# Patient Record
Sex: Female | Born: 1970 | Race: Black or African American | Hispanic: No | Marital: Married | Smoking: Never smoker
Health system: Southern US, Community
[De-identification: ages and names within clinical notes are randomized; demographics above are authoritative.]

## PROBLEM LIST (undated history)

## (undated) DIAGNOSIS — E739 Lactose intolerance, unspecified: Secondary | ICD-10-CM

## (undated) DIAGNOSIS — S82899A Other fracture of unspecified lower leg, initial encounter for closed fracture: Secondary | ICD-10-CM

## (undated) DIAGNOSIS — M7989 Other specified soft tissue disorders: Secondary | ICD-10-CM

## (undated) DIAGNOSIS — Z780 Asymptomatic menopausal state: Secondary | ICD-10-CM

## (undated) DIAGNOSIS — K219 Gastro-esophageal reflux disease without esophagitis: Secondary | ICD-10-CM

## (undated) DIAGNOSIS — R002 Palpitations: Secondary | ICD-10-CM

## (undated) DIAGNOSIS — F329 Major depressive disorder, single episode, unspecified: Secondary | ICD-10-CM

## (undated) DIAGNOSIS — R7989 Other specified abnormal findings of blood chemistry: Secondary | ICD-10-CM

## (undated) DIAGNOSIS — F419 Anxiety disorder, unspecified: Secondary | ICD-10-CM

## (undated) DIAGNOSIS — N898 Other specified noninflammatory disorders of vagina: Secondary | ICD-10-CM

## (undated) DIAGNOSIS — T380X5A Adverse effect of glucocorticoids and synthetic analogues, initial encounter: Secondary | ICD-10-CM

## (undated) DIAGNOSIS — J329 Chronic sinusitis, unspecified: Secondary | ICD-10-CM

## (undated) DIAGNOSIS — J189 Pneumonia, unspecified organism: Secondary | ICD-10-CM

## (undated) DIAGNOSIS — E669 Obesity, unspecified: Secondary | ICD-10-CM

## (undated) DIAGNOSIS — R1310 Dysphagia, unspecified: Secondary | ICD-10-CM

## (undated) DIAGNOSIS — Z91018 Allergy to other foods: Secondary | ICD-10-CM

## (undated) DIAGNOSIS — G43909 Migraine, unspecified, not intractable, without status migrainosus: Secondary | ICD-10-CM

## (undated) DIAGNOSIS — F32A Depression, unspecified: Secondary | ICD-10-CM

## (undated) DIAGNOSIS — R0602 Shortness of breath: Secondary | ICD-10-CM

## (undated) DIAGNOSIS — M1990 Unspecified osteoarthritis, unspecified site: Secondary | ICD-10-CM

## (undated) DIAGNOSIS — R7303 Prediabetes: Secondary | ICD-10-CM

## (undated) DIAGNOSIS — R6882 Decreased libido: Secondary | ICD-10-CM

## (undated) DIAGNOSIS — K5900 Constipation, unspecified: Secondary | ICD-10-CM

## (undated) DIAGNOSIS — I10 Essential (primary) hypertension: Secondary | ICD-10-CM

## (undated) DIAGNOSIS — R739 Hyperglycemia, unspecified: Secondary | ICD-10-CM

## (undated) HISTORY — DX: Dysphagia, unspecified: R13.10

## (undated) HISTORY — DX: Lactose intolerance, unspecified: E73.9

## (undated) HISTORY — DX: Other specified soft tissue disorders: M79.89

## (undated) HISTORY — DX: Essential (primary) hypertension: I10

## (undated) HISTORY — DX: Obesity, unspecified: E66.9

## (undated) HISTORY — DX: Palpitations: R00.2

## (undated) HISTORY — DX: Constipation, unspecified: K59.00

## (undated) HISTORY — DX: Decreased libido: R68.82

## (undated) HISTORY — DX: Shortness of breath: R06.02

## (undated) HISTORY — DX: Prediabetes: R73.03

## (undated) HISTORY — DX: Chronic sinusitis, unspecified: J32.9

## (undated) HISTORY — DX: Allergy to other foods: Z91.018

## (undated) HISTORY — DX: Other specified noninflammatory disorders of vagina: N89.8

## (undated) HISTORY — DX: Asymptomatic menopausal state: Z78.0

## (undated) HISTORY — PX: WISDOM TOOTH EXTRACTION: SHX21

---

## 1999-09-11 ENCOUNTER — Other Ambulatory Visit: Admission: RE | Admit: 1999-09-11 | Discharge: 1999-09-11 | Payer: Self-pay | Admitting: Family Medicine

## 1999-09-30 ENCOUNTER — Encounter: Admission: RE | Admit: 1999-09-30 | Discharge: 1999-12-29 | Payer: Self-pay | Admitting: Family Medicine

## 2000-04-27 ENCOUNTER — Encounter: Payer: Self-pay | Admitting: Family Medicine

## 2000-04-27 ENCOUNTER — Ambulatory Visit (HOSPITAL_COMMUNITY): Admission: RE | Admit: 2000-04-27 | Discharge: 2000-04-27 | Payer: Self-pay | Admitting: Family Medicine

## 2001-01-05 ENCOUNTER — Other Ambulatory Visit: Admission: RE | Admit: 2001-01-05 | Discharge: 2001-01-05 | Payer: Self-pay | Admitting: Obstetrics and Gynecology

## 2003-07-31 ENCOUNTER — Emergency Department (HOSPITAL_COMMUNITY): Admission: EM | Admit: 2003-07-31 | Discharge: 2003-07-31 | Payer: Self-pay | Admitting: *Deleted

## 2004-02-18 ENCOUNTER — Inpatient Hospital Stay (HOSPITAL_COMMUNITY): Admission: AD | Admit: 2004-02-18 | Discharge: 2004-02-18 | Payer: Self-pay | Admitting: Gynecology

## 2004-10-02 ENCOUNTER — Inpatient Hospital Stay (HOSPITAL_COMMUNITY): Admission: AD | Admit: 2004-10-02 | Discharge: 2004-10-07 | Payer: Self-pay | Admitting: Obstetrics and Gynecology

## 2004-10-04 ENCOUNTER — Encounter (INDEPENDENT_AMBULATORY_CARE_PROVIDER_SITE_OTHER): Payer: Self-pay | Admitting: *Deleted

## 2004-10-08 ENCOUNTER — Encounter: Admission: RE | Admit: 2004-10-08 | Discharge: 2004-11-07 | Payer: Self-pay | Admitting: Obstetrics and Gynecology

## 2005-10-25 ENCOUNTER — Emergency Department (HOSPITAL_COMMUNITY): Admission: EM | Admit: 2005-10-25 | Discharge: 2005-10-25 | Payer: Self-pay | Admitting: Emergency Medicine

## 2006-10-06 DIAGNOSIS — S82899A Other fracture of unspecified lower leg, initial encounter for closed fracture: Secondary | ICD-10-CM

## 2006-10-06 HISTORY — DX: Other fracture of unspecified lower leg, initial encounter for closed fracture: S82.899A

## 2006-10-29 ENCOUNTER — Emergency Department (HOSPITAL_COMMUNITY): Admission: EM | Admit: 2006-10-29 | Discharge: 2006-10-29 | Payer: Self-pay | Admitting: Emergency Medicine

## 2007-08-19 ENCOUNTER — Encounter: Admission: RE | Admit: 2007-08-19 | Discharge: 2007-08-19 | Payer: Self-pay | Admitting: Family Medicine

## 2008-07-31 ENCOUNTER — Inpatient Hospital Stay (HOSPITAL_COMMUNITY): Admission: AD | Admit: 2008-07-31 | Discharge: 2008-08-03 | Payer: Self-pay | Admitting: Family Medicine

## 2008-10-16 DIAGNOSIS — J309 Allergic rhinitis, unspecified: Secondary | ICD-10-CM | POA: Insufficient documentation

## 2008-10-16 DIAGNOSIS — J45909 Unspecified asthma, uncomplicated: Secondary | ICD-10-CM | POA: Insufficient documentation

## 2008-10-17 ENCOUNTER — Ambulatory Visit: Payer: Self-pay | Admitting: Critical Care Medicine

## 2008-10-17 DIAGNOSIS — I10 Essential (primary) hypertension: Secondary | ICD-10-CM | POA: Insufficient documentation

## 2008-10-19 ENCOUNTER — Ambulatory Visit: Payer: Self-pay | Admitting: Internal Medicine

## 2008-10-20 LAB — CONVERTED CEMR LAB: IgE (Immunoglobulin E), Serum: 765.7 intl units/mL — ABNORMAL HIGH (ref 0.0–180.0)

## 2008-10-24 ENCOUNTER — Encounter: Payer: Self-pay | Admitting: Critical Care Medicine

## 2008-10-30 ENCOUNTER — Ambulatory Visit: Payer: Self-pay | Admitting: Critical Care Medicine

## 2008-11-02 ENCOUNTER — Encounter: Payer: Self-pay | Admitting: Critical Care Medicine

## 2008-12-15 ENCOUNTER — Ambulatory Visit: Payer: Self-pay | Admitting: Critical Care Medicine

## 2008-12-26 ENCOUNTER — Encounter: Payer: Self-pay | Admitting: Critical Care Medicine

## 2008-12-27 ENCOUNTER — Encounter: Payer: Self-pay | Admitting: Critical Care Medicine

## 2009-01-22 ENCOUNTER — Encounter: Payer: Self-pay | Admitting: Critical Care Medicine

## 2009-01-24 ENCOUNTER — Encounter: Payer: Self-pay | Admitting: Critical Care Medicine

## 2009-02-02 ENCOUNTER — Telehealth: Payer: Self-pay | Admitting: Critical Care Medicine

## 2009-02-22 ENCOUNTER — Ambulatory Visit: Payer: Self-pay | Admitting: Critical Care Medicine

## 2009-02-22 DIAGNOSIS — J45909 Unspecified asthma, uncomplicated: Secondary | ICD-10-CM | POA: Insufficient documentation

## 2009-03-09 ENCOUNTER — Ambulatory Visit: Payer: Self-pay | Admitting: Critical Care Medicine

## 2009-03-23 ENCOUNTER — Ambulatory Visit: Payer: Self-pay | Admitting: Critical Care Medicine

## 2009-04-06 ENCOUNTER — Ambulatory Visit: Payer: Self-pay | Admitting: Critical Care Medicine

## 2009-04-17 ENCOUNTER — Emergency Department (HOSPITAL_COMMUNITY): Admission: EM | Admit: 2009-04-17 | Discharge: 2009-04-17 | Payer: Self-pay | Admitting: Emergency Medicine

## 2009-04-17 ENCOUNTER — Telehealth: Payer: Self-pay | Admitting: Internal Medicine

## 2009-04-18 ENCOUNTER — Ambulatory Visit: Payer: Self-pay | Admitting: Critical Care Medicine

## 2009-04-24 ENCOUNTER — Ambulatory Visit: Payer: Self-pay | Admitting: Critical Care Medicine

## 2009-04-24 ENCOUNTER — Encounter: Payer: Self-pay | Admitting: Adult Health

## 2009-05-16 ENCOUNTER — Ambulatory Visit: Payer: Self-pay | Admitting: Critical Care Medicine

## 2009-06-01 ENCOUNTER — Ambulatory Visit: Payer: Self-pay | Admitting: Critical Care Medicine

## 2009-09-26 ENCOUNTER — Telehealth (INDEPENDENT_AMBULATORY_CARE_PROVIDER_SITE_OTHER): Payer: Self-pay | Admitting: *Deleted

## 2009-10-11 ENCOUNTER — Encounter (INDEPENDENT_AMBULATORY_CARE_PROVIDER_SITE_OTHER): Payer: Self-pay | Admitting: *Deleted

## 2009-11-23 ENCOUNTER — Ambulatory Visit: Payer: Self-pay | Admitting: Critical Care Medicine

## 2009-12-25 ENCOUNTER — Telehealth (INDEPENDENT_AMBULATORY_CARE_PROVIDER_SITE_OTHER): Payer: Self-pay | Admitting: *Deleted

## 2010-03-04 ENCOUNTER — Ambulatory Visit: Payer: Self-pay | Admitting: Critical Care Medicine

## 2010-03-04 DIAGNOSIS — J018 Other acute sinusitis: Secondary | ICD-10-CM | POA: Insufficient documentation

## 2010-03-06 ENCOUNTER — Telehealth (INDEPENDENT_AMBULATORY_CARE_PROVIDER_SITE_OTHER): Payer: Self-pay | Admitting: *Deleted

## 2010-03-19 ENCOUNTER — Ambulatory Visit: Payer: Self-pay | Admitting: Critical Care Medicine

## 2010-05-10 ENCOUNTER — Ambulatory Visit: Payer: Self-pay | Admitting: Internal Medicine

## 2010-05-20 IMAGING — CT CT PARANASAL SINUSES LIMITED
1 of 3 series · 13 of 30 positions shown, 17 images · non-contrast
Comparison: None

CLINICAL DATA: Recurrent sinus infection

CT LIMITED SINUSES WITHOUT CONTRAST
TECHNIQUE: Multidetector CT images of the paranasal sinuses were
obtained in a single plane without contrast.

[Series 7: ltd sinus 3.0 h30s · axial · 0.29mm/px · z∈[-100,-5]mm · 13 of 24 slices shown, 17 images]
[im 2/24  brain]
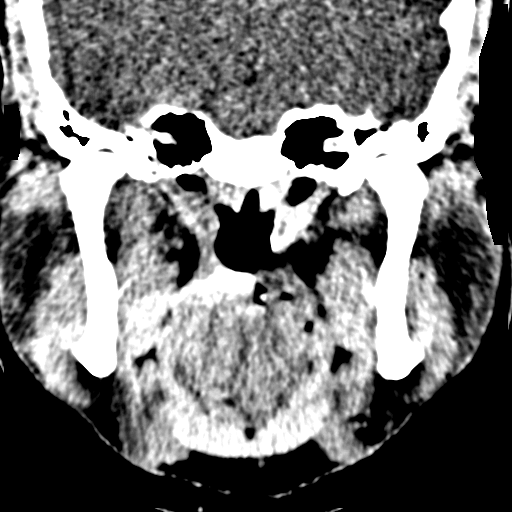
[im 2/24  bone]
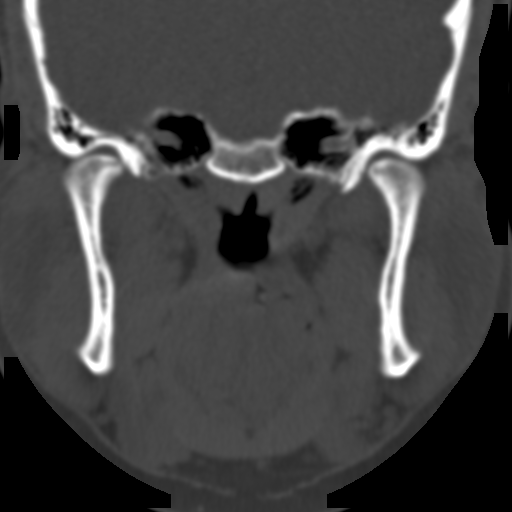
[im 4/24  bone]
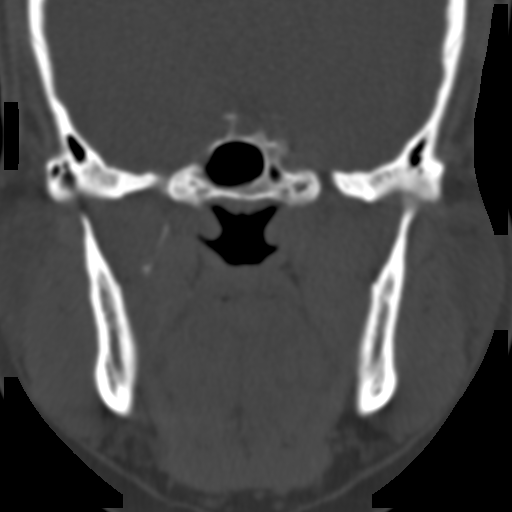
[im 5/24  bone]
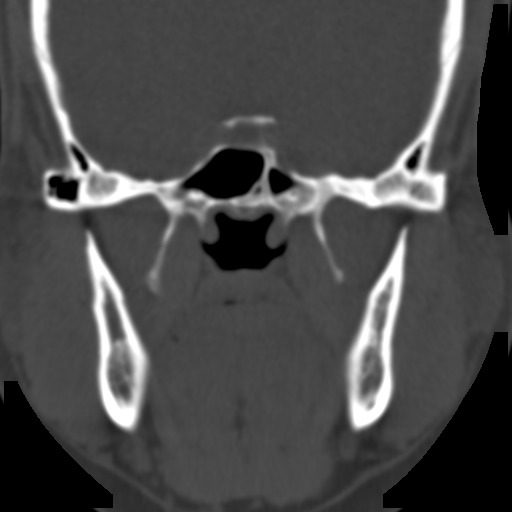
[im 7/24  bone]
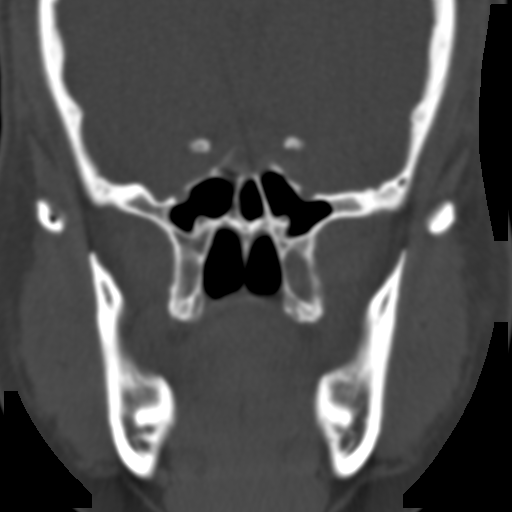
[im 9/24  brain]
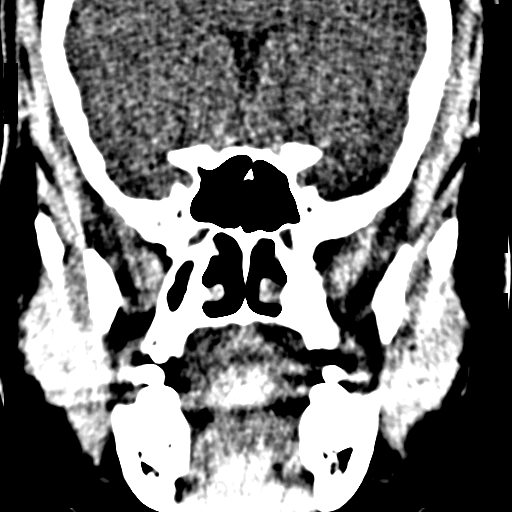
[im 9/24  bone]
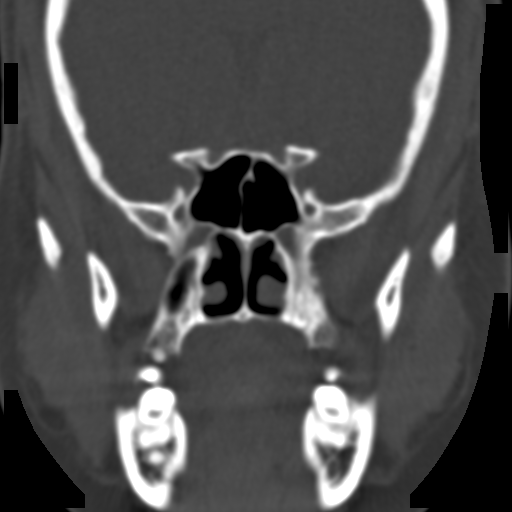
[im 10/24  bone]
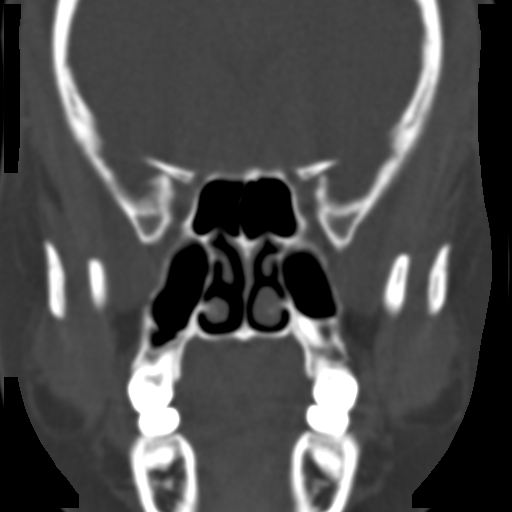
[im 12/24  bone]
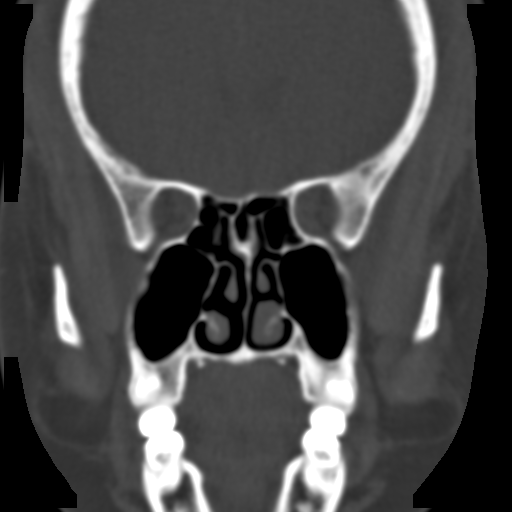
[im 14/24  bone]
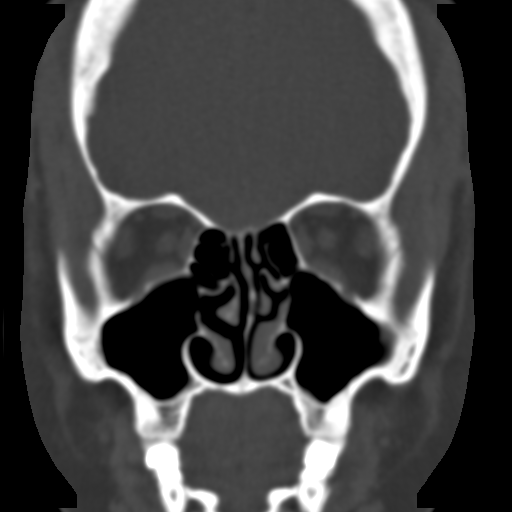
[im 15/24  brain]
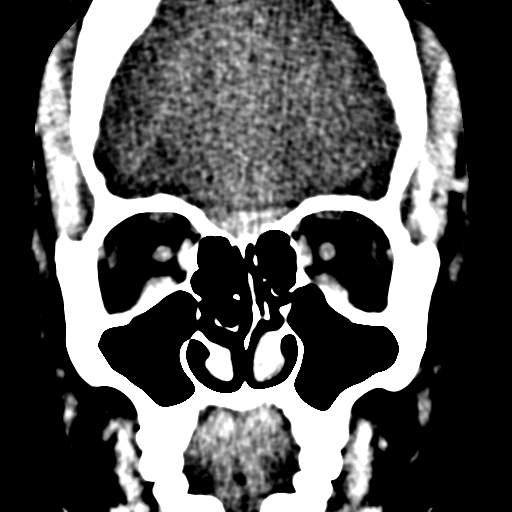
[im 15/24  bone]
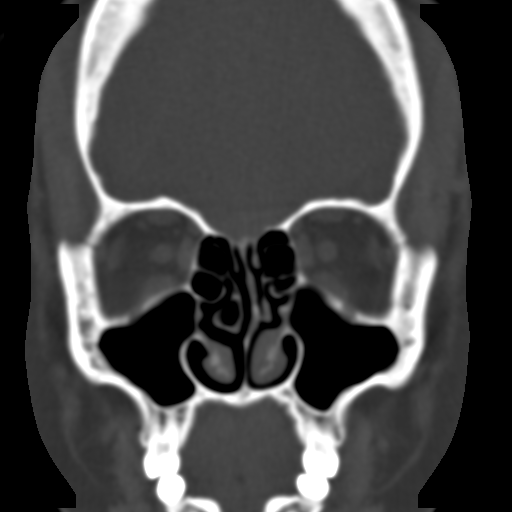
[im 17/24  bone]
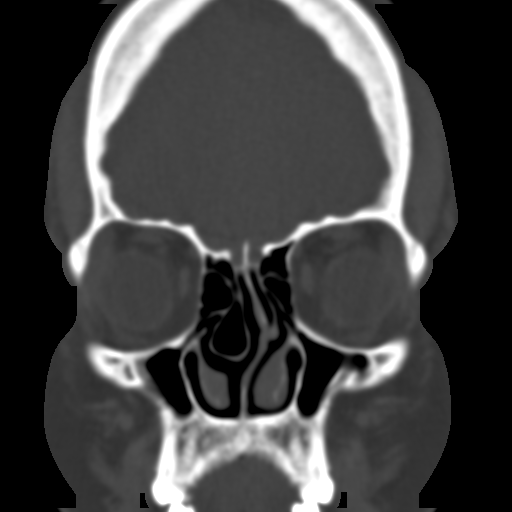
[im 19/24  bone]
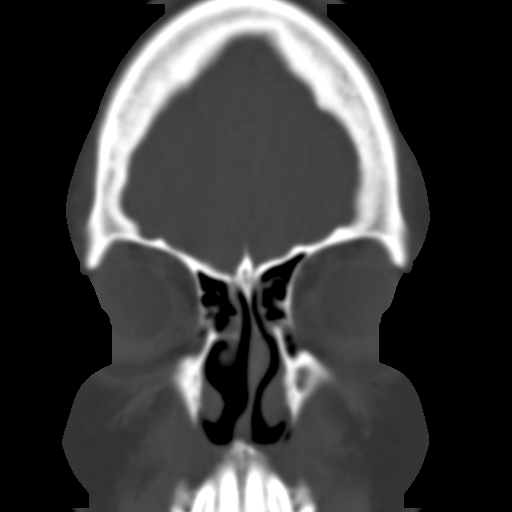
[im 20/24  bone]
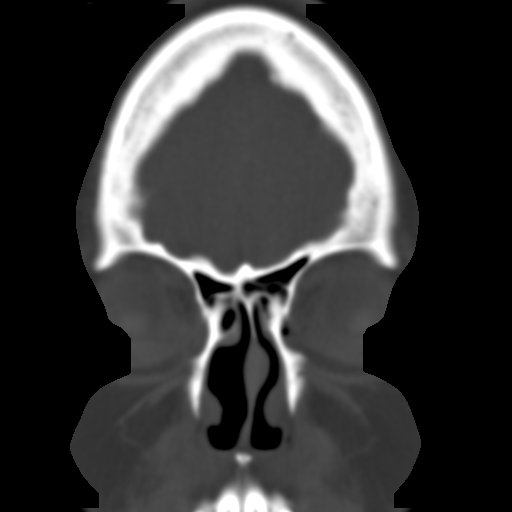
[im 22/24  brain]
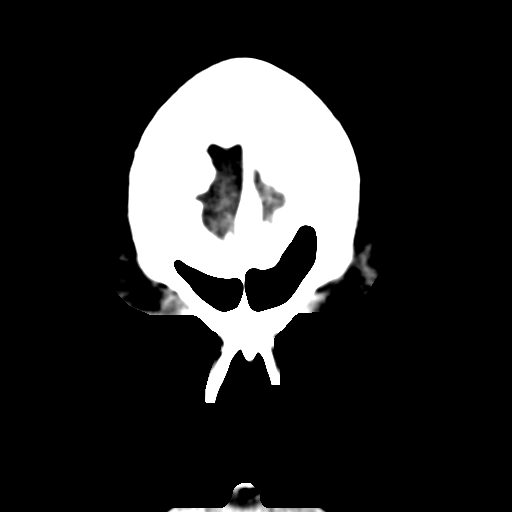
[im 22/24  bone]
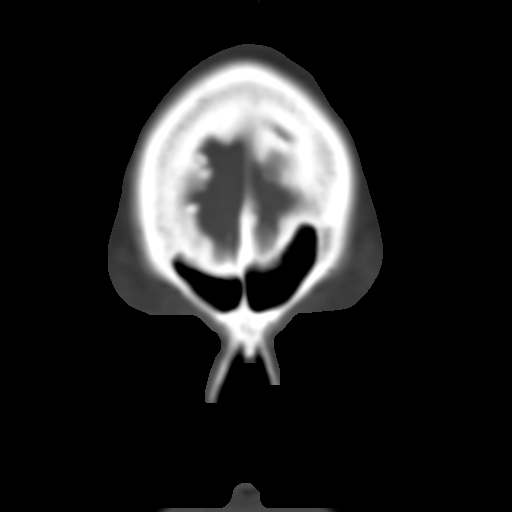

[13 of 30 positions shown; findings below may reference images not displayed]

FINDINGS: Limited evaluation of the sinuses were performed.  The
paranasal sinuses are well aerated and developed.  There is no
mucosal thickening.  Negative for air fluid level or retention
cyst.  There is no fracture or bony mass lesion.
IMPRESSION: Normal limited CT of the paranasal sinuses.

## 2010-08-06 NOTE — Miscellaneous (Signed)
Summary: Orders Update  Clinical Lists Changes  Orders: Added new Referral order of Misc. Referral (Misc. Ref) - Signed 

## 2010-08-06 NOTE — Progress Notes (Signed)
Summary: Sylvia [name]  Phone Note Outgoing Call   Call placed by: T.Scott Call placed to: Patient Details for Reason: No response! Summary of Call: Dr.Wright, Thought you should know [name] has not been complaint with her xolair. She's had six shots(first shot 02-22-09,last shot 06-01-09) We have left messages and sent her a letter with no response. Please advise. Initial call taken by: Dimas Millin,  September 26, 2009 9:54 AM  Follow-up for Phone Call        Pls d/c all further xolair orders  pls schedule an OV soon Follow-up by: Storm Frisk MD,  September 26, 2009 10:53 AM  Additional Follow-up for Phone Call Additional follow up Details #1::        lmom for pt to call to schedule appt.  Eugene Gavia  September 27, 2009 4:53 PM  lmom at work number for pt to call and schedule appt with [facility].   Additional Follow-up by: Eugene Gavia,  October 02, 2009 8:28 AM    Additional Follow-up for Phone Call Additional follow up Details #2::    lmom for pt to call back & schedule appt.  Eugene Gavia  October 05, 2009 8:27 AM  Letterprinted and sent to ask pt to call us for an appointment.    Follow-up by: Eugene Gavia,  October 11, 2009 1:18 PM

## 2010-08-06 NOTE — Assessment & Plan Note (Signed)
Summary: Pulmonary OV   Copy to:  Dr. [place] Callas Primary Provider/Referring Provider:  Dr. Shaune Pollack  CC:  Acute Visit.  c/o increased cough, wheezing, SOB at rest, sinus pressure, nausea, HA, and PND x 1 wk.  Cough is now prod with light yellow mucus.  using rescue HFA more frequently over the weekend.  temp 101 last Wednesday.Sylvia [name]  History of Present Illness: 40 year old female with severe persistent asthma with allergic, obesity, GERD, sinus dz factors Lifelong asthma.  PPT factors: dusts, fumes, cig smoke, allergens, acid reflux, anxiety, sinus dz.  ImmunoRx per [place] Callas for 3years without help and stopped in2008 Never had IgE checked.  Lifelong never smoker.  Best ever PFR 450  is bad when it gets to 300-350.   Nov 23, 2009 9:07 AM asthma f/u : The pt is doing fairly well,  allergy season is an issue,  overall ok.  No real dyspnea.  No cough. No chest pain.   Notes mild pn drip.  Only using SABA pre exercise. Pt denies any significant sore throat, nasal congestion or excess secretions, fever, chills, sweats, unintended weight loss, pleurtic or exertional chest pain, orthopnea PND, or leg swelling Pt denies any increase in rescue therapy over baseline, denies waking up needing it or having any early am or nocturnal exacerbations of coughing/wheezing/or dyspnea.   March 04, 2010 10:01 AM ? sinus issues.  noting illness x 1week. ? allergies at first.  symptoms now: pressure in sinus and headaches, dizzy, nausea.  At night , more coughing and more use of rescue inhaler use twice at night. Notes more dyspnea esp at night.  Feels draggy and fatigued. Wants to lay down. Had low grade fever last week.  Cough is productive clear and occ pieces of dark yellow.  Clear out of nose.  No heartburn or indigestion.     Asthma History    Asthma Control Assessment:    Age range: 12+ years    Symptoms: throughout the day    Nighttime Awakenings: 4 or more/week    Interferes w/ normal activity: some  limitations    SABA use (not for EIB): several times per day    ATAQ questionnaire: 3-4    FEV1: 2.48 liters (today)    FEV1 Pred: 2.84 liters (today)    Exacerbations requiring oral systemic steroids: 0-1/year    Asthma Control Assessment: Very Poorly Controlled   Preventive Screening-Counseling & Management  Alcohol-Tobacco     Alcohol drinks/day: 0     Smoking Status: never  Current Medications (verified): 1)  Singulair 10 Mg Tabs (Montelukast Sodium) .Sylvia [name].. 1 By Mouth At Bedtime 2)  Zyrtec Allergy 10 Mg Tbdp (Cetirizine Hcl) .... Take 1 Tablet By Mouth Once A Day 3)  Symbicort 160-4.5 Mcg/act Aero (Budesonide-Formoterol Fumarate) .... 2 Puffs Two Times A Day 4)  Flonase 50 Mcg/act Susp (Fluticasone Propionate) .... 2 Sprays in Each Nostril Daily 5)  Xopenex Hfa 45 Mcg/act Aero (Levalbuterol Tartrate) .... As Needed 6)  Xopenex 1.25 Mg/41ml Nebu (Levalbuterol Hcl) .Sylvia [name].. 1 Vial Up To Three Times A Day in Nebulizer As Needed 7)  Hydrochlorothiazide 25 Mg Tabs (Hydrochlorothiazide) .Sylvia [name].. 1 By Mouth Daily 8)  Kaon-Cl-10 10 Meq Cr-Tabs (Potassium Chloride) .Sylvia [name].. 1 By Mouth Daily 9)  Qvar 80 Mcg/act Aers (Beclomethasone Dipropionate) .... Inhale 2 Puffs Two Times A Day  Allergies (verified): 1)  ! * Latex  Past History:  Past medical, surgical, family and social histories (including risk factors) reviewed, and no changes noted (except  as noted below).  Past Medical History: Reviewed history from 10/17/2008 and no changes required. Current Problems:  ASTHMA (ICD-493.90) ALLERGIC RHINITIS (ICD-477.9) Hypertension  Past Surgical History: Reviewed history from 10/17/2008 and no changes required. Csection oral surgery  Family History: Reviewed history from 10/17/2008 and no changes required. Family History Asthma mother Family History Coronary Heart Disease  MGM  Clotting disorder MGM  Social History: Reviewed history from 10/17/2008 and no changes required. 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  The patient denies coughing up blood, chest pain, irregular heartbeats, acid heartburn, indigestion, loss of appetite, weight change, abdominal pain, difficulty swallowing, sore throat, tooth/dental problems, sneezing, itching, ear ache, anxiety, depression, hand/feet swelling, joint stiffness or pain, rash, and fever.    Vital Signs:  Patient profile:   40 year old female Height:      63.5 inches Weight:      266.13 pounds BMI:     46.57 O2 Sat:      99 % on Room air Temp:     98.3 degrees F oral Pulse rate:   97 / minute BP sitting:   140 / 80  (left arm) Cuff size:   large  Vitals Entered By: Gweneth Dimitri RN (March 04, 2010 9:51 AM)  O2 Flow:  Room air CC: Acute Visit.  c/o increased cough, wheezing, SOB at rest, sinus pressure, nausea, HA, PND x 1 wk.  Cough is now prod with light yellow mucus.  using rescue HFA more frequently over the weekend.  temp 101 last Wednesday. Comments Medications reviewed with patient Daytime contact number verified with patient. Gweneth Dimitri RN  March 04, 2010 9:51 AM    Physical Exam  Additional Exam:  Gen: Pleasant obese , in no distress,  normal affect ENT:  mouth clear,  oropharynx clear, bilateral nares with purulence and erythema Neck: No JVD, no TMG, no carotid bruits Lungs: Coarse BS w/ no wheezing, resolved upper airway pseudowheezing.  Cardiovascular: RRR, heart sounds normal, no murmur or gallops, no peripheral edema Abdomen: soft and NT, no HSM,  BS normal, obese  Musculoskeletal: No deformities, no cyanosis or clubbing Neuro: alert, non focal Skin: Warm, no lesions or rashes    Pre-Spirometry FEV1    Value: 2.48 L     Pred:  2.84 L     Impression & Recommendations:  Problem # 1:  OTHER ACUTE SINUSITIS (ICD-461.8) Assessment Deteriorated acute sinusitis with asthma flare plan augmentin 875 two times a day x 7days , pulse prednisone, flonase two times a day for 10days neil med sinus rinse hold zyrtec for 10days then resume Her updated medication list for this problem includes:    Flonase 50 Mcg/act Susp (Fluticasone propionate) .Sylvia [name]... 2 sprays in each nostril  twice daily for 10days then reduce to once daily    Amoxicillin-pot Clavulanate 875-125 Mg Tabs (Amoxicillin-pot clavulanate) ..... One by mouth two times a day  Orders: Est. Patient Level V (04540)  Medications Added to Medication List This Visit: 1)  Zyrtec Allergy 10 Mg Tbdp (Cetirizine hcl) .... Take 1 tablet by mouth once a day 2)  Zyrtec Allergy 10 Mg Tbdp (Cetirizine hcl) .... Hold for 10days then resume 3)  Flonase 50 Mcg/act Susp (  Fluticasone propionate) .... 2 sprays in each nostril  twice daily for 10days then reduce to once daily 4)  Prednisone 10 Mg Tabs (Prednisone) .... Take as directed 4 each am x 4 days, 3 x 4 days, 2 x 4 days, 1 x 4 days then stop 5)  Amoxicillin-pot Clavulanate 875-125 Mg Tabs (Amoxicillin-pot clavulanate) .... One by mouth two times a day  Complete Medication List: 1)  Singulair 10 Mg Tabs (Montelukast sodium) .Sylvia [name].. 1 by mouth at bedtime 2)  Zyrtec Allergy 10 Mg Tbdp (Cetirizine hcl) .... Hold for 10days then resume 3)  Symbicort 160-4.5 Mcg/act Aero (Budesonide-formoterol fumarate) .... 2 puffs two times a day 4)  Flonase 50 Mcg/act Susp (Fluticasone propionate) .... 2 sprays in each nostril  twice daily for 10days then reduce to once daily 5)  Xopenex Hfa 45 Mcg/act Aero (Levalbuterol tartrate) .... As needed 6)  Xopenex 1.25 Mg/28ml Nebu (Levalbuterol hcl) .Sylvia [name].. 1 vial up to three times a day in nebulizer as needed 7)  Hydrochlorothiazide 25 Mg Tabs (Hydrochlorothiazide) .Sylvia [name].. 1 by mouth daily 8)  Kaon-cl-10 10 Meq  Cr-tabs (Potassium chloride) .Sylvia [name].. 1 by mouth daily 9)  Qvar 80 Mcg/act Aers (Beclomethasone dipropionate) .... Inhale 2 puffs two times a day 10)  Prednisone 10 Mg Tabs (Prednisone) .... Take as directed 4 each am x 4 days, 3 x 4 days, 2 x 4 days, 1 x 4 days then stop 11)  Amoxicillin-pot Clavulanate 875-125 Mg Tabs (Amoxicillin-pot clavulanate) .... One by mouth two times a day  Patient Instructions: 1)  Prednisone 10mg  4 each am x 4 days, 3 x 4 days, 2 x 4 days, 1 x 4 days then stop 2)  Flonase two sprays each nostril twice daily for 10days then reduce to once daily 3)  Hold zyrtec for 10days then resume 4)  Augmentin one twice daily for 7days 5)  Stay on symbicort and qvar  6)  Use the NeilMed nasal rinse at least daily washing out both nares thoroughly.  Place one packet of Sinus Wash ingredients into the nasal wash bottle then fill to the dotted line with lukewarm tap water.  Lean over the sink and rinse each nostril out thoroughly and avoid letting the rinse go into the throat.   7)  Return in 2 weeks for recheck Prescriptions: SYMBICORT 160-4.5 MCG/ACT AERO (BUDESONIDE-FORMOTEROL FUMARATE) 2 puffs two times a day  #1 x 6   Entered by:   Gweneth Dimitri RN   Authorized by:   Storm Frisk MD   Signed by:   Gweneth Dimitri RN on 03/04/2010   Method used:   Electronically to        [facility] [phone]* (retail)       [address]       [place], [place]  [address]       Ph: [phone] or [phone]       Fax: [phone]   RxID:   [id number] AMOXICILLIN-POT CLAVULANATE 875-125 MG TABS (AMOXICILLIN-POT CLAVULANATE) One by mouth two times a day  #14 x 0   Entered and Authorized by:   Storm Frisk MD   Signed by:   Storm Frisk MD on 03/04/2010   Method used:   Electronically to        [facility] [phone]* (retail)       [address]       [place], [place]  [address]       Ph: [phone] or [phone]  Fax: [phone]   RxID:   [id number] PREDNISONE 10 MG   TABS (PREDNISONE) Take as directed 4 each am x 4 days, 3 x 4 days, 2 x 4 days, 1 x 4 days then stop  #40 x 0   Entered and Authorized by:   Storm Frisk MD   Signed by:   Storm Frisk MD on 03/04/2010   Method used:   Electronically to        [facility] [phone]* (retail)       [address]       [place], [place]  [address]       Ph: [phone] or [phone]       Fax: [phone]   RxID:   [id number]   Appended Document: Pulmonary OV fax Shaune Pollack

## 2010-08-06 NOTE — Assessment & Plan Note (Signed)
Summary: Pulmonary OV   Copy to:  Dr. [place] Callas Primary Provider/Referring Provider:  Dr. Shaune Pollack  CC:  6 month follow up.  Pt states she has been using xopenex HFA more frequently over the past 4 wks due to SOB and chest tightness.  Pt also c/o itchy eyes and sneezing over the past couple months-relates this to allergies.  .  History of Present Illness: 40 year old female with severe persistent asthma with allergic, obesity, GERD, sinus dz factors Lifelong asthma.  PPT factors: dusts, fumes, cig smoke, allergens, acid reflux, anxiety, sinus dz.  ImmunoRx per [place] Callas for 3years without help and stopped in2008 Never had IgE checked.  Lifelong never smoker.  Best ever PFR 450  is bad when it gets to 300-350.   Nov 23, 2009 9:07 AM asthma f/u : The pt is doing fairly well,  allergy season is an issue,  overall ok.  No real dyspnea.  No cough. No chest pain.   Notes mild pn drip.  Only using SABA pre exercise. Pt denies any significant sore throat, nasal congestion or excess secretions, fever, chills, sweats, unintended weight loss, pleurtic or exertional chest pain, orthopnea PND, or leg swelling Pt denies any increase in rescue therapy over baseline, denies waking up needing it or having any early am or nocturnal exacerbations of coughing/wheezing/or dyspnea.      Asthma History    Asthma Control Assessment:    Age range: 12+ years    Symptoms: 0-2 days/week    Nighttime Awakenings: 0-2/month    Interferes w/ normal activity: some limitations    SABA use (not for EIB): 0-2 days/week    ATAQ questionnaire: 0    FEV1: 2.48 liters (today)    FEV1 Pred: 2.84 liters (today)    Exacerbations requiring oral systemic steroids: 0-1/year    Asthma Control Assessment: Not Well Controlled   Preventive Screening-Counseling & Management  Alcohol-Tobacco     Smoking Status: never  Current Medications (verified): 1)  Singulair 10 Mg Tabs (Montelukast Sodium) .Marland Kitchen.. 1 By Mouth At Bedtime 2)   Clarinex 5 Mg Tabs (Desloratadine) .Marland Kitchen.. 1 By Mouth Daily 3)  Symbicort 160-4.5 Mcg/act Aero (Budesonide-Formoterol Fumarate) .... 2 Puffs Two Times A Day 4)  Flonase 50 Mcg/act Susp (Fluticasone Propionate) .... 2 Sprays in Each Nostril Daily 5)  Zoloft 20 Mg/ml Conc (Sertraline Hcl) .Marland Kitchen.. 1 By Mouth Daily 6)  Xopenex Hfa 45 Mcg/act Aero (Levalbuterol Tartrate) .... As Needed 7)  Xopenex 1.25 Mg/75ml Nebu (Levalbuterol Hcl) .Marland Kitchen.. 1 Vial Up To Three Times A Day in Nebulizer As Needed 8)  Hydrochlorothiazide 25 Mg Tabs (Hydrochlorothiazide) .Marland Kitchen.. 1 By Mouth Daily 9)  Kaon-Cl-10 10 Meq Cr-Tabs (Potassium Chloride) .Marland Kitchen.. 1 By Mouth Daily  Allergies (verified): 1)  ! * Latex  Past History:  Past medical, surgical, family and social histories (including risk factors) reviewed, and no changes noted (except as noted below).  Past Medical History: Reviewed history from 10/17/2008 and no changes required. Current Problems:  ASTHMA (ICD-493.90) ALLERGIC RHINITIS (ICD-477.9) Hypertension  Past Surgical History: Reviewed history from 10/17/2008 and no changes required. Csection oral surgery  Family History: Reviewed history from 10/17/2008 and no changes required. Family History Asthma mother Family History Coronary Heart Disease  MGM  Clotting disorder MGM  Social History: Reviewed history from 10/17/2008 and no changes required. 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    Vital Signs:  Patient profile:   40 year old female Height:      63 inches Weight:      261 pounds BMI:     46.40 O2 Sat:      98 % on Room air Temp:     98.5 degrees F oral Pulse rate:   95 / minute BP sitting:   142 / 80  (right arm) Cuff size:   large  Vitals Entered By: Gweneth Dimitri RN (Nov 23, 2009 8:52 AM)  Nutrition Counseling: Patient's BMI is greater than 25 and therefore counseled on weight management options.  O2 Flow:  Room air CC: 6 month follow up.  Pt states she has been using xopenex HFA more frequently over the past 4 wks due to SOB and chest tightness.  Pt also c/o itchy eyes and sneezing over the past couple months-relates this to allergies.   Comments Medications reviewed with patient Daytime contact number verified with patient. Gweneth Dimitri RN  Nov 23, 2009 8:52 AM    Physical Exam  Additional Exam:  Gen: Pleasant obese , in no distress,  normal affect ENT:  mouth clear,  oropharynx clear, no postnasal drip,  Neck: No JVD, no TMG, no carotid bruits Lungs: Coarse BS w/ no wheezing, resolved upper airway pseudowheezing.  Cardiovascular: RRR, heart sounds normal, no murmur or gallops, no peripheral edema Abdomen: soft and NT, no HSM,  BS normal, obese  Musculoskeletal: No deformities, no cyanosis or clubbing Neuro: alert, non focal Skin: Warm, no lesions or rashes    Pre-Spirometry FEV1    Value: 2.48 L     Pred: 2.84 L     Impression & Recommendations:  Problem # 1:  ASTHMA (ICD-493.90) Assessment Improved  Moderate persistent asthma now improved plan No change in medications cont xolair No change in medications Return in  6 mo  Complete Medication List: 1)  Singulair 10 Mg Tabs (Montelukast sodium) .Marland Kitchen.. 1 by mouth at bedtime 2)  Clarinex 5 Mg Tabs (Desloratadine) .Marland Kitchen.. 1 by mouth daily 3)  Symbicort 160-4.5 Mcg/act Aero (Budesonide-formoterol fumarate) .... 2 puffs two times a day 4)  Flonase 50  Mcg/act Susp (Fluticasone propionate) .... 2 sprays in each nostril daily 5)  Zoloft 20 Mg/ml Conc (Sertraline hcl) .Marland Kitchen.. 1 by mouth daily 6)  Xopenex Hfa 45 Mcg/act Aero (Levalbuterol tartrate) .... As needed 7)  Xopenex 1.25 Mg/79ml Nebu (Levalbuterol hcl) .Marland Kitchen.. 1 vial up to three times a day in nebulizer as needed 8)  Hydrochlorothiazide 25 Mg Tabs (Hydrochlorothiazide) .Marland Kitchen.. 1 by mouth daily 9)  Kaon-cl-10 10 Meq Cr-tabs (Potassium chloride) .Marland Kitchen.. 1 by mouth daily  Other Orders: Est. Patient Level III ([address]) Prescription Created Electronically [phone])  Patient Instructions: 1)  No change in medications 2)  Return in     6     months Prescriptions: CLARINEX 5 MG TABS (DESLORATADINE) 1 by mouth daily  #30 x 6   Entered and Authorized by:   Storm Frisk MD   Signed by:   Storm Frisk MD on 11/23/2009   Method used:   [facility] to  [id number] ([facility]ail)       [address]       [place], [place]  [address]       Ph: [phone] or [phone]       Fax: [phone]   RxID:   [id number] SINGULAIR 10 MG TABS (MONTELUKAST SODIUM) 1 by mouth at bedtime  #30 x 6   Entered and Authorized by:   Storm Frisk MD   Signed by:   Storm Frisk MD on 11/23/2009   Method used:   Electronically to        [facility] [phone]* (retail)       [address]       [place], [place]  [address]       Ph: [phone] or [phone]       Fax: [phone]   RxID:   [id number]     Appended Document: Pulmonary OV fax Zitlaly gates

## 2010-08-06 NOTE — Letter (Signed)
SummaryScience writer Pulmonary Care Appointment Letter  [facility]  [address]   [place], [place] [address]   Phone: [phone]  Fax: [phone]    10/11/2009 MRN: [id number]  Sylvia [name] [address] [place], [place]  [address]  Dear [name],   Our office is attempting to contact you about an appointment.  Please call our office at [phone] to schedule this appointment with Dr.Patrick Delford Field.  It's time for a follow up with Dr. Delford Field and to discuss your xolair injections.  Our registration staff is prepared to assist you with any questions you may have.    Thank you,   Nature conservation officer Pulmonary Division

## 2010-08-06 NOTE — Progress Notes (Signed)
Summary: wheezing/ nose bleeds  Phone Note Call from Patient   Caller: Patient Call For: WRIGHT Summary of Call: pt saw dr Delford Field 8/29. she has been using nettie pot as instructed. now having nose bleeds/ headaches/ nausea. also wheezing "a good deal" at night. no fever.  pls advise. cell [id number] Initial call taken by: Tivis Ringer, CNA,  March 06, 2010 11:26 AM  Follow-up for Phone Call        called spoke with patient who states since beginning the nettie pot she has begun having nose bleeds and her nasuea and HA have worsened.  pt also states that the wheezing has worsened at night with no change in her cough.  pt began the pred taper and abx as directed at OV.  please advise, thanks!  ALLERGIES: latex Follow-up by: Boone Master CNA/MA,  March 06, 2010 11:39 AM  Additional Follow-up for Phone Call Additional follow up Details #1::        stay on neti pot  stop flonase  Additional Follow-up by: Storm Frisk MD,  March 06, 2010 12:39 PM    Additional Follow-up for Phone Call Additional follow up Details #2::    Spoke with pt and advised that she should stay on netti pot but d/c the flonase. Pt verbalized understanding. Follow-up by: Vernie Murders,  March 06, 2010 12:44 PM

## 2010-08-06 NOTE — Miscellaneous (Signed)
Summary: Epi pen  Clinical Lists Changes  Medications: Added new medication of EPIPEN 0.3 MG/0.3ML (1:1000) DEVI (EPINEPHRINE HCL (ANAPHYLAXIS)) use as directed - Signed Rx of EPIPEN 0.3 MG/0.3ML (1:1000) DEVI (EPINEPHRINE HCL (ANAPHYLAXIS)) use as directed;  #1 x 6;  Signed;  Entered by: Storm Frisk MD;  Authorized by: Storm Frisk MD;  Method used: Print then Give to Patient    Prescriptions: EPIPEN 0.3 MG/0.3ML (1:1000) DEVI (EPINEPHRINE HCL (ANAPHYLAXIS)) use as directed  #1 x 6   Entered and Authorized by:   Storm Frisk MD   Signed by:   Storm Frisk MD on 10/24/2008   Method used:   Print then Give to Patient   RxID:   [id number]

## 2010-08-06 NOTE — Progress Notes (Signed)
Summary: xolair-req to speak to rhonda  Phone Note Call from Patient Call back at [phone]   Caller: Patient Call For: wright Summary of Call: need to know if xolair have arrived Initial call taken by: Rickard Patience,  February 02, 2009 9:41 AM  Follow-up for Phone Call        Called pt. 02-05-2009 a.m.Marland Kitchen Left a message:Yes, your Geoffry Paradise has come in. I asked her to call [phone] to make an appt. or ask for [facility].  pt requests to speak to rhonda [phone]. Tivis Ringer  February 06, 2009 2:36 PM Follow-up by: Tammy Scott  Additional Follow-up for Phone Call Additional follow up Details #1::        Pt needed an appt to get 1st xolair injection as well as a follow up appt 2 month recheck for her asthma. Appt scheduled for 02/22/09 at 2:50. Pt aware of appt and that she will need to bring her epi-pen with her to this appt and it will also be a 2 hour wait. Pt aware. Rhonda Cobb  February 06, 2009 3:43 PM

## 2010-08-06 NOTE — Medication Information (Signed)
Summary: P Auth [facility]ME[facility]OLE til 01/22/10/[facility]  P Auth [facility]   Imported By: Lester Luquillo 02/05/2009 08:46:11  _____________________________________________________________________  External Attachment:    Type:   Image     Comment:   External Document

## 2010-08-06 NOTE — Letter (Signed)
Summary: [employer]  [address]   [place], [place] [address]   Phone: [phone]  Fax: [phone]    Today's Date: May 10, 2010  Name of Patient: Sylvia [name]  The above named patient had a medical visit today at:  1:45 pm.  Please take this into consideration when reviewing the time away from work/school.    Special Instructions:  [  ] None  [  ] To be off the remainder of today, returning to the normal work / school schedule tomorrow.  [  ] To be off until the next scheduled appointment on ______________________.  [  X] Other  Please consider this as patient was away from work Wednesday through Friday.   Sincerely yours,     Jason Coop

## 2010-08-06 NOTE — Medication Information (Signed)
Summary: [employer]   Imported By: Lehman Prom 01/24/2009 14:10:59  _____________________________________________________________________  External Attachment:    Type:   Image     Comment:   External Document

## 2010-08-06 NOTE — Assessment & Plan Note (Signed)
Summary: xolair/ mbw   Allergies: 1)  ! * Latex   Other Orders: Admin of patients own med IM/SQ (16109U)   Medication Administration  Injection # 1:    Medication: Xolair (omalizumab) 150mg     Diagnosis: EXTRINSIC ASTHMA, UNSPECIFIED (ICD-493.00)    Route: SQ    Site: R deltoid    Exp Date: 03/2012    Lot #: [id number]    Mfr: Mendel Ryder    Comments: Injection given by Drucie Opitz, CMA in allergy lab. Xolair 375mg . 1.7ml x 2 in Right and 1.72ml x 1 in Left Deltoid. Pt waited 30 minutes.     Patient tolerated injection without complications  Orders Added: 1)  Admin of patients own med IM/SQ [[id number]]

## 2010-08-06 NOTE — Assessment & Plan Note (Signed)
Summary: Sylvia [name]   Allergies: 1)  ! * Latex   Other Orders: Admin of patients own med IM/SQ (16109U)   Medication Administration  Injection # 1:    Medication: Xolair (omalizumab) 150mg     Diagnosis: EXTRINSIC ASTHMA, UNSPECIFIED (ICD-493.00)    Route: SQ    Site: R deltoid    Exp Date: 03/2012    Lot #: [id number]    Mfr: Ouida Sills    Comments: Injection given by Dimas Millin in allergy lab. Xolair 375mg . 1.35ml x 2 in Right and 1.68ml x 1 in Left Deltoid. Pt waited 30 minutes.     Patient tolerated injection without complications  Orders Added: 1)  Admin of patients own med IM/SQ [[id number]]

## 2010-08-06 NOTE — Assessment & Plan Note (Signed)
Summary: xolair/apc  Nurse Visit   Allergies: 1)  ! * Latex  Medication Administration  Injection # 1:    Medication: Xolair (omalizumab) 150mg     Diagnosis: EXTRINSIC ASTHMA, UNSPECIFIED (ICD-493.00)    Route: SQ    Site: L deltoid    Exp Date: 05/2012    Lot #: [id number]    Mfr: Mendel Ryder    Comments: Inection given by Clarise Cruz in allergy lab. Xolair 375mg . 1.3ml x 2 in Left and 1.50ml x 1 in Right Deltoid. Pt waited 30 minutes.     Patient tolerated injection without complications  Orders Added: 1)  Admin of patients own med IM/SQ [[id number]]

## 2010-08-06 NOTE — Miscellaneous (Signed)
Summary: Work Letter  Clinical Lists Changes To whom it may concern:   Ms Sylvia [name] has severe persistent asthma and is susceptible to a variety of environmental factors including infections.  She would benefit from being placed into an area of lower traffic volume if that is possible in her workplace.   Sincerely yours       Shan Levans MD

## 2010-08-06 NOTE — Progress Notes (Signed)
Summary: Clarinex Prior Auth  Phone Note Outgoing Call   Call placed by: Gweneth Dimitri RN,  December 25, 2009 3:18 PM Summary of Call: received request from from [facility] to initiate prior auth for clarinex 5mg .  Called [facility] to initiate prior auth.  Awaiting form to be faxed to triage.  Case ID # [id number] Initial call taken by: Gweneth Dimitri RN,  December 25, 2009 3:19 PM  Follow-up for Phone Call        Form received and placed in PW's to do folder.  Gweneth Dimitri RN  December 25, 2009 3:58 PM  Form completed by PW and faxed back to [employer].  Form placed in triage with awaiting response forms.  Gweneth Dimitri RN  December 26, 2009 5:16 PM   Prior Berkley Harvey has been denied for Clarinex.  Received denial paperwork from [employer] and put in Crystal's look at to give to PW.  Aundra Millet Reynolds LPN  December 28, 2009 8:56 AM   Additional Follow-up for Phone Call Additional follow up Details #1::        Denial Paperwork for Clarinex placed in PW's folder for review.  WIll forward message to him as FYI.  Gweneth Dimitri RN  December 28, 2009 1:33 PM     Additional Follow-up for Phone Call Additional follow up Details #2::    noted she will have to buy antihistamine over the counter Follow-up by: Storm Frisk MD,  December 28, 2009 1:52 PM  Additional Follow-up for Phone Call Additional follow up Details #3:: Details for Additional Follow-up Action Taken: [facility] Vernie Murders  December 28, 2009 1:57 PM  LMTCB Vernie Murders  December 31, 2009 3:10 PM  Spoke with pt.  Pt informed clarinex was denied and per PW, she will have to buy otc antihistamine.  She verbalized understanding.  Gweneth Dimitri RN  December 31, 2009 4:34 PM

## 2010-08-06 NOTE — Assessment & Plan Note (Signed)
Summary: cough,congestion,wheezing since monday/mhh   Copy to:  Dr. [place] Callas Primary Provider/Referring Provider:  Dr. Shaune Pollack  CC:  Acute visit-nasal/chest congestion, cough-brown in color, and SOB with activity and wheezing. Slight fever earlier in the week. Marland Kitchen  History of Present Illness: History of Present Illness: 40 year old female with severe persistent asthma with allergic, obesity, GERD, sinus dz factors Lifelong asthma.  PPT factors: dusts, fumes, cig smoke, allergens, acid reflux, anxiety, sinus dz.  ImmunoRx per [place] [facility] for 3years without help and stopped in2008 Never had IgE checked.  Lifelong never smoker.  Best ever PFR 450  is bad when it gets to 300-350.  Nov 23, 2009 Asthma, Allergic rhinitis asthma f/u : The pt is doing fairly well,  allergy season is an issue,  overall ok.  No real dyspnea.  No cough. No chest pain.   Notes mild pn drip.  Only using SABA pre exercise. Pt denies any significant sore throat, nasal congestion or excess secretions, fever, chills, sweats, unintended weight loss, pleurtic or exertional chest pain, orthopnea PND, or leg swelling Pt denies any increase in rescue therapy over baseline, denies waking up needing it or having any early am or nocturnal exacerbations of coughing/wheezing/or dyspnea.  May 10, 2010-  Asthma, Allergic Rhinitis.....................  Husband here Acute visit in lieu of Dr Delford Field today. Nurse-CC: Acute visit-nasal/chest congestion,cough-brown in color, SOB with activity and wheezing. Slight fever earlier in the week.  For last week has had more nasal and chest congestion Had been chilled on children's field trip. . Using Mucinex and Neti pot. More DOE climbing stairs or reading to her children. Wheeze and productive cough. Left ear pain last week now better. Epistaxis. No recent PFT check. Usually prednisone helps this.   Using rescue meds several times daily for last 2-3 days. Dark green mucus. No GI upset, no recent  antibiotics.    Asthma History    Asthma Control Assessment:    Age range: 12+ years    Symptoms: throughout the day    Nighttime Awakenings: 0-2/month    Interferes w/ normal activity: some limitations    SABA use (not for EIB): several times per day    FEV1: 2.48 liters (today)    FEV1 Pred: 2.84 liters (today)    Asthma Control Assessment: Very Poorly Controlled   Preventive Screening-Counseling & Management  Alcohol-Tobacco     Alcohol drinks/day: 0     Smoking Status: never  Current Medications (verified): 1)  Singulair 10 Mg Tabs (Montelukast Sodium) .Marland Kitchen.. 1 By Mouth At Bedtime 2)  Zyrtec Allergy 10 Mg Tbdp (Cetirizine Hcl) .... One Daily 3)  Symbicort 160-4.5 Mcg/act Aero (Budesonide-Formoterol Fumarate) .... 2 Puffs Two Times A Day 4)  Nasonex 50 Mcg/act Susp (Mometasone Furoate) .... 2 Sprays Each Nostril Two Times A Day  X 10 Days Then Once Daily 5)  Xopenex Hfa 45 Mcg/act Aero (Levalbuterol Tartrate) .... As Needed 6)  Xopenex 1.25 Mg/45ml Nebu (Levalbuterol Hcl) .Marland Kitchen.. 1 Vial Up To Three Times A Day in Nebulizer As Needed 7)  Hydrochlorothiazide 25 Mg Tabs (Hydrochlorothiazide) .Marland Kitchen.. 1 By Mouth Daily 8)  Kaon-Cl-10 10 Meq Cr-Tabs (Potassium Chloride) .Marland Kitchen.. 1 By Mouth Daily 9)  Qvar 80 Mcg/act Aers (Beclomethasone Dipropionate) .... Inhale 2 Puffs Two Times A Day  Allergies (verified): 1)  ! * Latex  Past History:  Past Medical History: Last updated: 10/17/2008 Current Problems:  ASTHMA (ICD-493.90) ALLERGIC RHINITIS (ICD-477.9) Hypertension  Past Surgical History: Last updated: 10/17/2008 Csection oral surgery  Family History: Last updated: 10/17/2008 Family History Asthma mother Family History Coronary Heart Disease  MGM  Clotting disorder MGM  Social History: Last updated: 10/17/2008 [place] [employer] in [employer] Married one daughter  no pets never smoker  Risk Factors: Alcohol Use: 0 (05/10/2010)  Risk Factors: Smoking Status: never  (05/10/2010)  Review of Systems      See HPI       The patient complains of shortness of breath with activity, productive cough, nasal congestion/difficulty breathing through nose, sneezing, and change in color of mucus.  The patient denies shortness of breath at rest, non-productive cough, coughing up blood, chest pain, irregular heartbeats, acid heartburn, indigestion, loss of appetite, weight change, abdominal pain, difficulty swallowing, sore throat, tooth/dental problems, headaches, rash, and fever.    Vital Signs:  Patient profile:   40 year old female Height:      63.5 inches Weight:      261.38 pounds BMI:     45.74 O2 Sat:      100 % on Room air Pulse rate:   96 / minute BP sitting:   144 / 82  (left arm) Cuff size:   large  Vitals Entered By: Reynaldo Minium CMA (May 10, 2010 2:02 PM)  O2 Flow:  Room air CC: Acute visit-nasal/chest congestion,cough-brown in color, SOB with activity and wheezing. Slight fever earlier in the week.    Physical Exam  Additional Exam:  Gen: Pleasant obese , in no distress,  normal affect ENT:  mouth clear,  oropharynx clear ,nasal turbinates pale shiney, esp on right, w/ clear mucus bridging Neck: No JVD, no TMG, no carotid bruits Lungs: Bilateral wheeze w/o accessory use, light cough Cardiovascular: RRR, heart sounds normal, no murmur or gallops, no peripheral edema Abdomen: soft and NT, no HSM,  BS normal, obese  Musculoskeletal: No deformities, no cyanosis or clubbing Neuro: alert, non focal Skin: Warm, no lesions or rashes    Pre-Spirometry FEV1    Value: 2.48 L     Pred: 2.84 L     Impression & Recommendations:  Problem # 1:  OTHER ACUTE SINUSITIS (ICD-461.8)  Acute rhinosinusitis- likely viral, although mucosa suggests allergy.  Tolerates augmentin well.  Her updated medication list for this problem includes:    Nasonex 50 Mcg/act Susp (Mometasone furoate) .Marland Kitchen... 2 sprays each nostril two times a day  x 10 days then once  daily    Augmentin 875-125 Mg Tabs (Amoxicillin-pot clavulanate) .Marland Kitchen... 1 two times a day  Problem # 2:  EXTRINSIC ASTHMA, UNSPECIFIED (ICD-493.00) Acute asthma exacerbation with appropriate med use this week.  Will give prednisone taper and tramadol for cough.  Medications Added to Medication List This Visit: 1)  Augmentin 875-125 Mg Tabs (Amoxicillin-pot clavulanate) .Marland Kitchen.. 1 two times a day 2)  Prednisone 10 Mg Tabs (Prednisone) .Marland Kitchen.. 1 tab four times daily x 2 days, 3 times daily x 2 days, 2 times daily x 2 days, 1 time daily x 2 days 3)  Tramadol Hcl 50 Mg Tabs (Tramadol hcl) .Marland Kitchen.. 1-2 four times a day as needed cough  Other Orders: Est. Patient Level IV (95188)  Patient Instructions: 1)  Keep scheduled appointment with Dr Delford Field- earlier if needd. 2)  Fluids, rest, mucinex  3)  Scripts for augmentin, tramadol and prednisone 4)  continue your routine meds as needed Prescriptions: TRAMADOL HCL 50 MG TABS (TRAMADOL HCL) 1-2 four times a day as needed cough  #25 x 0   Entered and Authorized  by:   Waymon Budge MD   Signed by:   Waymon Budge MD on 05/10/2010   Method used:   Print then Give to Patient   RxID:   [id number] PREDNISONE 10 MG TABS (PREDNISONE) 1 tab four times daily x 2 days, 3 times daily x 2 days, 2 times daily x 2 days, 1 time daily x 2 days  #20 x 0   Entered and Authorized by:   Waymon Budge MD   Signed by:   Waymon Budge MD on 05/10/2010   Method used:   Print then Give to Patient   RxID:   [id number] AUGMENTIN 875-125 MG TABS (AMOXICILLIN-POT CLAVULANATE) 1 two times a day  #14 x 0   Entered and Authorized by:   Waymon Budge MD   Signed by:   Waymon Budge MD on 05/10/2010   Method used:   Print then Give to Patient   RxID:   [id number]

## 2010-08-06 NOTE — Assessment & Plan Note (Signed)
Summary: Pulmonary OV   Visit Type:  Follow-up Copy to:  Dr. [place] Callas Primary Provider/Referring Provider:  Dr. Shaune Pollack  CC:  Sylvia [name] pt for 1st shot.  History of Present Illness: Pulmonary OV         This is a 40 year old female with severe persistent asthma with allergic, obesity, GERD, sinus dz factors.  Lifelong asthma.  PPT factors: dusts, fumes, cig smoke, allergens, acid reflux, anxiety, sinus dz.  ImmunoRx per [place] Callas for 3years without help and stopped 2 years ago. Never had IgE checked.  Lifelong never smoker.  Best ever PFR 450  is bad when it gets to 300-350. Last in hosp 1/10.  No ED visits since.  December 15, 2008 4:03 PM Since last ov has done well. No ED visits.  No cough or dyspnea.  No wheeze. Sl pndrip.  Using xopenex hfa once weekly. Pt denies any significant sore throat, nasal congestion or excess secretions, fever, chills, sweats, unintended weight loss, pleurtic or exertional chest pain, orthopnea PND, or leg swelling Pt denies any increase in rescue therapy over baseline, denies waking up needing it or having any early am or nocturnal exacerbations of coughing/wheezing/or dyspnea.  February 22, 2009 3:02 PM No ED visits No cough Pt notessome wheeze There is no nocturnal issues. no saba use PFR has been350 lately  There are not any alleviating or precipitating factors noted.  The symptoms do not generally fluctuate. Pt denies any significant sore throat, nasal congestion or excess secretions, fever, chills, sweats, unintended weight loss, pleurtic or exertional chest pain, orthopnea PND, or leg swelling   Asthma History    Asthma Control Assessment:    Age range: 12+ years    Symptoms: 0-2 days/week    Nighttime Awakenings: 0-2/month    Interferes w/ normal activity: some limitations    SABA use (not for EIB): 0-2 days/week    ATAQ questionnaire: 0    FEV1: 2.48 liters (today)    FEV1 Pred: 2.84 liters (today)    Asthma Control Assessment: Not Well  Controlled   Preventive Screening-Counseling & Management  Alcohol-Tobacco     Alcohol drinks/day: 0     Smoking Status: never  Current Medications (verified): 1)  Singulair 10 Mg Tabs (Montelukast Sodium) .Marland Kitchen.. 1 By Mouth At Bedtime 2)  Clarinex 5 Mg Tabs (Desloratadine) .Marland Kitchen.. 1 By Mouth Daily 3)  Symbicort 160-4.5 Mcg/act Aero (Budesonide-Formoterol Fumarate) .... 2 Puffs Two Times A Day 4)  Flonase 50 Mcg/act Susp (Fluticasone Propionate) .... 2 Sprays in Each Nostril Daily 5)  Zoloft 20 Mg/ml Conc (Sertraline Hcl) .Marland Kitchen.. 1 By Mouth Daily 6)  Xopenex Hfa 45 Mcg/act Aero (Levalbuterol Tartrate) .... As Needed 7)  Xopenex 1.25 Mg/66ml Nebu (Levalbuterol Hcl) .Marland Kitchen.. 1 Vial Up To Three Times A Day in Nebulizer As Needed 8)  Hydrochlorothiazide 25 Mg Tabs (Hydrochlorothiazide) .Marland Kitchen.. 1 By Mouth Daily 9)  Kaon-Cl-10 10 Meq Cr-Tabs (Potassium Chloride) .Marland Kitchen.. 1 By Mouth Daily 10)  Qvar 80 Mcg/act  Aers (Beclomethasone Dipropionate) .... Two  Puffs Twice Daily 11)  Omeprazole 20 Mg  Cpdr (Omeprazole) .... By Mouth Daily. Take One Half Hour Before Eating. 12)  Epipen 0.3 Mg/0.85ml (1:1000) Devi (Epinephrine Hcl (Anaphylaxis)) .... Use As Directed  Allergies (verified): 1)  ! * Latex  Past History:  Past medical, surgical, family and social histories (including risk factors) reviewed, and no changes noted (except as noted below).  Past Medical History: Reviewed history from 10/17/2008 and no changes required. Current Problems:  ASTHMA (  ICD-493.90) ALLERGIC RHINITIS (ICD-477.9) Hypertension  Past Surgical History: Reviewed history from 10/17/2008 and no changes required. Csection oral surgery  Family History: Reviewed history from 10/17/2008 and no changes required. Family History Asthma mother Family History Coronary Heart Disease  MGM  Clotting disorder MGM  Social History: Reviewed history from 10/17/2008 and no changes required. 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  The patient denies productive cough, non-productive cough, coughing up blood, chest pain, irregular heartbeats, acid heartburn, indigestion, loss of appetite, weight change, abdominal pain, difficulty swallowing, sore throat, tooth/dental problems, headaches, itching, ear ache, anxiety, depression, joint stiffness or pain, rash, change in color of mucus, and fever.    Vital Signs:  Patient profile:   40 year old female Weight:      280.25 pounds O2 Sat:      98 % on Room air Temp:     98.4 degrees F oral Pulse rate:   94 / minute BP sitting:   136 / 84  (left arm) Cuff size:   large  Vitals Entered By: Clarise Cruz Duncan Dull) (February 22, 2009 2:43 PM)  O2 Flow:  Room air  Physical Exam  Additional Exam:  Gen: Pleasant obese , in no distress,  normal affect ENT: nasal polyps,   mouth clear,  oropharynx clear, no postnasal drip, mild erythema Neck: No JVD, no TMG, no carotid bruits Lungs: No use of accessory muscles, no dullness to percussion, prolonged exp phase Cardiovascular: RRR, heart sounds normal, no murmur or gallops, no peripheral edema Abdomen: soft and NT, no HSM,  BS normal Musculoskeletal: No deformities, no cyanosis or clubbing Neuro: alert, non focal Skin: Warm, no lesions or rashes    Pre-Spirometry FEV1    Value: 2.48 L     Pred: 2.84 L     Impression & Recommendations:  Problem # 1:  EXTRINSIC ASTHMA, UNSPECIFIED (ICD-493.00) Assessment Unchanged Moderate persistent asthma with atopic features plan start Xolair No change in inhaled medications.   Maintain treatment program as currently prescribed.  Medications Added to Medication List This Visit: 1)  Xolair 150 Mg Solr (Omalizumab) .... 375 mg sq every 2 weeks  Complete Medication  List: 1)  Singulair 10 Mg Tabs (Montelukast sodium) .Marland Kitchen.. 1 by mouth at bedtime 2)  Clarinex 5 Mg Tabs (Desloratadine) .Marland Kitchen.. 1 by mouth daily 3)  Symbicort 160-4.5 Mcg/act Aero (Budesonide-formoterol fumarate) .... 2 puffs two times a day 4)  Flonase 50 Mcg/act Susp (Fluticasone propionate) .... 2 sprays in each nostril daily 5)  Zoloft 20 Mg/ml Conc (Sertraline hcl) .Marland Kitchen.. 1 by mouth daily 6)  Xopenex Hfa 45 Mcg/act Aero (Levalbuterol tartrate) .... As needed 7)  Xopenex 1.25 Mg/59ml Nebu (Levalbuterol hcl) .Marland Kitchen.. 1 vial up to three times a day in nebulizer as needed 8)  Hydrochlorothiazide 25 Mg Tabs (Hydrochlorothiazide) .Marland Kitchen.. 1 by mouth daily 9)  Kaon-cl-10 10 Meq Cr-tabs (Potassium chloride) .Marland Kitchen.. 1 by mouth daily 10)  Qvar 80 Mcg/act Aers (Beclomethasone dipropionate) .... Two  puffs twice daily 11)  Omeprazole 20 Mg Cpdr (Omeprazole) .... By mouth daily. take one half hour before eating. 12)  Epipen 0.3 Mg/0.80ml (1:1000) Devi (Epinephrine hcl (anaphylaxis)) .... Use as directed 13)  Xolair 150 Mg Solr (Omalizumab) .... 375 mg sq every 2  weeks  Other Orders: Est. Patient Level IV (16109) Admin of patients own med IM/SQ [phone])  Patient Instructions: 1)  No change in medications  2)  Return one month   Medication Administration  Injection # 1:    Medication: Xolair (omalizumab) 150mg     Diagnosis: EXTRINSIC ASTHMA, UNSPECIFIED (ICD-493.00)    Route: SQ    Site: left arm    Exp Date: 04/05/2012    Lot #: [id number]    Mfr: Salome Spotted    Comments: Injection given by Glade Lloyd in the exam room after doctors visit. Xolair 375mg , 1.49ml in left arm x2, 1.41ml in right arm Patient waitied 2 hours.    Patient tolerated injection without complications  Orders Added: 1)  Est. Patient Level IV [[id number]] 2)  Admin of patients own med IM/SQ [[id number]]   Appended Document: Pulmonary OV fax Sidney Ace, Shaune Pollack

## 2010-08-06 NOTE — Assessment & Plan Note (Signed)
Summary: Pulmonary OV   Copy to:  Dr. [place] Callas Primary Karson Reede/Referring Gayathri Futrell:  Dr. Shaune Pollack  CC:  Asthma follow-up.   Pt had PFT's in 10/2008.   Needs to discuss Xolair.Marland Kitchen  History of Present Illness: Pulmonary OV         This is a 40 year old female with severe persistent asthma with allergic, obesity, GERD, sinus dz factors.  Lifelong asthma.  PPT factors: dusts, fumes, cig smoke, allergens, acid reflux, anxiety, sinus dz.  ImmunoRx per [place] [facility] for 3years without help and stopped 2 years ago. Never had IgE checked.  Lifelong never smoker.  Best ever PFR 450  is bad when it gets to 300-350. Last in hosp 1/10.  No ED visits since.  December 15, 2008 4:03 PM Since last ov has done well. No ED visits.  No cough or dyspnea.  No wheeze. Sl pndrip.  Using xopenex hfa once weekly. Pt denies any significant sore throat, nasal congestion or excess secretions, fever, chills, sweats, unintended weight loss, pleurtic or exertional chest pain, orthopnea PND, or leg swelling Pt denies any increase in rescue therapy over baseline, denies waking up needing it or having any early am or nocturnal exacerbations of coughing/wheezing/or dyspnea.    Asthma History    Initial Asthma Severity Rating:    Age range: 12+ years    Symptoms: 0-2 days/week    Nighttime Awakenings: 0-2/month    Interferes w/ normal activity: minor limitations    SABA use (not for EIB): 0-2 days/week    Asthma Severity Assessment: Mild Persistent   Preventive Screening-Counseling & Management  Alcohol-Tobacco     Smoking Status: never  Current Medications (verified): 1)  Singulair 10 Mg Tabs (Montelukast Sodium) .Marland Kitchen.. 1 By Mouth At Bedtime 2)  Clarinex 5 Mg Tabs (Desloratadine) .Marland Kitchen.. 1 By Mouth Daily 3)  Symbicort 160-4.5 Mcg/act Aero (Budesonide-Formoterol Fumarate) .... 2 Puffs Two Times A Day 4)  Flonase 50 Mcg/act Susp (Fluticasone Propionate) .... 2 Sprays in Each Nostril Daily 5)  Zoloft 20 Mg/ml Conc (Sertraline  Hcl) .Marland Kitchen.. 1 By Mouth Daily 6)  Xopenex Hfa 45 Mcg/act Aero (Levalbuterol Tartrate) .... As Needed 7)  Xopenex 1.25 Mg/19ml Nebu (Levalbuterol Hcl) .Marland Kitchen.. 1 Vial Up To Three Times A Day in Nebulizer As Needed 8)  Hydrochlorothiazide 25 Mg Tabs (Hydrochlorothiazide) .Marland Kitchen.. 1 By Mouth Daily 9)  Kaon-Cl-10 10 Meq Cr-Tabs (Potassium Chloride) .Marland Kitchen.. 1 By Mouth Daily 10)  Qvar 80 Mcg/act  Aers (Beclomethasone Dipropionate) .... Two  Puffs Twice Daily 11)  Omeprazole 20 Mg  Cpdr (Omeprazole) .... By Mouth Daily. Take One Half Hour Before Eating. 12)  Epipen 0.3 Mg/0.66ml (1:1000) Devi (Epinephrine Hcl (Anaphylaxis)) .... Use As Directed  Allergies (verified): 1)  ! * Latex  Past History:  Past Medical History: Reviewed history from 10/17/2008 and no changes required. Current Problems:  ASTHMA (ICD-493.90) ALLERGIC RHINITIS (ICD-477.9) Hypertension  Social History: Smoking Status:  never  Review of Systems       The patient complains of shortness of breath with activity, non-productive cough, and acid heartburn.  The patient denies shortness of breath at rest, productive cough, coughing up blood, chest pain, irregular heartbeats, indigestion, loss of appetite, weight change, abdominal pain, difficulty swallowing, sore throat, tooth/dental problems, headaches, nasal congestion/difficulty breathing through nose, sneezing, itching, ear ache, anxiety, depression, hand/feet swelling, joint stiffness or pain, rash, change in color of mucus, and fever.    Vital Signs:  Patient profile:   40 year old female Height:  63 inches (160.02 cm) Weight:      280 pounds (127.27 kg) BMI:     49.78 O2 Sat:      96 % on Room air Temp:     99.0 degrees F (37.22 degrees C) oral Pulse rate:   110 / minute BP sitting:   132 / 62  (left arm) Cuff size:   large  Vitals Entered By: Michel Bickers CMA (December 15, 2008 3:56 PM)  O2 Flow:  Room air  Physical Exam  Additional Exam:  Gen: Pleasant obese , in no  distress,  normal affect ENT: nasal polyps,   mouth clear,  oropharynx clear, no postnasal drip, mild erythema Neck: No JVD, no TMG, no carotid bruits Lungs: No use of accessory muscles, no dullness to percussion, prolonged exp phase Cardiovascular: RRR, heart sounds normal, no murmur or gallops, no peripheral edema Abdomen: soft and NT, no HSM,  BS normal Musculoskeletal: No deformities, no cyanosis or clubbing Neuro: alert, non focal Skin: Warm, no lesions or rashes    Impression & Recommendations:  Problem # 1:  ASTHMA (ICD-493.90) Assessment Improved Severe persistent asthma with atopic features and severe GERD as ppt factors.  Sinus CT performed was neg for sinusitis.  IgE level very high on testing, pt agrees to initiate xolair rx twice montly  plan: no change in inhaled meds start xolair rx rov two months  Complete Medication List: 1)  Singulair 10 Mg Tabs (Montelukast sodium) .Marland Kitchen.. 1 by mouth at bedtime 2)  Clarinex 5 Mg Tabs (Desloratadine) .Marland Kitchen.. 1 by mouth daily 3)  Symbicort 160-4.5 Mcg/act Aero (Budesonide-formoterol fumarate) .... 2 puffs two times a day 4)  Flonase 50 Mcg/act Susp (Fluticasone propionate) .... 2 sprays in each nostril daily 5)  Zoloft 20 Mg/ml Conc (Sertraline hcl) .Marland Kitchen.. 1 by mouth daily 6)  Xopenex Hfa 45 Mcg/act Aero (Levalbuterol tartrate) .... As needed 7)  Xopenex 1.25 Mg/45ml Nebu (Levalbuterol hcl) .Marland Kitchen.. 1 vial up to three times a day in nebulizer as needed 8)  Hydrochlorothiazide 25 Mg Tabs (Hydrochlorothiazide) .Marland Kitchen.. 1 by mouth daily 9)  Kaon-cl-10 10 Meq Cr-tabs (Potassium chloride) .Marland Kitchen.. 1 by mouth daily 10)  Qvar 80 Mcg/act Aers (Beclomethasone dipropionate) .... Two  puffs twice daily 11)  Omeprazole 20 Mg Cpdr (Omeprazole) .... By mouth daily. take one half hour before eating. 12)  Epipen 0.3 Mg/0.65ml (1:1000) Devi (Epinephrine hcl (anaphylaxis)) .... Use as directed  Other Orders: Est. Patient Level III (16109) Misc. Referral (Misc.  Ref)  Patient Instructions: 1)  No change in medications 2)  Start Xolair 3)  Return two months  Appended Document: Pulmonary OV fax Ranjan Alesia Morin

## 2010-08-06 NOTE — Medication Information (Signed)
Summary: [facility]  [facility]   Imported By: Lanelle Bal 01/18/2009 14:51:04  _____________________________________________________________________  External Attachment:    Type:   Image     Comment:   External Document

## 2010-08-06 NOTE — Assessment & Plan Note (Signed)
Summary: rov 6 days ///kp   Copy to:  Dr. [place] Callas Primary Skanda Worlds/Referring Rollande Thursby:  Dr. Shaune Pollack  CC:  6 day follow up - states has been following recs from last OV and has improved some but is now having pain/pressure in eyes and nose with dizziness.  History of Present Illness: 40 year old female with severe persistent asthma with allergic, obesity, GERD, sinus dz factors Lifelong asthma.  PPT factors: dusts, fumes, cig smoke, allergens, acid reflux, anxiety, sinus dz.  ImmunoRx per [place] Callas for 3years without help and stopped 2 years ago. Never had IgE checked.  Lifelong never smoker.  Best ever PFR 450  is bad when it gets to 300-350. Last in hosp 1/10.  No ED visits since.  December 15, 2008-Since last ov has done well. No ED visits.  No cough or dyspnea.  No wheeze. Sl pndrip.  Using xopenex hfa once weekly.   February 22, 2009   No ED visits No cough Pt notessome wheeze There is no nocturnal issues. no saba use PFR has been350 lately   April 18, 2009--Presents for an acute office visit. Was seen at [facility] on 04-17-09 - increased wheezing and sob for 5 days- Given prednisone taper, and nebs.  --tx w/ aggressive pulm hygiene, cough /gerd/rhinitis prevention and steroids.   April 24, 2009--Returns for follow up.  She is cough free, sob is much better, no wheezing. States has been following recs from last OV and has improved some but is now having pain/pressure in eyes and nose with dizziness. Feeling better but still weak, not able to go back to work. Cough and wheeizng are totally resolved but no energy, wears out easily. Denies chest pain, orthopnea, hemoptysis, fever, n/v/d, edema, headache. cough free, sob is much better, no wheezing.    Medications Prior to Update: 1)  Singulair 10 Mg Tabs (Montelukast Sodium) .Marland Kitchen.. 1 By Mouth At Bedtime 2)  Clarinex 5 Mg Tabs (Desloratadine) .Marland Kitchen.. 1 By Mouth Daily 3)  Symbicort 160-4.5 Mcg/act Aero (Budesonide-Formoterol Fumarate) .... 2  Puffs Two Times A Day 4)  Flonase 50 Mcg/act Susp (Fluticasone Propionate) .... 2 Sprays in Each Nostril Daily 5)  Zoloft 20 Mg/ml Conc (Sertraline Hcl) .Marland Kitchen.. 1 By Mouth Daily 6)  Xopenex Hfa 45 Mcg/act Aero (Levalbuterol Tartrate) .... As Needed 7)  Xopenex 1.25 Mg/85ml Nebu (Levalbuterol Hcl) .Marland Kitchen.. 1 Vial Up To Three Times A Day in Nebulizer As Needed 8)  Hydrochlorothiazide 25 Mg Tabs (Hydrochlorothiazide) .Marland Kitchen.. 1 By Mouth Daily 9)  Kaon-Cl-10 10 Meq Cr-Tabs (Potassium Chloride) .Marland Kitchen.. 1 By Mouth Daily 10)  Qvar 80 Mcg/act  Aers (Beclomethasone Dipropionate) .... Two  Puffs Twice Daily 11)  Omeprazole 20 Mg  Cpdr (Omeprazole) .... By Mouth Daily. Take One Half Hour Before Eating. 12)  Epipen 0.3 Mg/0.27ml (1:1000) Devi (Epinephrine Hcl (Anaphylaxis)) .... Use As Directed 13)  Xolair 150 Mg  Solr (Omalizumab) .... 375 Mg Sq Every 2 Weeks 14)  Prednisone 50 Mg Tabs (Prednisone) .... Take One By Mouth Once Daily For 5 Days 15)  Tramadol Hcl 50 Mg Tabs (Tramadol Hcl) .Marland Kitchen.. 1 Every 4 Hr As Needed.  Current Medications (verified): 1)  Singulair 10 Mg Tabs (Montelukast Sodium) .Marland Kitchen.. 1 By Mouth At Bedtime 2)  Clarinex 5 Mg Tabs (Desloratadine) .Marland Kitchen.. 1 By Mouth Daily 3)  Symbicort 160-4.5 Mcg/act Aero (Budesonide-Formoterol Fumarate) .... 2 Puffs Two Times A Day 4)  Flonase 50 Mcg/act Susp (Fluticasone Propionate) .... 2 Sprays in Each Nostril Daily 5)  Zoloft 20 Mg/ml Conc (Sertraline Hcl) .Marland Kitchen.. 1 By Mouth Daily 6)  Xopenex Hfa 45 Mcg/act Aero (Levalbuterol Tartrate) .... As Needed 7)  Xopenex 1.25 Mg/73ml Nebu (Levalbuterol Hcl) .Marland Kitchen.. 1 Vial Up To Three Times A Day in Nebulizer As Needed 8)  Hydrochlorothiazide 25 Mg Tabs (Hydrochlorothiazide) .Marland Kitchen.. 1 By Mouth Daily 9)  Kaon-Cl-10 10 Meq Cr-Tabs (Potassium Chloride) .Marland Kitchen.. 1 By Mouth Daily 10)  Qvar 80 Mcg/act  Aers (Beclomethasone Dipropionate) .... Two  Puffs Twice Daily 11)  Omeprazole 20 Mg  Cpdr (Omeprazole) .... By Mouth Daily. Take One Half Hour Before  Eating. 12)  Epipen 0.3 Mg/0.72ml (1:1000) Devi (Epinephrine Hcl (Anaphylaxis)) .... Use As Directed 13)  Xolair 150 Mg  Solr (Omalizumab) .... 375 Mg Sq Every 2 Weeks 14)  Tramadol Hcl 50 Mg Tabs (Tramadol Hcl) .Marland Kitchen.. 1 Every 4 Hr As Needed. 15)  Zithromax Z-Pak 250 Mg Tabs (Azithromycin) .... Take As Directed.  Allergies (verified): 1)  ! * Latex  Past History:  Past Medical History: Last updated: 10/17/2008 Current Problems:  ASTHMA (ICD-493.90) ALLERGIC RHINITIS (ICD-477.9) Hypertension  Past Surgical History: Last updated: 10/17/2008 Csection oral surgery  Family History: Last updated: 10/17/2008 Family History Asthma mother Family History Coronary Heart Disease  MGM  Clotting disorder MGM  Social History: Last updated: 10/17/2008 [place] [employer] in [employer] Married one daughter  no pets never smoker  Risk Factors: Alcohol Use: 0 (02/22/2009)  Risk Factors: Smoking Status: never (02/22/2009)  Vital Signs:  Patient profile:   40 year old female Height:      63 inches Weight:      275.38 pounds BMI:     48.96 O2 Sat:      96 % on Room air Temp:     99.0 degrees F oral Pulse rate:   110 / minute BP sitting:   142 / 86  (left arm) Cuff size:   large  Vitals Entered By: Boone Master CNA (April 24, 2009 11:25 AM)  O2 Flow:  Room air CC: 6 day follow up - states has been following recs from last OV and has improved some but is now having pain/pressure in eyes and nose with dizziness Is Patient Diabetic? No Comments Medications reviewed with patient Daytime contact number verified with patient. Boone Master CNA  April 24, 2009 11:50 AM    Physical Exam  Additional Exam:  Gen: Pleasant obese , in no distress,  normal affect ENT:  mouth clear,  oropharynx clear, no postnasal drip,  Neck: No JVD, no TMG, no carotid bruits Lungs: Coarse BS w/ no wheezing, resolved upper airway psuedowheezing.  Cardiovascular: RRR, heart sounds normal, no murmur or  gallops, no peripheral edema Abdomen: soft and NT, no HSM,  BS normal, obese  Musculoskeletal: No deformities, no cyanosis or clubbing Neuro: alert, non focal Skin: Warm, no lesions or rashes    Impression & Recommendations:  Problem # 1:  ASTHMA (ICD-493.90) Recent flare with upper airway inflammation w/ psuedowheeizng.  Now resolving improved w/ treatment directed at GERD/Rhinitis prevention REC:  Mucinex DM two times a day as needed cough./congestion  Tramadol 50mg  every 4 hr for breakthrough coughing.  Restart Clarinex once daily , hold till until zpack is finished.   Continue on Omeprazole 20mg  two times a day before meal  Continue on pepcid 20mg  at bedtime  Try not to cough or clear throat , use ice chips, water, sugarless candy.  NO MINTS.  Saline nasal rinses as needed  Zpack take  directed .  Afrin 2 puffs two times a day for 5 days.  follow up Dr. Delford Field in 3 weeks and as needed   Medications Added to Medication List This Visit: 1)  Zithromax Z-pak 250 Mg Tabs (Azithromycin) .... Take as directed.  Complete Medication List: 1)  Singulair 10 Mg Tabs (Montelukast sodium) .Marland Kitchen.. 1 by mouth at bedtime 2)  Clarinex 5 Mg Tabs (Desloratadine) .Marland Kitchen.. 1 by mouth daily 3)  Symbicort 160-4.5 Mcg/act Aero (Budesonide-formoterol fumarate) .... 2 puffs two times a day 4)  Flonase 50 Mcg/act Susp (Fluticasone propionate) .... 2 sprays in each nostril daily 5)  Zoloft 20 Mg/ml Conc (Sertraline hcl) .Marland Kitchen.. 1 by mouth daily 6)  Xopenex Hfa 45 Mcg/act Aero (Levalbuterol tartrate) .... As needed 7)  Xopenex 1.25 Mg/21ml Nebu (Levalbuterol hcl) .Marland Kitchen.. 1 vial up to three times a day in nebulizer as needed 8)  Hydrochlorothiazide 25 Mg Tabs (Hydrochlorothiazide) .Marland Kitchen.. 1 by mouth daily 9)  Kaon-cl-10 10 Meq Cr-tabs (Potassium chloride) .Marland Kitchen.. 1 by mouth daily 10)  Qvar 80 Mcg/act Aers (Beclomethasone dipropionate) .... Two  puffs twice daily 11)  Omeprazole 20 Mg Cpdr (Omeprazole) .... By mouth daily.  take one half hour before eating. 12)  Epipen 0.3 Mg/0.21ml (1:1000) Devi (Epinephrine hcl (anaphylaxis)) .... Use as directed 13)  Xolair 150 Mg Solr (Omalizumab) .... 375 mg sq every 2 weeks 14)  Tramadol Hcl 50 Mg Tabs (Tramadol hcl) .Marland Kitchen.. 1 every 4 hr as needed. 15)  Zithromax Z-pak 250 Mg Tabs (Azithromycin) .... Take as directed.  Other Orders: Est. Patient Level III (30865)  Patient Instructions: 1)  Mucinex DM two times a day as needed cough./congestion  2)  Tramadol 50mg  every 4 hr for breakthrough coughing.  3)  Restart Clarinex once daily , hold till until zpack is finished.   4)  Continue on Omeprazole 20mg  two times a day before meal  5)  Continue on pepcid 20mg  at bedtime  6)  Try not to cough or clear throat , use ice chips, water, sugarless candy.  7)  NO MINTS.  8)  Saline nasal rinses as needed  9)  Zpack take directed .  10)  Afrin 2 puffs two times a day for 5 days.  11)  follow up Dr. Delford Field in 3 weeks and as needed  Prescriptions: ZITHROMAX Z-PAK 250 MG TABS (AZITHROMYCIN) take as directed.  #1 x 0   Entered and Authorized by:   Rubye Oaks NP   Signed by:   Tammy Parrett NP on 04/24/2009   Method used:   Electronically to        [facility] (retail[facility]       [address]       [place], [place]  [address]       Ph: [phone] or [phone]       Fax: [phone]   RxID:   [id number]

## 2010-08-06 NOTE — Letter (Signed)
Summary: [facility]  [facility]   Imported By: Lester [place] 12/21/2008 11:19:37  _____________________________________________________________________  External Attachment:    Type:   Image     Comment:   External Document

## 2010-08-06 NOTE — Assessment & Plan Note (Signed)
Summary: xolair//mbw   Allergies: 1)  ! * Latex   Other Orders: Admin of patients own med IM/SQ (14431V)   Medication Administration  Injection # 1:    Medication: Xolair (omalizumab) 150mg     Diagnosis: EXTRINSIC ASTHMA, UNSPECIFIED (ICD-493.00)    Route: SQ    Site: L deltoid    Exp Date: 03/08/2012    Lot #: [id number]    Mfr: GENENTECH    Comments: 1.0 ML X 2 IN LEFT ARM AND 1.0 ML IN RIGHT ARM PT WAITED 30 MINS    Patient tolerated injection without complications    Given by: TAMMY SCOTT IN ALLERGY LAB  Orders Added: 1)  Admin of patients own med IM/SQ [[id number]]

## 2010-08-06 NOTE — Assessment & Plan Note (Signed)
Summary: Pulmonary Consultation   Copy to:  Dr. [place] Callas Primary Provider/Referring Provider:  Dr. Shaune Pollack  CC:  Pulmonary consult for uncontrolled asthma..  History of Present Illness: Pulmonary Consultation         This is a 40 year old female who presents with asthma.  The patient complains of history of diagnosed Asthma, cough, shortness of breath, chest tightness, chest pain, wheezing, nocturnal awakening, and exercise induced symptoms, but denies mucous production and congestion.  Symptoms appear triggered by stress, exercise, URI, sinusitis, allergen all pollen, dust mites, cat dander:, and irritant:dust, perfumes.  The patient also has the following associated problems: heartburn, sour taste in mouth, allergic rhinitis, chronic sinus disease, indigestion, nasal congestion, difficulty breathing through nose, anxiety, non-productive cough, chest pain, and chest tightness.  Previous effective treatment includes short acting beta-agonist:, inhaled corticosteroids, ICS + LABA, and oral steroids.  Asthma monitoring and treatment delivery to date has included: use of home nebulizer and use of PF meter.  Asthma severity risk factors include: hospitalized.    Lifelong asthma.  PPT factors: dusts, fumes, cig smoke, allergens, acid reflux, anxiety, sinus dz.  ImmunoRx per [place] Callas for 3years without help and stopped 2 years ago. Never had IgE checked.  Lifelong never smoker.  Best ever PFR 450  is bad when it gets to 300-350. Last in hosp 1/10.  No ED visits since.  Current Medications (verified): 1)  Singulair 10 Mg Tabs (Montelukast Sodium) .Marland Kitchen.. 1 By Mouth At Bedtime 2)  Clarinex 5 Mg Tabs (Desloratadine) .Marland Kitchen.. 1 By Mouth Daily 3)  Symbicort 160-4.5 Mcg/act Aero (Budesonide-Formoterol Fumarate) .... 2 Puffs Two Times A Day 4)  Flonase 50 Mcg/act Susp (Fluticasone Propionate) .Marland Kitchen.. 1-2 Sprays in Each Nostril Daily 5)  Zoloft 20 Mg/ml Conc (Sertraline Hcl) .Marland Kitchen.. 1 By Mouth Daily 6)  Xopenex Hfa 45  Mcg/act Aero (Levalbuterol Tartrate) .... As Needed 7)  Xopenex 1.25 Mg/55ml Nebu (Levalbuterol Hcl) .Marland Kitchen.. 1 Vial Up To Three Times A Day in Nebulizer 8)  Hydrochlorothiazide 25 Mg Tabs (Hydrochlorothiazide) .Marland Kitchen.. 1 By Mouth Daily 9)  Camila 0.35 Mg Tabs (Norethindrone (Contraceptive)) .Marland Kitchen.. 1 By Mouth Daily 10)  Spiriva Handihaler 18 Mcg Caps (Tiotropium Bromide Monohydrate) .... Inhale 1 Capsule Daily 11)  Kaon-Cl-10 10 Meq Cr-Tabs (Potassium Chloride) .Marland Kitchen.. 1 By Mouth Daily 12)  Pulmicort 0.5 Mg/12ml Susp (Budesonide) .Marland Kitchen.. 1 Vial in Nebulizer Two Times A Day  Allergies (verified): 1)  ! 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  The patient denies productive cough, coughing up blood, irregular heartbeats, acid heartburn, indigestion,  loss of appetite, weight change, abdominal pain, difficulty swallowing, sore throat, tooth/dental problems, sneezing, itching, ear ache, depression, hand/feet swelling, joint stiffness or pain, rash, change in color of mucus, and fever.        See HPI for Pulmonary, Cardiac, General, and ENT review of systems.  Vital Signs:  Patient profile:   40 year old female Height:      64 inches (162.56 cm) Weight:      283.25 pounds (128.75 kg) BMI:     48.80 O2  Sat:      96 % Temp:     98.0 degrees F (36.67 degrees C) oral Pulse rate:   108 / minute BP sitting:   140 / 72  (left arm) Cuff size:   large  Vitals Entered By: Michel Bickers CMA (October 17, 2008 1:28 PM)  O2 Sat at Rest %:  96 O2 Flow:  room air  Physical Exam  Additional Exam:  Gen: Pleasant obese , in no distress,  normal affect ENT: nasal polyps,   mouth clear,  oropharynx clear, no postnasal drip, mild erythema Neck: No JVD, no TMG, no carotid bruits Lungs: No use of accessory muscles, no dullness to percussion, prolonged exp phase Cardiovascular: RRR, heart sounds normal, no murmur or gallops, no peripheral edema Abdomen: soft and NT, no HSM,  BS normal Musculoskeletal: No deformities, no cyanosis or clubbing Neuro: alert, non focal Skin: Warm, no lesions or rashes  Immunoglobulin E     [H]  765.7 IU/mL                 0.0-180.0  CT of Sinus  Procedure date:  10/19/2008  Findings:      The CT of the paranasal sinuses was normal with no signs of acute or chronic infection.    IMPRESSION: Normal limited CT of the paranasal sinuses.  Impression & Recommendations:  Problem # 1:  ASTHMA (ICD-493.90) Assessment Deteriorated Severe persistent asthma with atopic features and severe GERD as ppt factors.  Sinus CT performed was neg for sinusitis.  IgE level very high on testing today  plan: Labs today: IgE level >700  will consider pt for Xolair Rx Full pulmonary function will be obtained Reflux diet and omeprazole daily Stop pulmicort in nebulizer Start Qvar two puff twice daily Stay on Symbicort Stop Spiriva CT Sinus  noted to be negative Return one months  Medications Added to Medication List This Visit: 1)  Flonase 50 Mcg/act Susp (Fluticasone propionate) .... 2 sprays in each nostril daily 2)  Xopenex 1.25 Mg/61ml Nebu (Levalbuterol hcl) .Marland Kitchen.. 1 vial up to three times a day in nebulizer as needed 3)  Hydrochlorothiazide 25 Mg Tabs (Hydrochlorothiazide) .Marland Kitchen.. 1 by  mouth daily 4)  Camila 0.35 Mg Tabs (Norethindrone (contraceptive)) .Marland Kitchen.. 1 by mouth daily 5)  Spiriva Handihaler 18 Mcg Caps (Tiotropium bromide monohydrate) .... Inhale 1 capsule daily 6)  Kaon-cl-10 10 Meq Cr-tabs (Potassium chloride) .Marland Kitchen.. 1 by mouth daily 7)  Pulmicort 0.5 Mg/102ml Susp (Budesonide) .Marland Kitchen.. 1 vial in nebulizer two times a day 8)  Qvar 80 Mcg/act Aers (Beclomethasone dipropionate) .... Two  puffs twice daily 9)  Omeprazole 20 Mg Cpdr (Omeprazole) .... By mouth daily. take one half hour before eating.  Complete Medication List: 1)  Singulair 10 Mg Tabs (Montelukast sodium) .Marland Kitchen.. 1 by mouth at bedtime 2)  Clarinex 5 Mg Tabs (Desloratadine) .Marland Kitchen.. 1 by mouth daily 3)  Symbicort 160-4.5 Mcg/act Aero (Budesonide-formoterol fumarate) .... 2 puffs two times a day  4)  Flonase 50 Mcg/act Susp (Fluticasone propionate) .... 2 sprays in each nostril daily 5)  Zoloft 20 Mg/ml Conc (Sertraline hcl) .Marland Kitchen.. 1 by mouth daily 6)  Xopenex Hfa 45 Mcg/act Aero (Levalbuterol tartrate) .... As needed 7)  Xopenex 1.25 Mg/76ml Nebu (Levalbuterol hcl) .Marland Kitchen.. 1 vial up to three times a day in nebulizer as needed 8)  Hydrochlorothiazide 25 Mg Tabs (Hydrochlorothiazide) .Marland Kitchen.. 1 by mouth daily 9)  Camila 0.35 Mg Tabs (Norethindrone (contraceptive)) .Marland Kitchen.. 1 by mouth daily 10)  Kaon-cl-10 10 Meq Cr-tabs (Potassium chloride) .Marland Kitchen.. 1 by mouth daily 11)  Qvar 80 Mcg/act Aers (Beclomethasone dipropionate) .... Two  puffs twice daily 12)  Omeprazole 20 Mg Cpdr (Omeprazole) .... By mouth daily. take one half hour before eating.  Other Orders: New Patient Level V (16109) T-IgE (Immunoglobulin E) (60454-09811) HFA Instruction [phone]) Pulmonary Referral (Pulmonary) Radiology Referral (Radiology)  Patient Instructions: 1)  Labs today: IgE level 2)  Full pulmonary function will be obtained 3)  Reflux diet and omeprazole daily 4)  Stop pulmicort in nebulizer 5)  Start Qvar two puff twice daily 6)  Stay on Symbicort 7)   Stop Spiriva 8)  CT Sinus  9)  Return one months Prescriptions: OMEPRAZOLE 20 MG  CPDR (OMEPRAZOLE) By mouth daily. Take one half hour before eating.  #30 x 6   Entered and Authorized by:   Storm Frisk MD   Signed by:   Storm Frisk MD on 10/17/2008   Method used:   Print then Give to Patient   RxID:   [id number] QVAR 80 MCG/ACT  AERS (BECLOMETHASONE DIPROPIONATE) Two  puffs twice daily  #1 x 6   Entered and Authorized by:   Storm Frisk MD   Signed by:   Storm Frisk MD on 10/17/2008   Method used:   Print then Give to Patient   RxID:   [id number]   Appended Document: Pulmonary Consultation fax [facility]

## 2010-08-06 NOTE — Assessment & Plan Note (Signed)
Summary: Pulmonary OV   Copy to:  Dr. [place] Callas Primary Amaia Lavallie/Referring [place] Mckimmy:  Dr. Shaune Pollack  CC:  2 wk follow up.  Pt states sinuses and breathing have improved since last OV.  Denies SOB, wheezing, chest tightness, and cough.  No complaints.  Would like flu vac today.Marland Kitchen  History of Present Illness: 40 year old female with severe persistent asthma with allergic, obesity, GERD, sinus dz factors Lifelong asthma.  PPT factors: dusts, fumes, cig smoke, allergens, acid reflux, anxiety, sinus dz.  ImmunoRx per [place] Callas for 3years without help and stopped in2008 Never had IgE checked.  Lifelong never smoker.  Best ever PFR 450  is bad when it gets to 300-350.   Nov 23, 2009 9:07 AM asthma f/u : The pt is doing fairly well,  allergy season is an issue,  overall ok.  No real dyspnea.  No cough. No chest pain.   Notes mild pn drip.  Only using SABA pre exercise. Pt denies any significant sore throat, nasal congestion or excess secretions, fever, chills, sweats, unintended weight loss, pleurtic or exertional chest pain, orthopnea PND, or leg swelling Pt denies any increase in rescue therapy over baseline, denies waking up needing it or having any early am or nocturnal exacerbations of coughing/wheezing/or dyspnea.   March 04, 2010 10:01 AM ? sinus issues.  noting illness x 1week. ? allergies at first.  symptoms now: pressure in sinus and headaches, dizzy, nausea.  At night , more coughing and more use of rescue inhaler use twice at night. Notes more dyspnea esp at night.  Feels draggy and fatigued. Wants to lay down. Had low grade fever last week.  Cough is productive clear and occ pieces of dark yellow.  Clear out of nose.  No heartburn or indigestion.   March 19, 2010 12:12 PM The pt is better and the sinuses are better.  The pt was   rx with  augmentin and finished and one more day pred. Now: no cough.  less dyspnea  No active wheeze.  No mucus production.  No chest pain.    Asthma  History    Asthma Control Assessment:    Age range: 12+ years    Symptoms: 0-2 days/week    Nighttime Awakenings: 0-2/month    Interferes w/ normal activity: no limitations    SABA use (not for EIB): 0-2 days/week    ATAQ questionnaire: 0    FEV1: 2.48 liters (today)    FEV1 Pred: 2.84 liters (today)    Exacerbations requiring oral systemic steroids: 0-1/year    Asthma Control Assessment: Well Controlled   Current Medications (verified): 1)  Singulair 10 Mg Tabs (Montelukast Sodium) .Marland Kitchen.. 1 By Mouth At Bedtime 2)  Zyrtec Allergy 10 Mg Tbdp (Cetirizine Hcl) .... Hold For 10days Then Resume 3)  Symbicort 160-4.5 Mcg/act Aero (Budesonide-Formoterol Fumarate) .... 2 Puffs Two Times A Day 4)  Nasonex 50 Mcg/act Susp (Mometasone Furoate) .... 2 Sprays Each Nostril Two Times A Day  X 10 Days Then Once Daily 5)  Xopenex Hfa 45 Mcg/act Aero (Levalbuterol Tartrate) .... As Needed 6)  Xopenex 1.25 Mg/65ml Nebu (Levalbuterol Hcl) .Marland Kitchen.. 1 Vial Up To Three Times A Day in Nebulizer As Needed 7)  Hydrochlorothiazide 25 Mg Tabs (Hydrochlorothiazide) .Marland Kitchen.. 1 By Mouth Daily 8)  Kaon-Cl-10 10 Meq Cr-Tabs (Potassium Chloride) .Marland Kitchen.. 1 By Mouth Daily 9)  Qvar 80 Mcg/act Aers (Beclomethasone Dipropionate) .... Inhale 2 Puffs Two Times A Day 10)  Prednisone 10 Mg  Tabs (Prednisone) .Marland KitchenMarland KitchenMarland Kitchen  Take As Directed 4 Each Am X 4 Days, 3 X 4 Days, 2 X 4 Days, 1 X 4 Days Then Stop  Allergies (verified): 1)  ! * Latex  Past History:  Past medical, surgical, family and social histories (including risk factors) reviewed, and no changes noted (except as noted below).  Past Medical History: Reviewed history from 10/17/2008 and no changes required. Current Problems:  ASTHMA (ICD-493.90) ALLERGIC RHINITIS (ICD-477.9) Hypertension  Past Surgical History: Reviewed history from 10/17/2008 and no changes required. Csection oral surgery  Family History: Reviewed history from 10/17/2008 and no changes required. Family History  Asthma mother Family History Coronary Heart Disease  MGM  Clotting disorder MGM  Social History: Reviewed history from 10/17/2008 and no changes required. 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    Vital Signs:  Patient profile:   40 year old female Height:      63.5 inches Weight:      263.50 pounds BMI:     46.11 O2 Sat:      99 % on Room air Temp:     98.6 degrees F oral Pulse rate:   86 / minute BP sitting:   134 / 86  (left arm) Cuff size:   large  Vitals Entered By: Gweneth Dimitri RN (March 19, 2010 12:08 PM)  O2 Flow:  Room air CC: 2 wk follow up.  Pt states sinuses and breathing have improved since last OV.  Denies SOB, wheezing, chest tightness, cough.  No complaints.  Would like flu vac today. Comments Pt has allergy test scheduled with Dr. [place] Callas May 01, 2010 Medications reviewed with patient Daytime contact number verified with patient. Gweneth Dimitri RN  March 19, 2010 12:07 PM    Physical Exam  Additional Exam:  Gen: Pleasant obese , in no distress,  normal affect ENT:  mouth clear,  oropharynx clear, bilateral nares with purulence and erythema Neck: No JVD, no TMG, no carotid bruits Lungs: Coarse BS w/ no wheezing, resolved upper airway pseudowheezing.  Cardiovascular: RRR, heart sounds normal, no murmur or gallops, no peripheral edema Abdomen: soft and NT, no HSM,  BS normal, obese    Musculoskeletal: No deformities, no cyanosis or clubbing Neuro: alert, non focal Skin: Warm, no lesions or rashes    Pre-Spirometry FEV1    Value: 2.48 L     Pred: 2.84 L     Impression & Recommendations:  Problem # 1:  OTHER ACUTE SINUSITIS (ICD-461.8) Assessment Improved improved sinusitis  plan  cont nasal steroid no further antibiotics Her updated medication list for this problem includes:    Nasonex 50 Mcg/act Susp (Mometasone furoate) .Marland Kitchen... 2 sprays each nostril two times a day  x 10 days then once daily  Orders: Est. Patient Level III (04540)  Problem # 2:  EXTRINSIC ASTHMA, UNSPECIFIED (ICD-493.00) Assessment: Unchanged stable asthma  plan  cont inhaled meds  flu vaccine   Medications Added to Medication List This Visit: 1)  Zyrtec Allergy 10 Mg Tbdp (Cetirizine hcl) .... One daily 2)  Nasonex  50 Mcg/act Susp (Mometasone furoate) .... 2 sprays each nostril two times a day  x 10 days then once daily  Complete Medication List: 1)  Singulair 10 Mg Tabs (Montelukast sodium) .Marland Kitchen.. 1 by mouth at bedtime 2)  Zyrtec Allergy 10 Mg Tbdp (Cetirizine hcl) .... One daily 3)  Symbicort 160-4.5 Mcg/act Aero (Budesonide-formoterol fumarate) .... 2 puffs two times a day 4)  Nasonex 50 Mcg/act Susp (Mometasone furoate) .... 2 sprays each nostril two times a day  x 10 days then once daily 5)  Xopenex Hfa 45 Mcg/act Aero (Levalbuterol tartrate) .... As needed 6)  Xopenex 1.25 Mg/47ml Nebu (Levalbuterol hcl) .Marland Kitchen.. 1 vial up to three times a day in nebulizer as needed 7)  Hydrochlorothiazide 25 Mg Tabs (Hydrochlorothiazide) .Marland Kitchen.. 1 by mouth daily 8)  Kaon-cl-10 10 Meq Cr-tabs (Potassium chloride) .Marland Kitchen.. 1 by mouth daily 9)  Qvar 80 Mcg/act Aers (Beclomethasone dipropionate) .... Inhale 2 puffs two times a day  Other Orders: Admin 1st Vaccine (16109) Flu Vaccine 102yrs + [phone])  Patient Instructions: 1)  Flu vaccine today 2)  You may resume zyrtec 3)  No further antibiotics, finish  prednisone 4)  Return 4 months     Prevention & Chronic Care Immunizations   Influenza vaccine: Fluvax 3+  (03/19/2010)    Tetanus booster: Not documented    Pneumococcal vaccine: Pneumovax  (05/16/2009)  Other Screening   Pap smear: Not documented   Smoking status: never  (03/04/2010)  Lipids   Total Cholesterol: Not documented   LDL: Not documented   LDL Direct: Not documented   HDL: Not documented   Triglycerides: Not documented  Hypertension   Last Blood Pressure: 134 / 86  (03/19/2010)   Serum creatinine: Not documented   Serum potassium Not documented  Self-Management Support :    Hypertension self-management support: Not documented   Nursing Instructions: Give Flu vaccine today    Flu Vaccine Consent Questions     Do you have a history of severe allergic reactions to this vaccine? no    Any prior history of allergic reactions to egg and/or gelatin? no    Do you have a sensitivity to the preservative Thimersol? no    Do you have a past history of Guillan-Barre Syndrome? no    Do you currently have an acute febrile illness? no    Have you ever had a severe reaction to latex? no    Vaccine information given and explained to patient? yes    Are you currently pregnant? no    Lot Number:[id number]   Exp Date:01/04/2011   Site Given  Left Deltoid IMbflu  Gweneth Dimitri RN  March 19, 2010 12:20 PM  Appended Document: Pulmonary OV ref  Kanija gates

## 2010-08-06 NOTE — Miscellaneous (Signed)
Summary: Orders Update pft charges  Clinical Lists Changes  Orders: Added new Service order of Carbon Monoxide diffusing w/capacity (94720) - Signed Added new Service order of Lung Volumes (94240) - Signed Added new Service order of Spirometry (Pre & Post) (94060) - Signed 

## 2010-08-06 NOTE — Medication Information (Signed)
Summary: Prior Authorization & Approved for Xolair/[facility]  Prior Authorization & Approved for Xolair/[facility]   Imported By: Sherian Rein 01/01/2009 07:44:28  _____________________________________________________________________  External Attachment:    Type:   Image     Comment:   External Document

## 2010-08-06 NOTE — Miscellaneous (Signed)
Summary: PFT  Clinical Lists Changes  Observations: Added new observation of PFT RSLT: normal (11/02/2008 8:20) Added new observation of DLCO/VA%EXP: 149 % (11/02/2008 8:20) Added new observation of DLCO/VA: 6.26 % (11/02/2008 8:20) Added new observation of DLCO % EXPEC: 77 % (11/02/2008 8:20) Added new observation of DLCO: 23.8 % (11/02/2008 8:20) Added new observation of RV % EXPECT: 90 % (11/02/2008 8:20) Added new observation of RV: 1.47 L (11/02/2008 8:20) Added new observation of TLC % EXPECT: 83 % (11/02/2008 8:20) Added new observation of TLC: 4.23 L (11/02/2008 8:20) Added new observation of FEF2575%EXPS: 107 % (11/02/2008 8:20) Added new observation of PSTFEF25/75P: 3.28  (11/02/2008 8:20) Added new observation of PSTFEF25/75%: 3.51 L/min (11/02/2008 8:20) Added new observation of FEV1FVCPRDPS: 78 % (11/02/2008 8:20) Added new observation of PSTFEV1/FVC: 90 % (11/02/2008 8:20) Added new observation of POSTFEV1%PRD: 92 % (11/02/2008 8:20) Added new observation of FEV1PRDPST: 2.64 L (11/02/2008 8:20) Added new observation of POST FEV1: 2.62 L/min (11/02/2008 8:20) Added new observation of POST FVC%EXP: 81 % (11/02/2008 8:20) Added new observation of FVCPRDPST: 3.62 L/min (11/02/2008 8:20) Added new observation of POST FVC: 2.93 L (11/02/2008 8:20) Added new observation of FEF % EXPEC: 110 % (11/02/2008 8:20) Added new observation of FEF25-75%PRE: 3.28 L/min (11/02/2008 8:20) Added new observation of FEF 25-75%: 3.60 L/min (11/02/2008 8:20) Added new observation of FEV1/FVC PRE: 78 % (11/02/2008 8:20) Added new observation of FEV1/FVC: 90 % (11/02/2008 8:20) Added new observation of FEV1 % EXP: 87 % (11/02/2008 8:20) Added new observation of FEV1 PREDICT: 2.84 L (11/02/2008 8:20) Added new observation of FEV1: 2.48 L (11/02/2008 8:20) Added new observation of FVC % EXPECT: 76 % (11/02/2008 8:20) Added new observation of FVC PREDICT: 3.62 L (11/02/2008 8:20) Added new  observation of FVC: 2.75 L (11/02/2008 8:20) Added new observation of PFT HEIGHT: 64  (11/02/2008 8:20) Added new observation of PFT DATE: 10/30/2008  (11/02/2008 8:20)  Pulmonary Function Test Date: 10/30/2008 Height (in.): 64 Gender: Female  Pre-Spirometry FVC    Value: 2.75 L/min   Pred: 3.62 L/min     % Pred: 76 % FEV1    Value: 2.48 L     Pred: 2.84 L     % Pred: 87 % FEV1/FVC  Value: 90 %     Pred: 78 %    FEF 25-75  Value: 3.60 L/min   Pred: 3.28 L/min     % Pred: 110 %  Post-Spirometry FVC    Value: 2.93 L/min   Pred: 3.62 L/min     % Pred: 81 % FEV1    Value: 2.62 L     Pred: 2.64 L     % Pred: 92 % FEV1/FVC  Value: 90 %     Pred: 78 %    FEF 25-75  Value: 3.51 L/min   Pred: 3.28 L/min     % Pred: 107 %  Lung Volumes TLC    Value: 4.23 L   % Pred: 83 % RV    Value: 1.47 L   % Pred: 90 % DLCO    Value: 23.8 %   % Pred: 77 % DLCO/VA  Value: 6.26 %   % Pred: 149 %  Evaluation: normal  Entered by:  Michel Bickers CMA  November 02, 2008 8:24 AM   Appended Document: PFT result noted  patient aware

## 2010-08-16 ENCOUNTER — Ambulatory Visit (INDEPENDENT_AMBULATORY_CARE_PROVIDER_SITE_OTHER): Payer: BC Managed Care – PPO | Admitting: Internal Medicine

## 2010-08-16 ENCOUNTER — Encounter: Payer: Self-pay | Admitting: Internal Medicine

## 2010-08-16 DIAGNOSIS — J45901 Unspecified asthma with (acute) exacerbation: Secondary | ICD-10-CM | POA: Insufficient documentation

## 2010-08-16 DIAGNOSIS — J45909 Unspecified asthma, uncomplicated: Secondary | ICD-10-CM

## 2010-08-19 ENCOUNTER — Telehealth: Payer: Self-pay | Admitting: Internal Medicine

## 2010-08-19 ENCOUNTER — Encounter: Payer: Self-pay | Admitting: Critical Care Medicine

## 2010-08-22 NOTE — Assessment & Plan Note (Signed)
Summary: SICK/CB   Copy to:  Dr. McIntosh Callas Primary Provider/Referring Provider:  Dr. Shaune Pollack  CC:  Acute visit-started feeling bad Tuesday(start menstrual cycle); vomiting, slight fever Wednesday, and cough-dark yellow in color; Increased Wheezing today; symtpoms worse at night..  History of Present Illness: Nov 23, 2009 Asthma, Allergic rhinitis asthma f/u : The pt is doing fairly well,  allergy season is an issue,  overall ok.  No real dyspnea.  No cough. No chest pain.   Notes mild pn drip.  Only using SABA pre exercise. Pt denies any significant sore throat, nasal congestion or excess secretions, fever, chills, sweats, unintended weight loss, pleurtic or exertional chest pain, orthopnea PND, or leg swelling Pt denies any increase in rescue therapy over baseline, denies waking up needing it or having any early am or nocturnal exacerbations of coughing/wheezing/or dyspnea.  May 10, 2010-  Asthma, Allergic Rhinitis.....................  Husband here Acute visit in lieu of Dr Delford Field today. Nurse-CC: Acute visit-nasal/chest congestion,cough-brown in color, SOB with activity and wheezing. Slight fever earlier in the week.  For last week has had more nasal and chest congestion Had been chilled on children's field trip. . Using Mucinex and Neti pot. More DOE climbing stairs or reading to her children. Wheeze and productive cough. Left ear pain last week now better. Epistaxis. No recent PFT check. Usually prednisone helps this.   Using rescue meds several times daily for last 2-3 days. Dark green mucus. No GI upset, no recent antibiotics.   August 16, 2010- Asthma, Allergic Rhinitis.............Marland Kitchenhusband here Nurse-CC: Acute visit-started feeling bad Tuesday(start menstrual cycle); vomiting,slight fever Wednesday,cough-dark yellow in color; Increased Wheezing today; symtpoms worse at night.// Acute visit- 3 days onset like a cold. Went home early from work. Used neb x several. Over last 2 days  head and chest congested. N&V yesterday. Not pregnant. Denies muscle aches. Had 2 days of fever, chills. White/ dark yellow.. Started mucinex.  Peak flow today 125/ good = 350. Uses Symbicort. Did have flu vax.      Asthma History    Asthma Control Assessment:    Age range: 12+ years    Symptoms: >2 days/week    Nighttime Awakenings: 0-2/month    Interferes w/ normal activity: some limitations    SABA use (not for EIB): >2 days/week    FEV1: 2.48 liters (today)    FEV1 Pred: 2.84 liters (today)    Asthma Control Assessment: Not Well Controlled   Preventive Screening-Counseling & Management  Alcohol-Tobacco     Alcohol drinks/day: 0     Smoking Status: never  Current Medications (verified): 1)  Singulair 10 Mg Tabs (Montelukast Sodium) .Marland Kitchen.. 1 By Mouth At Bedtime 2)  Zyrtec Allergy 10 Mg Tbdp (Cetirizine Hcl) .... One Daily 3)  Symbicort 160-4.5 Mcg/act Aero (Budesonide-Formoterol Fumarate) .... 2 Puffs Two Times A Day 4)  Nasonex 50 Mcg/act Susp (Mometasone Furoate) .... 2 Sprays Each Nostril Two Times A Day  X 10 Days Then Once Daily 5)  Xopenex Hfa 45 Mcg/act Aero (Levalbuterol Tartrate) .... As Needed 6)  Xopenex 1.25 Mg/34ml Nebu (Levalbuterol Hcl) .Marland Kitchen.. 1 Vial Up To Three Times A Day in Nebulizer As Needed 7)  Hydrochlorothiazide 25 Mg Tabs (Hydrochlorothiazide) .Marland Kitchen.. 1 By Mouth Daily 8)  Qvar 80 Mcg/act Aers (Beclomethasone Dipropionate) .... Inhale 2 Puffs Two Times A Day  Allergies (verified): 1)  ! * Latex  Past History:  Past Medical History: Last updated: 10/17/2008 Current Problems:  ASTHMA (ICD-493.90) ALLERGIC RHINITIS (ICD-477.9) Hypertension  Past  Surgical History: Last updated: 10/17/2008 Csection oral surgery  Family History: Last updated: 10/17/2008 Family History Asthma mother Family History Coronary Heart Disease  MGM  Clotting disorder MGM  Social History: Last updated: 10/17/2008 [place] [employer] in [employer] Married one daughter  no  pets never smoker  Risk Factors: Alcohol Use: 0 (08/16/2010)  Risk Factors: Smoking Status: never (08/16/2010)  Review of Systems      See HPI       The patient complains of shortness of breath with activity, productive cough, non-productive cough, nasal congestion/difficulty breathing through nose, sneezing, and change in color of mucus.  The patient denies shortness of breath at rest, coughing up blood, chest pain, irregular heartbeats, acid heartburn, indigestion, loss of appetite, weight change, abdominal pain, difficulty swallowing, sore throat, tooth/dental problems, headaches, ear ache, rash, and fever.    Vital Signs:  Patient profile:   40 year old female Height:      63.5 inches Weight:      263 pounds BMI:     46.02 O2 Sat:      99 % on Room air Pulse rate:   119 / minute BP sitting:   136 / 82  (left arm) Cuff size:   large  Vitals Entered By: Reynaldo Minium CMA (August 16, 2010 3:53 PM)  O2 Flow:  Room air CC: Acute visit-started feeling bad Tuesday(start menstrual cycle); vomiting,slight fever Wednesday,cough-dark yellow in color; Increased Wheezing today; symtpoms worse at night.   Physical Exam  Additional Exam:  Gen: Pleasant obese , in no distress,  normal affect ENT:  mouth clear,  oropharynx clear ,nasal turbinates pale shiney, Neck: No JVD, no TMG, no carotid bruits Lungs:Bilateral I&E wheeze, short sentences, cough Cardiovascular: RRR, heart sounds normal, no murmur or gallops, no peripheral edema Abdomen: soft and NT, no HSM,  BS normal, obese  Musculoskeletal: No deformities, no cyanosis or clubbing Neuro: alert, non focal Skin: Warm, no lesions or rashes    Pre-Spirometry FEV1    Value: 2.48 L     Pred: 2.84 L     Impression & Recommendations:  Problem # 1:  EXTRINSIC ASTHMA, UNSPECIFIED (ICD-493.00) Acute exacerbation of potentially severe asthma, probably viral triggered.  She may have enough bacterial load to treat with Zpak. We will  give steroid inj and taper. She has home neb and is appropriate about med use. Discussed ER if no better, going into weekend. Denies hx of trouble with albuterol neb.  Medications Added to Medication List This Visit: 1)  Prednisone 10 Mg Tabs (Prednisone) .Marland Kitchen.. 1 tab four times daily x 2 days, 3 times daily x 2 days, 2 times daily x 2 days, 1 time daily x 2 days 2)  Zithromax Z-pak 250 Mg Tabs (Azithromycin) .... 2 today then one daily  Other Orders: Est. Patient Level III (45409) Admin of Therapeutic Inj  intramuscular or subcutaneous (81191) Depo- Medrol 80mg  (J1040) Nebulizer Tx (47829) Albuterol Sulfate Sol 1mg  unit dose (F6213)  Patient Instructions: 1)  Please schedule a follow-up appointment in 1 month. 2)  neb a  3)  depo 80 4)  scripts for singulair, prednisone and Z pak sent to drug store Prescriptions: SINGULAIR 10 MG TABS (MONTELUKAST SODIUM) 1 by mouth at bedtime  #30 Tablet x prn   Entered and Authorized by:   Waymon Budge MD   Signed by:   Waymon Budge MD on 08/16/2010   Method used:   Electronically to  [facility] [phone][facility] (retail)       [address]       [place], [place]  [address]       Ph: [phone] or [phone]       Fax: [phone]   RxID:   [id number] ZITHROMAX Z-PAK 250 MG TABS (AZITHROMYCIN) 2 today then one daily  #1 pak x 0   Entered and Authorized by:   Waymon Budge MD   Signed by:   Waymon Budge MD on 08/16/2010   Method used:   Electronically to        [facility] [phone]* (retail)       [address]       [place], [place]  [address]       Ph: [phone] or [phone]       Fax: [phone]   RxID:   [id number] PREDNISONE 10 MG TABS (PREDNISONE) 1 tab four times daily x 2 days, 3 times daily x 2 days, 2 times daily x 2 days, 1 time daily x 2 days  #20 x 0   Entered and Authorized by:   Waymon Budge MD   Signed by:   Waymon Budge MD on 08/16/2010   Method used:   Electronically to        [facility]  [phone]* (retail)       [address]       [place], [place]  [address]       Ph: [phone] or [phone]       Fax: [phone]   RxID:   [id number]      Medication Administration  Injection # 1:    Medication: Depo- Medrol 80mg     Diagnosis: EXTRINSIC ASTHMA, UNSPECIFIED (ICD-493.00)    Route: SQ    Site: LUOQ gluteus    Exp Date: 01/2013    Lot #: [id number]    Mfr: Pharmacia    Patient tolerated injection without complications    Given by: Reynaldo Minium CMA (August 16, 2010 4:44 PM)  Medication # 1:    Medication: Albuterol Sulfate Sol 1mg  unit dose    Diagnosis: EXTRINSIC ASTHMA, UNSPECIFIED (ICD-493.00)    Dose: 1 vial    Route: inhaled    Exp Date: 08/2011    Lot #: [id number]    Mfr: nephron    Patient tolerated medication without complications    Given by: Reynaldo Minium CMA (August 16, 2010 4:45 PM)  Orders Added: 1)  Est. Patient Level III [61443] 2)  Admin of Therapeutic Inj  intramuscular or subcutaneous [96372] 3)  Depo- Medrol 80mg  [J1040] 4)  Nebulizer Tx [15400] 5)  Albuterol Sulfate Sol 1mg  unit dose [Q6761]

## 2010-08-22 NOTE — Letter (Signed)
Summary: Out of Work  [facility]  [address]   [place], [place] [address]   Phone: [phone]  Fax: [phone]    August 16, 2010   Employee:  EATHEL PAJAK    To Whom It May Concern:   For Medical reasons, please excuse the above named employee from work for the following dates:  Start:   Tuesday 08-13-2010  End:   Monday 08-19-2010  Pt was seen on 08-16-2010 for acute visit.If you need additional information, please feel free to contact our office.         Sincerely,      Jason Coop

## 2010-08-28 NOTE — Letter (Signed)
Summary: Out of Work  [facility]  [address]   [place], [place] [address]   Phone: [phone]  Fax: [phone]    August 19, 2010   Employee:  GAILYN CROOK    To Whom It May Concern:   For Medical reasons, please excuse the above named employee from work for the following dates:  Start: August 13, 2010  Return on Thursday, August 21, 2010.    If you need additional information, please feel free to contact our office.         Sincerely,    Jetty Duhamel, MD

## 2010-08-28 NOTE — Progress Notes (Signed)
Summary: work note extension  Phone Note Call from Patient   Caller: Patient Call For: Kahiau Schewe Summary of Call: pt needs her leave extended- will need another note. c/o SOB. feels she shouldn't go out in the cold today- also gets SOB when going up stairs. requests to speak to [facility]. pt # [id number]. [facility] [address]  Initial call taken by: Tivis Ringer, CNA,  August 19, 2010 8:51 AM  Follow-up for Phone Call        Spoke with pt and she states she is feeling some better, but does not feel like she should go back to work yet and does not want to go out in cold weather. She is requesting an extension to her work note. She wants to return on Wed. Pt husband will come get letter. Please advise. Carron Curie CMA  August 19, 2010 11:34 AM   Additional Follow-up for Phone Call Additional follow up Details #1::        OK- - given to Crystal Additional Follow-up by: Waymon Budge MD,  August 19, 2010 1:13 PM     Appended Document: work note extension Per CDY, ok to extend work note thru Wednesday, Aug 21, 2010.  Pt aware and requesting to have letter placed at front so either her husband or brother can pick it up.  Letter completed and placed at front -- pt aware

## 2010-09-17 ENCOUNTER — Encounter: Payer: Self-pay | Admitting: Internal Medicine

## 2010-09-17 ENCOUNTER — Ambulatory Visit (INDEPENDENT_AMBULATORY_CARE_PROVIDER_SITE_OTHER): Payer: BC Managed Care – PPO | Admitting: Internal Medicine

## 2010-09-17 DIAGNOSIS — J45909 Unspecified asthma, uncomplicated: Secondary | ICD-10-CM

## 2010-09-17 DIAGNOSIS — J309 Allergic rhinitis, unspecified: Secondary | ICD-10-CM

## 2010-09-17 DIAGNOSIS — G4733 Obstructive sleep apnea (adult) (pediatric): Secondary | ICD-10-CM

## 2010-09-24 NOTE — Assessment & Plan Note (Signed)
Summary: rov 1 month ///kp   Copy to:  Dr. [place] Callas Primary Provider/Referring Provider:  Dr. Shaune Pollack  CC:  80month follow up.  Pt states she is waking up at night with SOB, wheezing, and itchy eyes - onset Friday.Marland Kitchen  History of Present Illness:  August 16, 2010- Asthma, Allergic Rhinitis.............Marland Kitchenhusband here Nurse-CC: Acute visit-started feeling bad Tuesday(start menstrual cycle); vomiting,slight fever Wednesday,cough-dark yellow in color; Increased Wheezing today; symtpoms worse at night.// Acute visit- 3 days onset like a cold. Went home early from work. Used neb x several. Over last 2 days head and chest congested. N&V yesterday. Not pregnant. Denies muscle aches. Had 2 days of fever, chills. White/ dark yellow.. Started mucinex.  Peak flow today 125/ good = 350. Uses Symbicort. Did have flu vax.   September 17, 2010- Asthma, Allergic Rhinitis (Dr [place] Callas).............Marland Kitchenhusband here Nurse-CC: 80month follow up.  Pt states she is waking up at night with SOB, wheezing, itchy eyes - onset Friday. Went back to work. Feels better than last visit, but reports that in the last 4-5 days she is waking at night short of breath, wheezing, itching eyes. Last 2 nights took neb- helped. Sleeps propped. Denies heartburn. Using her neb at bedtime, rescue inhaler in Am. Peak flow 175/ PB 350. Using meds regularly including Singulair and Zyrtec, Symbicort and her xopenex rescue- daily. She feels this is allergy. Dr  Callas has update allergy testing planed in May. Husband reports snoring. We discussed NPSG in detail and they are considering.      Asthma History    Asthma Control Assessment:    Age range: 12+ years    Symptoms: >2 days/week    Nighttime Awakenings: 1-3/week    Interferes w/ normal activity: no limitations    SABA use (not for EIB): >2 days/week    FEV1: 2.48 liters (today)    FEV1 Pred: 2.84 liters (today)    Asthma Control Assessment: Not Well Controlled   Preventive  Screening-Counseling & Management  Alcohol-Tobacco     Alcohol drinks/day: 0     Smoking Status: never  Current Medications (verified): 1)  Singulair 10 Mg Tabs (Montelukast Sodium) .Marland Kitchen.. 1 By Mouth At Bedtime 2)  Zyrtec Allergy 10 Mg Tbdp (Cetirizine Hcl) .... One Daily 3)  Symbicort 160-4.5 Mcg/act Aero (Budesonide-Formoterol Fumarate) .... 2 Puffs Two Times A Day 4)  Nasonex 50 Mcg/act Susp (Mometasone Furoate) .... 2 Sprays Each Nostril Two Times A Day  X 10 Days Then Once Daily 5)  Xopenex Hfa 45 Mcg/act Aero (Levalbuterol Tartrate) .... As Needed 6)  Xopenex 1.25 Mg/88ml Nebu (Levalbuterol Hcl) .Marland Kitchen.. 1 Vial Up To Three Times A Day in Nebulizer As Needed 7)  Hydrochlorothiazide 25 Mg Tabs (Hydrochlorothiazide) .Marland Kitchen.. 1 By Mouth Daily 8)  Qvar 80 Mcg/act Aers (Beclomethasone Dipropionate) .... Inhale 2 Puffs Two Times A Day  Allergies (verified): 1)  ! * Latex  Past History:  Past Medical History: Last updated: 10/17/2008 Current Problems:  ASTHMA (ICD-493.90) ALLERGIC RHINITIS (ICD-477.9) Hypertension  Past Surgical History: Last updated: 10/17/2008 Csection oral surgery  Family History: Last updated: 10/17/2008 Family History Asthma mother Family History Coronary Heart Disease  MGM  Clotting disorder MGM  Social History: Last updated: 10/17/2008 [place] [employer] in [employer] Married one daughter  no pets never smoker  Risk Factors: Alcohol Use: 0 (09/17/2010)  Risk Factors: Smoking Status: never (09/17/2010)  Review of Systems      See HPI       The patient complains of shortness  of breath at rest and nasal congestion/difficulty breathing through nose.  The patient denies shortness of breath with activity, productive cough, non-productive cough, coughing up blood, chest pain, irregular heartbeats, acid heartburn, indigestion, loss of appetite, weight change, abdominal pain, difficulty swallowing, sore throat, tooth/dental problems, headaches, and sneezing.     Vital Signs:  Patient profile:   40 year old female Height:      64 inches Weight:      263.50 pounds BMI:     45.39 O2 Sat:      96 % on Room air Pulse rate:   95 / minute BP sitting:   144 / 76  (left arm) Cuff size:   large  Vitals Entered By: Gweneth Dimitri RN (September 17, 2010 2:32 PM)  O2 Flow:  Room air CC: 32month follow up.  Pt states she is waking up at night with SOB, wheezing, itchy eyes - onset Friday. Comments Medications reviewed with patient Daytime contact number verified with patient. Gweneth Dimitri RN  September 17, 2010 2:33 PM    Physical Exam  Additional Exam:  Gen: Pleasant obese , in no distress,  normal affect, obese ENT:  mouth clear,  oropharynx clear ,nasal turbinates pale shiney,conjunctivae mildyly injecrted with clear watery secretions Neck: No JVD, no TMG, no carotid bruits Lungs:very clear with normal airflow for body habitus Cardiovascular: RRR, heart sounds normal, no murmur or gallops, no peripheral edema Abdomen: soft and NT, no HSM,  BS normal, obese  Musculoskeletal: No deformities, no cyanosis or clubbing Neuro: alert, non focal Skin: Warm, no lesions or rashes    Pre-Spirometry FEV1    Value: 2.48 L     Pred: 2.84 L     Impression & Recommendations:  Problem # 1:  ALLERGIC RHINITIS (ICD-477.9)  Increased rhinitis c/w seasonal allergic rhinitis. Dr Fallon Callas will be skin testing., Meanwhile I gave info on HEPA filter and sample Astepro. Her updated medication list for this problem includes:    Zyrtec Allergy 10 Mg Tbdp (Cetirizine hcl) ..... One daily    Nasonex 50 Mcg/act Susp (Mometasone furoate) .Marland Kitchen... 2 sprays each nostril two times a day  x 10 days then once daily  Problem # 2:  ASTHMA (ICD-493.90) She reports unsuccessfull 6 month trial of Xolair in past. Discussed, since IgE was 700's.   Problem # 3:  ? of OBSTRUCTIVE SLEEP APNEA (ICD-327.23) Snoring, maybe some witnessed apnea. Daytime tirednes and obesity. I gave long  explanation and they will consider sleep study. CPAP might help improve breathing at night..  Other Orders: Est. Patient Level IV ([address])  Patient Instructions: 1)  Please schedule a follow-up appointment in 3 months. 2)  Consider the dust control measures - print information 3)  Consider sleep study looking for sleep apnea- booklet  4)  Sample Astepro nasal antihistamine spray 5)    1-2 sprays each nostril, once or twice daily, if extra antihistamine is needed.  6)  Symbicort and Qvar refilled Prescriptions: QVAR 80 MCG/ACT AERS (BECLOMETHASONE DIPROPIONATE) Inhale 2 puffs two times a day  #1 x 6   Entered and Authorized by:   Waymon Budge MD   Signed by:   Waymon Budge MD on 09/17/2010   Method used:   [facility] to        [facility] [phone]* (retail)       [address]       [place], [place]  [address]       Ph: [phone] or [phone]  Fax: [phone]   RxID:   [id number] SYMBICORT 160-4.5 MCG/ACT AERO (BUDESONIDE-FORMOTEROL FUMARATE) 2 puffs two times a day  #1 x 6   Entered and Authorized by:   Waymon Budge MD   Signed by:   Waymon Budge MD on 09/17/2010   Method used:   Electronically to        [facility] [phone][facility] (retail)       [address]       [place], [place]  [address]       Ph: [phone] or [phone]       Fax: [phone]   RxID:   [id number]

## 2010-10-21 LAB — GLUCOSE, CAPILLARY
Glucose-Capillary: 154 mg/dL — ABNORMAL HIGH (ref 70–99)
Glucose-Capillary: 157 mg/dL — ABNORMAL HIGH (ref 70–99)
Glucose-Capillary: 186 mg/dL — ABNORMAL HIGH (ref 70–99)
Glucose-Capillary: 195 mg/dL — ABNORMAL HIGH (ref 70–99)
Glucose-Capillary: 222 mg/dL — ABNORMAL HIGH (ref 70–99)
Glucose-Capillary: 240 mg/dL — ABNORMAL HIGH (ref 70–99)
Glucose-Capillary: 242 mg/dL — ABNORMAL HIGH (ref 70–99)
Glucose-Capillary: 256 mg/dL — ABNORMAL HIGH (ref 70–99)
Glucose-Capillary: 268 mg/dL — ABNORMAL HIGH (ref 70–99)
Glucose-Capillary: 269 mg/dL — ABNORMAL HIGH (ref 70–99)
Glucose-Capillary: 297 mg/dL — ABNORMAL HIGH (ref 70–99)

## 2010-10-21 LAB — BASIC METABOLIC PANEL
BUN: 8 mg/dL (ref 6–23)
CO2: 27 mEq/L (ref 19–32)
Calcium: 9.1 mg/dL (ref 8.4–10.5)
Chloride: 103 mEq/L (ref 96–112)
Creatinine, Ser: 0.63 mg/dL (ref 0.4–1.2)
GFR calc Af Amer: >60 mL/min (ref >60)
GFR calc non Af Amer: >60 mL/min (ref >60)
Glucose, Bld: 83 mg/dL (ref 70–99)
Potassium: 3.6 mEq/L (ref 3.5–5.1)
Sodium: 138 mEq/L (ref 135–145)

## 2010-10-21 LAB — CBC
HCT: 40.3 % (ref 36.0–46.0)
Hemoglobin: 13.4 g/dL (ref 12.0–15.0)
MCHC: 33.2 g/dL (ref 30.0–36.0)
MCV: 91.6 fL (ref 78.0–100.0)
Platelets: 286 10*3/uL (ref 150–400)
RBC: 4.41 MIL/uL (ref 3.87–5.11)
RDW: 14 % (ref 11.5–15.5)
WBC: 14.9 10*3/uL — ABNORMAL HIGH (ref 4.0–10.5)

## 2010-10-21 LAB — CARDIAC PANEL(CRET KIN+CKTOT+MB+TROPI)
CK, MB: 1.4 ng/mL (ref 0.3–4.0)
CK, MB: 2.4 ng/mL (ref 0.3–4.0)
Relative Index: 1.3 (ref 0.0–2.5)
Relative Index: 2.3 (ref 0.0–2.5)
Total CK: 105 U/L (ref 7–177)
Total CK: 108 U/L (ref 7–177)
Troponin I: 0.01 ng/mL (ref 0.00–0.06)
Troponin I: 0.06 ng/mL (ref 0.00–0.06)

## 2010-10-21 LAB — D-DIMER, QUANTITATIVE: D-Dimer, Quant: 0.34 ug/mL-FEU (ref 0.00–0.48)

## 2010-11-16 IMAGING — CR DG CHEST 1V PORT
1 series · 1 of 1 positions shown · non-contrast
Comparison: Two-view chest x-ray 07/31/2008.

CLINICAL DATA: Asthma exacerbation with shortness of breath.

PORTABLE CHEST - 1 VIEW [DATE]/7252 5217 hours:

[view not recorded]
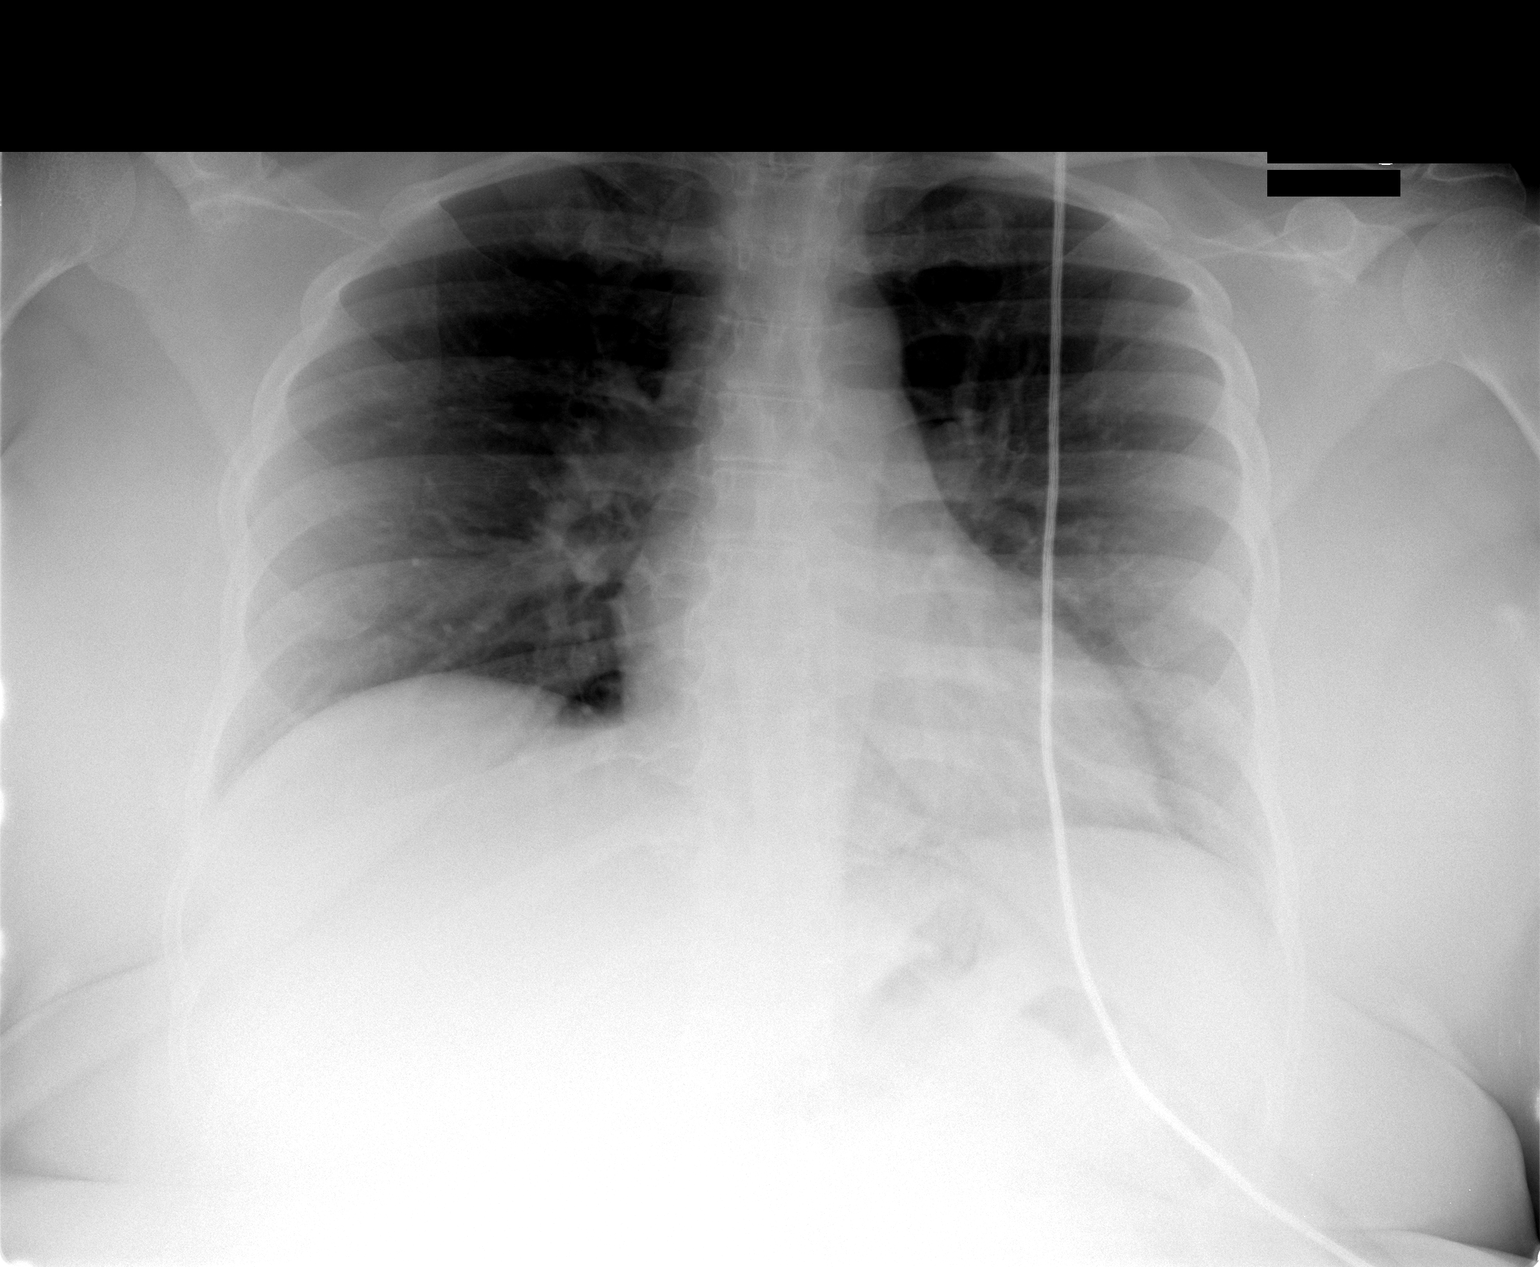

[1 of 1 positions shown; findings below may reference images not displayed]

FINDINGS: Suboptimal inspiration due to body habitus accounts for
the crowded bronchovascular markings diffusely and accentuates the
heart size; taking this into account, heart size normal and lungs
clear.  Stable chronic elevation of the right hemidiaphragm.
IMPRESSION: Suboptimal inspiration.  No acute cardiopulmonary disease.

## 2010-11-19 NOTE — H&P (Signed)
NAMEJAQUANNA, Sylvia [name]             ACCOUNT NO.:  [id number]   MEDICAL RECORD NO.:  [id number]          PATIENT TYPE:  INP   LOCATION:  1335                         FACILITY[facility]  [facility]   PHYSICIAN:  Corinna L. Lendell Caprice, MDDATE OF BIRTH:  July 03, 1971   DATE OF ADMISSION:  07/31/2008  DATE OF DISCHARGE:                              HISTORY & PHYSICAL   CHIEF COMPLAINT:  Shortness of breath.   HISTORY OF PRESENT ILLNESS:  Sylvia [name] is a 40 year old black female  with a history of asthma who was directly admitted from Dr. Kevan Ny'  office with status asthmaticus.  She has reportedly had increased wheeze  and shortness of breath for 4-6 weeks.  However, it has accelerated over  the past several days.  She has a cough productive of purulent sputum.  She has had no fevers or chills.  She has not been on antibiotics or  steroids recently.  She has never been intubated.  Her appetite has been  marginal.  She has had no diarrhea.  No sore throat.  No sinus  congestion.  She has had a funny feeling in her right ear.  She was  noted to be tachypneic in the office with respiratory rate of 32, a  temperature of 99.8, heart rate of 109 and was directly admitted to the  hospital.  The patient has a nebulizer machine at home that she has been  using more frequently.  She is also on multiple other medications for  her asthma.  She has no smoking history.   PAST MEDICAL HISTORY:  1. Asthma.  2. Hypertension.  3. Depression.  4. Obesity.   MEDICATIONS:  1. Xopenex 2 puffs as needed.  2. Multivitamin.  3. Zoloft 50 mg a day.  4. KCl 10 mEq a day.  5. Singulair 10 mg a day.  6. Clarinex 5 mg daily.  7. Camila 1 daily.  8. Hydrochlorothiazide 25 mg a day.  9. Symbacort 160/4.5 two puffs daily.  10.Proair 2 puffs as needed for shortness of breath.  11.Flonase daily.   No known drug allergies.   SOCIAL HISTORY:  She is married and has a child.  She does not smoke,  drink or use drugs.   FAMILY  HISTORY:  Noncontributory.   REVIEW OF SYSTEMS:  As above, otherwise negative.   PHYSICAL EXAMINATION:  VITAL SIGNS:  Her temperature is 99.1,  respiratory rate about 30, blood pressure 148/92, oxygen saturation 99%  on room air.  GENERAL:  The patient is in mild to moderate respiratory distress.  She  coughs frequently.  She is able to speak in short sentences.  HEENT: Normocephalic, atraumatic.  Pupils equal, round, reactive to  light.  She has no tenderness about the right ear to palpation.  She  does have some erythema involving the canal of the right ear her left  ear is clear.  She has moist mucous membranes.  Oropharynx is without  erythema or exudate.  NECK:  Neck is thick, supple.  LUNGS: Expiratory wheeze and prolonged expiratory phase, tachypneic.  ABDOMEN:  Obese, soft, nontender.  CARDIOVASCULAR:  Regular rate and  rhythm without murmurs, gallops or  rubs.  EXTREMITIES: No clubbing, cyanosis or edema.  NEUROLOGIC:  Alert and oriented.  Cranial nerves and sensorimotor exam  are intact.  PSYCHIATRIC:  Normal affect, calm and cooperative.  SKIN: No rash.   Labs are pending.  Chest x-ray is pending.   ASSESSMENT/PLAN:  1. Status asthmaticus secondary to acute bronchitis, rule out      pneumonia.  I will check a CBC, chest x-ray, baseline labs.  Start      azithromycin, nebulizers every 4 hours and IV steroids.  2. Depression.  3. Hypertension.  4. Obesity.  5. Right otitis externa.  I will give Ciprodex drops.   Her outpatient medications will be continued, and I will monitor CBG's  while on steroids.      Corinna L. Lendell Caprice, MD  Electronically Signed     CLS/MEDQ  D:  08/01/2008  T:  08/01/2008  Job:  [id number]   cc:   Duncan Dull, M.D.  Fax: [phone]

## 2010-11-19 NOTE — Discharge Summary (Signed)
Sylvia [name], Sylvia [name]             ACCOUNT NO.:  [id number]   MEDICAL RECORD NO.:  [id number]          PATIENT TYPE:  INP   LOCATION:  1335                         FACILITY:  [facility]   PHYSICIAN:  Corinna L. Lendell Caprice, MDDATE OF BIRTH:  11-Jun-1971   DATE OF ADMISSION:  07/31/2008  DATE OF DISCHARGE:  08/03/2008                               DISCHARGE SUMMARY   DISCHARGE DIAGNOSES:  1. Status asthmaticus.  2. Acute bronchitis.  3. Gastroesophageal reflux disease.  4. Anxiety.  5. Obesity  6. Steroid-induced hyperglycemia.  7. Depression.  8. Hypertension.Marland Kitchen   DISCHARGE MEDICATIONS:  1. Azithromycin 250 mg p.o. for one more day.  2. Prednisone taper as directed.  3. Ativan 0.5 mg every 8 hours as needed for anxiety.  4. Prilosec 20 mg a day.  5. Continue Xopenex inhaler and/or nebulizers every 4 hours as needed      for wheeze or shortness of breath.  6. Multivitamin a day.  7. Zoloft 50 mg a day.  8. KCl 10 mEq a day.  9. Singulair 10 mg a day.  10.Clarinex 5 mg a day.  11.Camilla daily.  12.Hydrochlorothiazide 25 mg a day.  13.Symbicort 2 puffs daily.  14.ProAir 2 puffs every 4 hours as needed.  15.Flonase daily.   CONDITION:  Stable.   ACTIVITY:  Increase as tolerated.  She may return to work in 2 weeks.  Diet should be low salt, low calorie.   CONSULTATIONS:  None.   PROCEDURES:  None.   FOLLOW UP:  With Dr. Kevan Ny 1 to 2 weeks.   LABS:  White count on admission was 14,900 the rest of her CBC was  unremarkable.  Basic metabolic panel unremarkable.  Two sets of point of  care enzymes negative.  D-dimer normal.   SPECIAL STUDIES:  Radiology, EKG on January 26 showed sinus tachycardia  with mildly flipped T-waves inferiorly.  Chest x-ray 2 views on  admission showed no pneumonia.  Central airway inflammation consistent  with asthma exacerbation.   HISTORY AND HOSPITAL COURSE:  Sylvia [name] is a pleasant 40 year old  black female who was directly admitted from Dr.  Kevan Ny' office.  She had  been battling asthma exacerbation on her own it sounds like for 4 to 6  weeks.  She also had a cough productive of purulent sputum.  When she  finally went to Dr. Kevan Ny' office, she was noted to be tachypneic with a  respiratory rate of 36.  Her oxygen saturations were normal and she was  able to ambulate and talk.  She was directly admitted to the floor,  started on IV Solu-Medrol, frequent bronchodilators, and sliding scale  insulin as needed for hyperglycemia.  She has no history of diabetes.  She also was started on azithromycin.  Initially on admission she was  tachypneic and had bilateral expiratory wheeze and prolonged expiratory  phase.  At the time of discharge, her lungs were clear with good air  movement.  Her respiratory rate and the rest her vitals are  unremarkable.   She did have problems with severe anxiety during the hospitalization, I  suspect secondary  to not only the asthma but high dose steroids and  frequent nebulizers.  This improved as her steroids were tapered and her  nebulizers were spaced less frequently.  Her anxiety responded well to  Ativan.  She had several episodes of chest pain.  Cardiac enzymes were  negative.  D-dimer normal.  I suspect this was acid reflux worsened by  steroids.  She was started on b.i.d. Protonix and I have recommended  that she continue Prilosec for a month, but possibly longer if needed.  Her other medical problems remained stable during the hospitalization.      Corinna L. Lendell Caprice, MD  Electronically Signed     CLS/MEDQ  D:  08/03/2008  T:  08/03/2008  Job:  [id number]   cc:   Duncan Dull, M.D.  Fax: [phone]

## 2010-11-22 NOTE — Discharge Summary (Signed)
Sylvia [name], Sylvia [name]             ACCOUNT NO.:  [id number]   MEDICAL RECORD NO.:  [id number]          PATIENT TYPE:  INP   LOCATION:  9141                          FACILITY:  [facility]   PHYSICIAN:  Crist Fat. Rivard, M.D. DATE OF BIRTH:  [birth year]   DATE OF ADMISSION:  10/02/2004  DATE OF DISCHARGE:  10/07/2004                                 DISCHARGE SUMMARY   ADMISSION DIAGNOSES:  1.  Intrauterine pregnancy at term.  2.  Pregnancy-induced hypertension without preeclampsia.  3.  Unfavorable cervix.   DISCHARGE DIAGNOSES:  1.  Intrauterine pregnancy at term.  2.  Pregnancy-induced hypertension without preeclampsia.  3.  Unfavorable cervix.  4.  Status post induction of labor attempt.  5.  Status post low transverse cesarean section for delivery of a viable      female infant named Sylvia [name] and for failure to progress in labor.  6.  Breast pumping, infant in the NICU.  7.  Undecided regarding contraception.   PROCEDURE:  Primary low transverse cesarean section for delivery of a viable  female infant named Sylvia [name] who weighed 9 pounds 3 ounces and had Apgars of 4  and 9.  Was attended in delivery by Janine Limbo, M.D. and Elby Showers.  Mayford Knife, C.N.M.   HOSPITAL COURSE:  Ms. Kimbrough is a 40 year old married black female,  gravida 1, para 0, at 38-5/7 weeks who was admitted for induction of labor  secondary to pregnancy-induced hypertension without evidence of  preeclampsia.  She was started on Cytotec through the night of October 02, 2004, to October 03, 2004, and was started on Pitocin in the a.m. of October 03, 2004.  She underwent spontaneous rupture of membranes at about 4 p.m. on  October 03, 2004, and was 1 to 2 cm, and 80% at that time.  She received an  epidural for labor discomfort around 7 p.m. on October 03, 2004, was still 2  cm at that time and an IUPC was inserted in order to augment her labor.  She  continued on Pitocin augmentation and reached 5 cm by 6:50 a.m. on October 04, 2004, however, never proceeded to progress beyond 5 cm despite increasing  her Pitocin to attempt adequate labor.  She also never fully developed  adequate MVU's but the fetal heart rate developed some variable  decelerations and with failure to progress in labor, the recommendation was  to proceed with a cesarean section for delivery.  She underwent the same for  delivery of a viable female infant named Sylvia [name] who weighed 9 pounds 3  ounces and had Apgars of 4 and 9 on October 04, 2004, attended in delivery by  Dr. Stefano Gaul and Elby Showers. [name], C.N.M.  Postoperatively, she is  ambulating, voiding, and eating without difficulty.  Her vital signs are  stable and she has been afebrile throughout her postoperative course.  Her  blood pressures are stable in the 140's to 150's over 70's to 90's.  She is  breast pumping with minimal return at this point.  Her infant is stable in  the NICU with a rule  out sepsis workup and currently on oxygen by nasal  cannula and tube feeding.  The patient is deemed ready for discharge today.   DISCHARGE INSTRUCTIONS:  As per the [facility] handout.   DISCHARGE MEDICATIONS:  1.  Motrin 600 mg p.o. q.6h p.r.n. for pain.  2.  Tylox one to two p.o. q.4-6h p.r.n. for pain.  3.  Hydrochlorothiazide 50 mg p.o. daily.  4.  Prenatal vitamins daily.   DISCHARGE LABORATORY DATA:  Hemoglobin is 10.7, WBC count is 20.2, and her  platelets are 152.  Her discharge weight is 282.   FOLLOW UP:  Will be in 1 week at [facility] or p.r.n.   CONDITION ON DISCHARGE:  Well and stable.      SJD/MEDQ  D:  10/07/2004  T:  10/07/2004  Job:  [id number]

## 2010-11-22 NOTE — Op Note (Signed)
NAMEDANEKA, LANTIGUA             ACCOUNT NO.:  [id number]   MEDICAL RECORD NO.:  [id number]          PATIENT TYPE:  INP   LOCATION:  9160                          FACILITY:  [facility]   PHYSICIAN:  Janine Limbo, M.D.DATE OF BIRTH:  Jun 20, 1971   DATE OF PROCEDURE:  10/04/2004  DATE OF DISCHARGE:                                 OPERATIVE REPORT   PREOPERATIVE DIAGNOSES:  1.  Term intrauterine pregnancy.  2.  Failure to progress in labor.  3.  Pregnancy-induced hypertension.  4.  Obesity (weight 264 pounds).   POSTOPERATIVE DIAGNOSES:  1.  Term intrauterine pregnancy.  2.  Failure to progress in labor.  3.  Pregnancy-induced hypertension.  4.  Obesity (weight 264 pounds).  5.  Fibroid uterus.  6.  Macrosomia.  7.  Persistent occiput posterior presentation.   PROCEDURE:  Primary low transverse cesarean section.   SURGEON:  Dr. Leonard Schwartz   FIRST ASSISTANT:  Wynelle Bourgeois, CNM.   ANESTHETIC:  Epidural.   DISPOSITION:  Ms. Bresee is a 40 year old female, gravida 1, para 0, who  presents at 38-1/2 weeks' gestation with pregnancy-induced hypertension.  She was admitted for induction of labor on October 02, 2004.  The patient  progressed to 5 cm but remained there for greater than 7 hours.  The  decision was made to proceed with cesarean delivery.  The risk and benefits  of the procedure were reviewed including, but not limited to, anesthetic  complications, bleeding, infection, and possible damage to the surrounding  organs.   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  The Apgars were 4 at one minute and 9 at 5  minutes.  The arterial cord blood pH was 7.20.  There were two fibroids on  the posterior fundus of the uterus that measured approximately 2 cm each.  The fallopian tubes and the ovaries were normal for the gravid state.   PROCEDURE:  The patient was taken to the operating room where it  was  determined that the epidural she had received for labor would be adequate  for cesarean delivery.  The patient's abdomen was prepped with multiple  layers of Betadine.  A Foley catheter had previously been placed.  The  patient was then sterilely draped.  The lower abdomen was injected with 10  mL of 0.5% Marcaine with epinephrine.  A low transverse incision was made in  the abdomen and carried sharply through the subcutaneous tissue, the fascia,  and the anterior peritoneum.  The bladder flap was developed.  An incision  was made in the lower uterine segment and extended transversely.  The fetal  head was delivered without difficulty.  The mouth and nose were suctioned.  The remainder of the infant was then delivered.  Routine cord blood studies  were obtained.  The placenta was removed.  The uterine cavity was cleaned of  amniotic fluid, clotted blood, and membranes.  The uterine incision was  closed using a running locking suture of 2-0 Vicryl followed by an  imbricating suture of 2-0 Vicryl.  Hemostasis was achieved using a figure-of-  eight suture of 2-0 Vicryl.  The pelvis was vigorously irrigated.  The  anterior peritoneum was closed using a running suture of 0 Vicryl.  The  abdominal musculature were reapproximated in the midline using 0 Vicryl. The  fascia was closed using a running sutures of 0 Vicryl followed by three  interrupted sutures of 0 Vicryl.  The subcutaneous layer was irrigated.  A  Jackson-Pratt drain was placed in the subcutaneous layer and brought out  through the left lower quadrant.  The Jackson-Pratt drain was sutured into  place using 3-0 silk.  The subcutaneous layer was closed using a running  suture of 0 Vicryl.  The skin was reapproximated using a subcuticular suture  of 4-0 Monocryl.  Sponge, needle, and instrument counts were correct on two  occasions.  The estimated blood loss for the procedure was 800 mL.  The  patient tolerated her procedure well.   She was awakened from her anesthetic  and taken to the recovery room in stable condition.  The infant was taken to  the full-term nursery in stable condition.  The patient was noted to drain  clear yellow urine at the end of her procedure.      AVS/MEDQ  D:  10/04/2004  T:  10/04/2004  Job:  [id number]

## 2010-11-22 NOTE — H&P (Signed)
[name], [name]             ACCOUNT NO.:  [id number]   MEDICAL RECORD NO.:  [id number]          PATIENT TYPE:  MAT   LOCATION:  MATC                          FACILITY:  [facility]   PHYSICIAN:  Crist Fat. Rivard, M.D. DATE OF BIRTH:  06-13-1971   DATE OF ADMISSION:  10/02/2004  DATE OF DISCHARGE:                                HISTORY & PHYSICAL   Ms. Ellinwood is a 40 year old, married, black female, primigravida at 71-  5/7th weeks who presents from the office for evaluation secondary to  elevated blood pressure and 17-pound weight gain in one week.  She reports  dizziness yesterday that is somewhat improved today, but she does have a  mild headache today.  She denies visual disturbances and right upper  quadrant pain.  Denies leaking and bleeding.   Her pregnancy has been followed by the [facility] and has been remarkable for:  1.  Irregular cycles.  2.  Increased body mass index.  3.  Asthma.  4.  Migraines.  5.  Group B strep negative.   PRENATAL LABS:  Were collected, on March 21, 2004, hemoglobin 13.1,  hematocrit 40.9, platelets 261,000.  Blood type B positive.  Antibody  negative.  Sickle cell trait negative.  RPR nonreactive.  Rubella immune.  Hepatitis B surface antigen negative.  One hour Glucola, from July 10, 2004, was 112 with hemoglobin at that time 12.4.  Culture of the vaginal  tract for Group B strep, on September 11, 2004, was negative.   HISTORY OF PRESENT PREGNANCY:  The patient presented for care at [facility], on March 21, 2004, at 10 weeks' gestation.  Pregnancy  ultrasonography at 14 weeks' gestation confirmed [facility] of October 11, 2004.  Ultrasonography again at 18 weeks' gestation once again confirmed her EDC.  At 23 weeks' gestation the patient was prescribed Protonix for her GERD.  Ultrasonography at 30 weeks' gestation was done for size greater than dates,  estimated fetal weight was in the 78th  percentile with normal fluid.  The  patient was referred to [facility] for back pain at 32 weeks' gestation.  The rest  of her prenatal care has been unremarkable.   OBSTETRICAL HISTORY:  She is a gravida 1, para 0.   MEDICAL HISTORY:  1.  She has no medication allergies but is allergic to nuts and bananas      resulting in hives and shortness of breath.  2.  She experienced menarche at the age of 20 with 23 day cycles lasting      five days.  3.  She has taken oral contraceptives in the past.  4.  She reports having had the usual childhood illnesses.  5.  She has a history of asthma.  6.  Migraine headaches.   SURGICAL HISTORY:  Remarkable for wisdom teeth extraction in 2002.   FAMILY MEDICAL HISTORY:  Remarkable for maternal grandmother with MI.  Maternal grandmother with CHF and father with CHF.  Mother with history of  hypertension.  Father is a diabetic.  Maternal grandmother has thyroid  dysfunction.  Father with kidney failure, receiving dialysis.  Maternal  grandmother with dialysis.  Father and maternal grandmother with chronic  renal disease.  Maternal grandmother with CVA.   GENETIC HISTORY:  Negative.   SOCIAL HISTORY:  The patient is married to the father of the baby.  His name  is Earl Lites.  He is involved and supportive.  They are of the [place]  faith.  The patient has 18 years of education, is employed full time as a  Teacher, English as a foreign language.  Father of the baby has 13 years of education and is  employed full time as a Radio producer.  They deny any alcohol,  tobacco, or illicit drug use with the pregnancy.   OBJECTIVE DATA:  VITAL SIGNS:  Blood pressures have been between 147 to 168  over 86 to 98, other vital signs are stable.  She is afebrile.  HEENT:  Grossly within normal limits.  CHEST:  Clear to auscultation.  HEART:  Regular rate and rhythm.  ABDOMEN:  Gravid in contour with a fundal height extending approximately 43-  cm above the pubic symphysis.  Fetal  heart rate is react and reassuring.  She is having occasional mild irregular contractions.  PELVIC:  The cervix is closed, 50%, vertex, -2, -3.  Vertex was confirmed by  bedside ultrasound.  EXTREMITIES:  Remarkable for 3+ edema with 2+ DTRs and no clonus.   PIH laboratories:  Hemoglobin 13.2, hematocrit 39.8, white blood cell count  10.6, platelets 194,000.  Uric acid is 5.3.  SGOT 23, SGPT 19, LDH 182, and  clean catch urinalysis is negative.   ASSESSMENT:  1.  Intrauterine pregnancy at term.  2.  Pregnancy induced hypertension without evidence of pre-eclampsia.  3.  Unfavorable cervix.   PLAN:  1.  Admit to birthing suite for a consult with Dr. Estanislado Pandy.  2.  The patient is to remain in CNM service unless pre-eclampsia develops.  3.  Plan Cytotec tonight.  4.  Repeat PIH labs in the morning.  5.  The patient and the spouse are agreeable with the induction plan.      KS/MEDQ  D:  10/02/2004  T:  10/02/2004  Job:  [id number]

## 2010-12-19 ENCOUNTER — Ambulatory Visit: Payer: BC Managed Care – PPO | Admitting: Internal Medicine

## 2011-01-17 ENCOUNTER — Encounter: Payer: Self-pay | Admitting: Internal Medicine

## 2011-01-20 ENCOUNTER — Encounter: Payer: Self-pay | Admitting: *Deleted

## 2011-01-20 ENCOUNTER — Ambulatory Visit (INDEPENDENT_AMBULATORY_CARE_PROVIDER_SITE_OTHER): Payer: BC Managed Care – PPO | Admitting: Internal Medicine

## 2011-01-20 ENCOUNTER — Encounter: Payer: Self-pay | Admitting: Internal Medicine

## 2011-01-20 VITALS — BP 132/76 | HR 94 | Ht 63.5 in | Wt 258.0 lb

## 2011-01-20 DIAGNOSIS — J309 Allergic rhinitis, unspecified: Secondary | ICD-10-CM

## 2011-01-20 DIAGNOSIS — J45901 Unspecified asthma with (acute) exacerbation: Secondary | ICD-10-CM

## 2011-01-20 MED ORDER — METHYLPREDNISOLONE ACETATE 80 MG/ML IJ SUSP
80.0000 mg | Freq: Once | INTRAMUSCULAR | Status: AC
  Administered 2011-01-20: 80 mg via INTRAMUSCULAR

## 2011-01-20 MED ORDER — LEVALBUTEROL HCL 1.25 MG/3ML IN NEBU: 1.0000 | INHALATION_SOLUTION | Freq: Three times a day (TID) | RESPIRATORY_TRACT | Status: DC | PRN

## 2011-01-20 NOTE — Progress Notes (Signed)
Subjective:    Patient ID: Sylvia [name], female    DOB: March 06, 1971, 40 y.o.   MRN: [id number]  HPI 01/20/11- 14 yoF never smoker followed for asthma, allergic rhinitis Last here September 17, 2010- note reviewed She had been doing very well. Last night home was more humid. They monitor humidity levels.  She had been wheezing and coughing some all day, but woke around 2 AM worse. Husbnd cranked up the dehumidifiers and she took 2 neb treatments 30 minutes apart. After that she calmed down and was able to get back to sleep. Today she is still a little tight.  Over time she has felt she was doing much better, using the combination of symbicort, Qvar and Singulair. Denies recent cold, but daughter had a sinus infection and cough.   Review of Systems Constitutional:   No-   weight loss, night sweats, fevers, chills, fatigue, lassitude. HEENT:   No-   headaches, difficulty swallowing, tooth/dental problems, sore throat,                  No-   sneezing, itching, ear ache, nasal congestion, post nasal drip,   CV:  No-   chest pain, orthopnea, PND, swelling in lower extremities, anasarca, dizziness, palpitations  GI:  No-   heartburn, indigestion, abdominal pain, nausea, vomiting, diarrhea,                 change in bowel habits, loss of appetite  Resp: No-   shortness of breath with exertion or at rest.  No-  excess mucus,             No-  coughing up of blood.              No-   change in color of mucus.      Skin: No-   rash or lesions.  GU: No-   dysuria, change in color of urine, no urgency or frequency.  No- flank pain.  MS:  No-   joint pain or swelling.  No- decreased range of motion.  No- back pain.  Psych:  No- change in mood or affect. No depression or anxiety.  No memory loss.     Objective:   Physical Exam General- Alert, Oriented, Affect-appropriate, Distress- none acute. obese Skin- rash-none, lesions- none, excoriation- none Lymphadenopathy- none Head- atraumatic     Eyes- Gross vision intact, PERRLA, conjunctivae clear secretions            Ears- Hearing, canals            Nose- Clear, No- Septal dev, mucus, polyps, erosion, perforation             Throat- Mallampati II , mucosa clear , drainage- none, tonsils- atrophic Neck- flexible , trachea midline, no stridor , thyroid nl, carotid no bruit Chest - symmetrical excursion , unlabored           Heart/CV- RRR , no murmur , no gallop  , no rub, nl s1 s2                           - JVD- none , edema- none, stasis changes- none, varices- none           Lung- clear to P&A, wheeze- mild, cough- none , dullness-none, rub- none           Chest wall-  Abd- tender-no, distended-no, bowel sounds-present, HSM- no Br/ Gen/ Rectal- Not done,  not indicated Extrem- cyanosis- none, clubbing, none, atrophy- none, strength- nl Neuro- grossly intact to observation         Assessment & Plan:

## 2011-01-20 NOTE — Patient Instructions (Signed)
Depo 80  IM  Refill script for nebulizer solution

## 2011-01-20 NOTE — Assessment & Plan Note (Addendum)
She associates exacerbation yesterday with humid weather, which continues. We agreed to give depo today to add some extra protection. She will continue her routine meds, including the high dose inhaled steroid therapy using both symbicort and Qvar.

## 2011-01-21 NOTE — Assessment & Plan Note (Signed)
Despite ches t symptoms her upper airway seems to be doing pretty well.

## 2011-01-22 ENCOUNTER — Telehealth: Payer: Self-pay | Admitting: Internal Medicine

## 2011-01-22 MED ORDER — AMOXICILLIN-POT CLAVULANATE 875-125 MG PO TABS: 1.0000 | ORAL_TABLET | Freq: Two times a day (BID) | ORAL | Status: AC

## 2011-01-22 MED ORDER — PREDNISONE 10 MG PO TABS: ORAL_TABLET | ORAL | Status: DC

## 2011-01-22 NOTE — Telephone Encounter (Signed)
Called, spoke with pt.  She is aware of CDY's recs.  [facility].  Pt verbalized understanding.

## 2011-01-22 NOTE — Telephone Encounter (Signed)
Called, spoke with pt.  She was seen by CDY on Monday.  States yesterday and today she is "not feeling well."  C/o sinus pressure, nasal congestion, breathing is not as easy, wheezing, and cough.  Cough was prod yesterday with dark yellow mucus but is nonprod today.  Denies fever.  She is requesting CDY's recs - pls advise.  Thanks!  Allergies verified.  [facility].  Allergies  Allergen Reactions  . Latex

## 2011-01-22 NOTE — Telephone Encounter (Signed)
Per CY-okay to give Prednisone 10 mg #20 take 4 x 2 days, 3 x 2 days, 2 x 2 days, 1 x 2 days then stop no refills. Also, okay to give Augmentin 875mg  #14 take 1 po bid no refills.

## 2011-02-16 ENCOUNTER — Other Ambulatory Visit: Payer: Self-pay | Admitting: Critical Care Medicine

## 2011-02-23 ENCOUNTER — Other Ambulatory Visit: Payer: Self-pay | Admitting: Critical Care Medicine

## 2011-03-24 ENCOUNTER — Ambulatory Visit: Payer: BC Managed Care – PPO | Admitting: Internal Medicine

## 2011-04-24 ENCOUNTER — Encounter: Payer: Self-pay | Admitting: *Deleted

## 2011-04-24 ENCOUNTER — Ambulatory Visit (INDEPENDENT_AMBULATORY_CARE_PROVIDER_SITE_OTHER): Payer: BC Managed Care – PPO | Admitting: Internal Medicine

## 2011-04-24 ENCOUNTER — Encounter: Payer: Self-pay | Admitting: Internal Medicine

## 2011-04-24 VITALS — BP 130/80 | HR 108 | Ht 63.5 in | Wt 253.8 lb

## 2011-04-24 DIAGNOSIS — J45901 Unspecified asthma with (acute) exacerbation: Secondary | ICD-10-CM

## 2011-04-24 DIAGNOSIS — J018 Other acute sinusitis: Secondary | ICD-10-CM

## 2011-04-24 MED ORDER — ARFORMOTEROL TARTRATE 15 MCG/2ML IN NEBU
15.0000 ug | INHALATION_SOLUTION | Freq: Once | RESPIRATORY_TRACT | Status: AC
  Administered 2011-04-24: 15 ug via RESPIRATORY_TRACT

## 2011-04-24 MED ORDER — METHYLPREDNISOLONE ACETATE 80 MG/ML IJ SUSP
80.0000 mg | Freq: Once | INTRAMUSCULAR | Status: AC
  Administered 2011-04-24: 80 mg via INTRAMUSCULAR

## 2011-04-24 NOTE — Progress Notes (Signed)
Patient ID: Sylvia [name], female    DOB: 05-Jan-1971, 40 y.o.   MRN: [id number]  HPI 01/20/11- 22 yoF never smoker followed for asthma, allergic rhinitis Last here September 17, 2010- note reviewed She had been doing very well. Last night home was more humid. They monitor humidity levels.  She had been wheezing and coughing some all day, but woke around 2 AM worse. Husband cranked up the dehumidifiers and she took 2 neb treatments 30 minutes apart. After that she calmed down and was able to get back to sleep. Today she is still a little tight.  Over time she has felt she was doing much better, using the combination of symbicort, Qvar and Singulair. Denies recent cold, but daughter had a sinus infection and cough.   04/24/11- [name] never smoker followed for asthma, allergic rhinitis...Marland KitchenMarland Kitchenhere w/ husband and daughter 4 days ago she came down with a cold, shared with her husband. By the next night she had cough and wheezing was using her nebulizer every 8 hours as helps her cough became more productive but makes her shaky. She feels worse lying down and better if she props up to sleep. Some pressure in the right ear and both maxillary sinuses with no purulent discharge and no sore throat.   Review of Systems Constitutional:   No-   weight loss, night sweats, fevers, chills, fatigue, lassitude. HEENT:   No-   headaches, difficulty swallowing, tooth/dental problems, sore throat,                  No-   sneezing, itching, ear ache,   +nasal congestion, post nasal drip,  CV:  No-   chest pain, orthopnea, PND, swelling in lower extremities, anasarca, dizziness, palpitations GI:  No-   heartburn, indigestion, abdominal pain, nausea, vomiting, diarrhea,                 change in bowel habits, loss of appetite Resp: No-   shortness of breath with exertion or at rest.  No-  excess mucus,             No-  coughing up of blood.              No-   change in color of mucus.     Skin: No-   rash or  lesions. GU: No-   dysuria, change in color of urine, no urgency or frequency.  No- flank pain. MS:  No-   joint pain or swelling.  No- decreased range of motion.  No- back pain. Psych:  No- change in mood or affect. No depression or anxiety.  No memory loss.     Objective:   Physical Exam General- Alert, Oriented, Affect-appropriate, Distress- none acute. obese Skin- rash-none, lesions- none, excoriation- none Lymphadenopathy- none Head- atraumatic            Eyes- Gross vision intact, PERRLA, conjunctivae clear secretions            Ears- Hearing, canals- look normal            Nose- Clear, No- Septal dev, mucus, polyps, erosion, perforation             Throat- Mallampati III , mucosa clear , drainage- none, tonsils- atrophic Neck- flexible , trachea midline, no stridor , thyroid nl, carotid no bruit Chest - symmetrical excursion , unlabored           Heart/CV- RRR , no murmur , no gallop  ,  no rub, nl s1 s2                           - JVD- none , edema- none, stasis changes- none, varices- none           Lung- wheeze in the upper lung zones, cough- none , dullness-none, rub- none           Chest wall-  Abd- tender-no, distended-no, bowel sounds-present, HSM- no Br/ Gen/ Rectal- Not done, not indicated Extrem- cyanosis- none, clubbing, none, atrophy- none, strength- nl Neuro- grossly intact to observation

## 2011-04-24 NOTE — Patient Instructions (Signed)
Neb Brovana or Perforomist     With 3-4 samples to try at home every 8-12 hours as needed. Try this when you need longer acting than your albuterol  Depo 80  Fluids and Mucinex will help make it easier to cough clear  Keep pending appointment next week - you can reschedule as appropriate

## 2011-04-26 NOTE — Assessment & Plan Note (Signed)
Mild exacerbation of asthma associated with viral or infection

## 2011-04-26 NOTE — Assessment & Plan Note (Signed)
Viral pattern URI with asthmatic bronchitis. We discussed saline rinse and decongestants with Mucinex.

## 2011-04-30 ENCOUNTER — Telehealth: Payer: Self-pay | Admitting: Internal Medicine

## 2011-04-30 NOTE — Telephone Encounter (Signed)
Per patient instructions at last ov on 04/24/11.... State "Keep pending appointment next week - you can reschedule as appropriate"  Called and spoke with pt and informed her of this information.  Pt requested to cancel appt for tomorrow, stating she is feeling much better.    Pt also requested to schedule an appt to get the flu vac.  Transferred her to [place] to schedule this on the shot schedule.

## 2011-05-01 ENCOUNTER — Ambulatory Visit: Payer: BC Managed Care – PPO | Admitting: Internal Medicine

## 2011-05-05 ENCOUNTER — Ambulatory Visit (INDEPENDENT_AMBULATORY_CARE_PROVIDER_SITE_OTHER): Payer: BC Managed Care – PPO

## 2011-05-05 DIAGNOSIS — Z23 Encounter for immunization: Secondary | ICD-10-CM

## 2011-05-06 DIAGNOSIS — Z23 Encounter for immunization: Secondary | ICD-10-CM

## 2011-06-21 ENCOUNTER — Emergency Department (HOSPITAL_COMMUNITY): Payer: BC Managed Care – PPO

## 2011-06-21 ENCOUNTER — Other Ambulatory Visit: Payer: Self-pay

## 2011-06-21 ENCOUNTER — Encounter (HOSPITAL_COMMUNITY): Payer: Self-pay | Admitting: *Deleted

## 2011-06-21 ENCOUNTER — Inpatient Hospital Stay (HOSPITAL_COMMUNITY)
Admission: EM | Admit: 2011-06-21 | Discharge: 2011-06-24 | DRG: 096 | Disposition: A | Discharge status: Home / Self Care | Payer: BC Managed Care – PPO | Attending: Internal Medicine | Admitting: Internal Medicine

## 2011-06-21 DIAGNOSIS — J309 Allergic rhinitis, unspecified: Secondary | ICD-10-CM | POA: Diagnosis present

## 2011-06-21 DIAGNOSIS — I10 Essential (primary) hypertension: Secondary | ICD-10-CM | POA: Diagnosis present

## 2011-06-21 DIAGNOSIS — J45909 Unspecified asthma, uncomplicated: Secondary | ICD-10-CM

## 2011-06-21 DIAGNOSIS — J069 Acute upper respiratory infection, unspecified: Secondary | ICD-10-CM | POA: Diagnosis present

## 2011-06-21 DIAGNOSIS — E876 Hypokalemia: Secondary | ICD-10-CM | POA: Diagnosis present

## 2011-06-21 DIAGNOSIS — J45901 Unspecified asthma with (acute) exacerbation: Principal | ICD-10-CM | POA: Diagnosis present

## 2011-06-21 LAB — TROPONIN I: Troponin I: <0.3 ng/mL (ref <0.30)

## 2011-06-21 LAB — POCT I-STAT, CHEM 8
BUN: <3 mg/dL — ABNORMAL LOW (ref 6–23)
Calcium, Ion: 1.11 mmol/L — ABNORMAL LOW (ref 1.12–1.32)
Chloride: 102 mEq/L (ref 96–112)
Creatinine, Ser: 0.8 mg/dL (ref 0.50–1.10)
Glucose, Bld: 114 mg/dL — ABNORMAL HIGH (ref 70–99)
HCT: 42 % (ref 36.0–46.0)
Hemoglobin: 14.3 g/dL (ref 12.0–15.0)
Potassium: 3.3 mEq/L — ABNORMAL LOW (ref 3.5–5.1)
Sodium: 142 mEq/L (ref 135–145)
TCO2: 28 mmol/L (ref 0–100)

## 2011-06-21 LAB — CBC
HCT: 40.2 % (ref 36.0–46.0)
Hemoglobin: 13.2 g/dL (ref 12.0–15.0)
MCH: 30.2 pg (ref 26.0–34.0)
MCHC: 32.8 g/dL (ref 30.0–36.0)
MCV: 92 fL (ref 78.0–100.0)
Platelets: 276 10*3/uL (ref 150–400)
RBC: 4.37 MIL/uL (ref 3.87–5.11)
RDW: 13.4 % (ref 11.5–15.5)
WBC: 9.1 10*3/uL (ref 4.0–10.5)

## 2011-06-21 LAB — DIFFERENTIAL
Basophils Absolute: 0 10*3/uL (ref 0.0–0.1)
Basophils Relative: 0 % (ref 0–1)
Eosinophils Absolute: 0.3 10*3/uL (ref 0.0–0.7)
Eosinophils Relative: 4 % (ref 0–5)
Lymphocytes Relative: 13 % (ref 12–46)
Lymphs Abs: 1.1 10*3/uL (ref 0.7–4.0)
Monocytes Absolute: 0.9 10*3/uL (ref 0.1–1.0)
Monocytes Relative: 10 % (ref 3–12)
Neutro Abs: 6.7 10*3/uL (ref 1.7–7.7)
Neutrophils Relative %: 74 % (ref 43–77)

## 2011-06-21 MED ORDER — METHYLPREDNISOLONE SODIUM SUCC 125 MG IJ SOLR
80.0000 mg | Freq: Two times a day (BID) | INTRAMUSCULAR | Status: DC
  Administered 2011-06-21: 80 mg via INTRAVENOUS
  Filled 2011-06-21 (×3): qty 2

## 2011-06-21 MED ORDER — METHYLPREDNISOLONE SODIUM SUCC 125 MG IJ SOLR
125.0000 mg | Freq: Once | INTRAMUSCULAR | Status: AC
  Administered 2011-06-21: 125 mg via INTRAVENOUS
  Filled 2011-06-21: qty 2

## 2011-06-21 MED ORDER — LEVALBUTEROL HCL 0.63 MG/3ML IN NEBU
0.6300 mg | INHALATION_SOLUTION | Freq: Four times a day (QID) | RESPIRATORY_TRACT | Status: DC
  Administered 2011-06-21 – 2011-06-24 (×10): 0.63 mg via RESPIRATORY_TRACT
  Filled 2011-06-21 (×15): qty 3

## 2011-06-21 MED ORDER — OXYCODONE HCL 5 MG PO TABS: 5.0000 mg | ORAL_TABLET | ORAL | Status: DC | PRN

## 2011-06-21 MED ORDER — SODIUM CHLORIDE 0.9 % IV SOLN
INTRAVENOUS | Status: DC
  Administered 2011-06-21 – 2011-06-23 (×2): via INTRAVENOUS

## 2011-06-21 MED ORDER — POLYETHYLENE GLYCOL 3350 17 G PO PACK
17.0000 g | PACK | Freq: Every day | ORAL | Status: DC | PRN
  Filled 2011-06-21: qty 1

## 2011-06-21 MED ORDER — SODIUM CHLORIDE 0.9 % IV BOLUS (SEPSIS)
500.0000 mL | Freq: Once | INTRAVENOUS | Status: AC
  Administered 2011-06-21: 500 mL via INTRAVENOUS

## 2011-06-21 MED ORDER — VITAMINS A & D EX OINT
TOPICAL_OINTMENT | CUTANEOUS | Status: AC
  Administered 2011-06-21: 22:00:00
  Filled 2011-06-21: qty 5

## 2011-06-21 MED ORDER — ALUM & MAG HYDROXIDE-SIMETH 200-200-20 MG/5ML PO SUSP: 30.0000 mL | Freq: Four times a day (QID) | ORAL | Status: DC | PRN

## 2011-06-21 MED ORDER — ALPRAZOLAM 0.25 MG PO TABS: 0.2500 mg | ORAL_TABLET | Freq: Three times a day (TID) | ORAL | Status: DC | PRN

## 2011-06-21 MED ORDER — GUAIFENESIN-DM 100-10 MG/5ML PO SYRP
5.0000 mL | ORAL_SOLUTION | ORAL | Status: DC | PRN
  Administered 2011-06-22 – 2011-06-23 (×3): 5 mL via ORAL
  Filled 2011-06-21: qty 10
  Filled 2011-06-21: qty 20
  Filled 2011-06-21: qty 10

## 2011-06-21 MED ORDER — LORATADINE 10 MG PO TABS
10.0000 mg | ORAL_TABLET | Freq: Every day | ORAL | Status: DC
  Administered 2011-06-21 – 2011-06-24 (×4): 10 mg via ORAL
  Filled 2011-06-21 (×4): qty 1

## 2011-06-21 MED ORDER — MORPHINE SULFATE 2 MG/ML IJ SOLN: 2.0000 mg | INTRAMUSCULAR | Status: DC | PRN

## 2011-06-21 MED ORDER — ONDANSETRON HCL 4 MG/2ML IJ SOLN: 4.0000 mg | Freq: Four times a day (QID) | INTRAMUSCULAR | Status: DC | PRN

## 2011-06-21 MED ORDER — METHYLPREDNISOLONE SODIUM SUCC 125 MG IJ SOLR
80.0000 mg | Freq: Two times a day (BID) | INTRAMUSCULAR | Status: DC
  Administered 2011-06-22: 80 mg via INTRAVENOUS
  Filled 2011-06-21 (×3): qty 1.28

## 2011-06-21 MED ORDER — POTASSIUM CHLORIDE CRYS ER 20 MEQ PO TBCR
40.0000 meq | EXTENDED_RELEASE_TABLET | Freq: Once | ORAL | Status: AC
  Administered 2011-06-21: 40 meq via ORAL
  Filled 2011-06-21: qty 2

## 2011-06-21 MED ORDER — LEVALBUTEROL HCL 0.63 MG/3ML IN NEBU
0.6300 mg | INHALATION_SOLUTION | RESPIRATORY_TRACT | Status: DC | PRN
  Administered 2011-06-22 (×2): 0.63 mg via RESPIRATORY_TRACT
  Filled 2011-06-21: qty 3

## 2011-06-21 MED ORDER — FLUTICASONE PROPIONATE 50 MCG/ACT NA SUSP
1.0000 | Freq: Every day | NASAL | Status: DC
  Administered 2011-06-21 – 2011-06-24 (×4): 1 via NASAL
  Filled 2011-06-21: qty 16

## 2011-06-21 MED ORDER — ALBUTEROL SULFATE (5 MG/ML) 0.5% IN NEBU
5.0000 mg | INHALATION_SOLUTION | Freq: Once | RESPIRATORY_TRACT | Status: AC
  Administered 2011-06-21: 5 mg via RESPIRATORY_TRACT
  Filled 2011-06-21: qty 1

## 2011-06-21 MED ORDER — ALBUTEROL (5 MG/ML) CONTINUOUS INHALATION SOLN
10.0000 mg/h | INHALATION_SOLUTION | Freq: Once | RESPIRATORY_TRACT | Status: AC
  Administered 2011-06-21: 10 mg/h via RESPIRATORY_TRACT

## 2011-06-21 MED ORDER — ZOLPIDEM TARTRATE 5 MG PO TABS
5.0000 mg | ORAL_TABLET | Freq: Every evening | ORAL | Status: DC | PRN
  Administered 2011-06-22 – 2011-06-23 (×2): 5 mg via ORAL
  Filled 2011-06-21 (×2): qty 1

## 2011-06-21 MED ORDER — ONDANSETRON HCL 4 MG PO TABS: 4.0000 mg | ORAL_TABLET | Freq: Four times a day (QID) | ORAL | Status: DC | PRN

## 2011-06-21 MED ORDER — ACETAMINOPHEN 325 MG PO TABS
650.0000 mg | ORAL_TABLET | Freq: Four times a day (QID) | ORAL | Status: DC | PRN
  Administered 2011-06-22: 650 mg via ORAL
  Filled 2011-06-21: qty 2

## 2011-06-21 MED ORDER — ENOXAPARIN SODIUM 40 MG/0.4ML ~~LOC~~ SOLN
40.0000 mg | SUBCUTANEOUS | Status: DC
  Administered 2011-06-21 – 2011-06-23 (×3): 40 mg via SUBCUTANEOUS
  Filled 2011-06-21 (×4): qty 0.4

## 2011-06-21 MED ORDER — ACETAMINOPHEN 650 MG RE SUPP: 650.0000 mg | Freq: Four times a day (QID) | RECTAL | Status: DC | PRN

## 2011-06-21 MED ORDER — IPRATROPIUM BROMIDE 0.02 % IN SOLN
0.5000 mg | Freq: Four times a day (QID) | RESPIRATORY_TRACT | Status: DC
  Administered 2011-06-21 – 2011-06-24 (×12): 0.5 mg via RESPIRATORY_TRACT
  Filled 2011-06-21 (×12): qty 2.5

## 2011-06-21 MED ORDER — SODIUM CHLORIDE 0.9 % IV SOLN
INTRAVENOUS | Status: DC
  Administered 2011-06-21: 125 mL via INTRAVENOUS

## 2011-06-21 MED ORDER — METHYLPREDNISOLONE SODIUM SUCC 125 MG IJ SOLR: 80.0000 mg | Freq: Two times a day (BID) | INTRAMUSCULAR | Status: DC

## 2011-06-21 MED ORDER — ALBUTEROL SULFATE (5 MG/ML) 0.5% IN NEBU
INHALATION_SOLUTION | RESPIRATORY_TRACT | Status: AC
  Filled 2011-06-21: qty 2

## 2011-06-21 NOTE — ED Notes (Signed)
Patient is resting comfortably. Gave pt a nasal cannula and put her on 2.00 L/min. Pt stated that "it was hard to breath".

## 2011-06-21 NOTE — ED Provider Notes (Signed)
History     CSN: [id number] Arrival date & time: 06/21/2011  9:08 AM   First MD Initiated Contact with Patient 06/21/11 [phone]      Chief Complaint  Patient presents with  . Shortness of Breath    (Consider location/radiation/quality/duration/timing/severity/associated sxs/prior treatment) Patient is a 40 y.o. female presenting with shortness of breath. The history is provided by the patient.  Shortness of Breath  Associated symptoms include cough, shortness of breath and wheezing. Pertinent negatives include no chest pain.  patient's had shortness of breath since Thursday. Also some headaches cough nausea vomiting. She's had no relief with her nebulizer at home. She is moderately short of breath. Some mild chest pain. She has a history of asthma, she's previously had to be admitted.she's also previously had to be intubated for her asthma. She does not smoke. She states her daughters recently had a cold. She's not had diarrhea.  Past Medical History  Diagnosis Date  . Asthma   . Allergic rhinitis   . Hypertension     Past Surgical History  Procedure Date  . Cesarean section   . Mouth surgery     Family History  Problem Relation Age of Onset  . Asthma Mother   . Coronary artery disease Maternal Grandmother   . Clotting disorder Maternal Grandmother     History  Substance Use Topics  . Smoking status: Never Smoker   . Smokeless tobacco: Never Used  . Alcohol Use: No    OB History    Grav Para Term Preterm Abortions TAB SAB Ect Mult Living                  Review of Systems  Constitutional: Positive for fatigue. Negative for activity change and appetite change.  HENT: Negative for neck stiffness.   Eyes: Negative for pain.  Respiratory: Positive for cough, shortness of breath and wheezing. Negative for chest tightness.   Cardiovascular: Negative for chest pain and leg swelling.  Gastrointestinal: Positive for nausea and vomiting. Negative for abdominal pain and  diarrhea.  Genitourinary: Negative for flank pain.  Musculoskeletal: Positive for myalgias. Negative for back pain.  Skin: Negative for rash.  Neurological: Negative for weakness, numbness and headaches.  Psychiatric/Behavioral: Negative for behavioral problems.    Allergies  Latex  Home Medications   Current Outpatient Rx  Name Route Sig Dispense Refill  . ACETAMINOPHEN 160 MG/5ML PO SOLN Oral Take 500 mg by mouth every 4 (four) hours as needed.      . ALBUTEROL SULFATE (2.5 MG/3ML) 0.083% IN NEBU Nebulization Take 2.5 mg by nebulization every 6 (six) hours as needed.      . BECLOMETHASONE DIPROPIONATE 80 MCG/ACT IN AERS Inhalation Inhale 2 puffs into the lungs 2 (two) times daily.      . BUDESONIDE-FORMOTEROL FUMARATE 160-4.5 MCG/ACT IN AERO Inhalation Inhale 2 puffs into the lungs 2 (two) times daily.      Marland Kitchen CETIRIZINE HCL 10 MG PO TABS Oral Take 10 mg by mouth daily.      Marland Kitchen LEVALBUTEROL TARTRATE 45 MCG/ACT IN AERO Inhalation Inhale 1-2 puffs into the lungs as needed.      . MOMETASONE FUROATE 50 MCG/ACT NA SUSP Nasal Place 2 sprays into the nose daily as needed.     Marland Kitchen MONTELUKAST SODIUM 10 MG PO TABS Oral Take 10 mg by mouth at bedtime.      Marland Kitchen EPIPEN 2-PAK 0.3 MG/0.3ML IJ DEVI  As directed      BP 135/61  Pulse 121  Temp(Src) 97.4 F (36.3 C) (Oral)  Resp 28  SpO2 98%  Physical Exam  Nursing note and vitals reviewed. Constitutional: She is oriented to person, place, and time. She appears well-developed and well-nourished.  HENT:  Head: Normocephalic and atraumatic.  Eyes: EOM are normal. Pupils are equal, round, and reactive to light.  Neck: Normal range of motion. Neck supple.  Cardiovascular: Regular rhythm and normal heart sounds.   No murmur heard.      tachypnea  Pulmonary/Chest: She is in respiratory distress. She has wheezes. She has no rales.       Decreased air movement throughout with some wheezes. Patient is having difficulty with full sentences. She is  speaking in a quiet voice.  Abdominal: Soft. Bowel sounds are normal. She exhibits no distension. There is no tenderness. There is no rebound and no guarding.  Musculoskeletal: Normal range of motion.  Neurological: She is alert and oriented to person, place, and time. No cranial nerve deficit.  Skin: Skin is warm and dry.  Psychiatric: She has a normal mood and affect. Her speech is normal.    ED Course  Procedures (including critical care time)  Labs Reviewed  POCT I-STAT, CHEM 8 - Abnormal; Notable for the following:    Potassium 3.3 (*)    BUN <3 (*)    Glucose, Bld 114 (*)    Calcium, Ion 1.11 (*)    All other components within normal limits  CBC  DIFFERENTIAL  TROPONIN I  I-STAT, CHEM 8   Dg Chest Port 1 View  06/21/2011  *RADIOLOGY REPORT*  Clinical Data: Shortness of breath  PORTABLE CHEST - 1 VIEW  Comparison: 04/17/2009  Findings: The heart size and mediastinal contours are within normal limits.  Both lungs are clear.  The visualized skeletal structures are unremarkable.  IMPRESSION: Negative exam.  Original Report Authenticated By: Rosealee Albee, M.D.     1. ASTHMA     Date: 06/21/2011  Rate: 109  Rhythm: sinus tachycardia  QRS Axis: normal  Intervals: normal  ST/T Wave abnormalities: nonspecific T wave changes  Conduction Disutrbances:none  Narrative Interpretation:   Old EKG Reviewed: changes notedis as one teens and low 50s is getting better all the eye for and I will let work and basically had out now he had a low that he did not smoke that is      MDM  Shortness of breath with history of asthma. Patient continues to be tachycardic. Having trouble breathing. She's had an hour long treatment here and additional 5 mg nebulizer treatment. X-ray does not show pneumonia. She's previous history of admission. She'll be admitted to [facility].        Juliet Rude. Rubin Payor, MD 06/21/11 [phone]

## 2011-06-21 NOTE — ED Notes (Signed)
Pt given cup of water to drink. 

## 2011-06-21 NOTE — H&P (Signed)
PCP:   Hollice Espy, MD   Chief Complaint:  Shortness of breath  HPI: Patient is a 40 year old Afro-American female past medical history of asthma who 13 prior occasions has been admitted for flare up that required intubation who comes in complaining of wheezing and shortness of breath and cough progressively worsening over the last 2 days. Patient has been relatively well.  Her last admission was back in the summer when she had a URI which triggered off an asthma attack. The last 2 days she has started complaining of wheezing and nonproductive cough and progressive dyspnea which did not improve and she came to the emergency room today. Lab work she was noted to have a normal white count without shift, stable oxygen saturations but mildly tachycardic and otherwise normal vitals. Patient received doses of nebulizers and Solu-Medrol still continued to have some wheezing looked to be in mild distress. Decision was made for patient to be admitted.   Review of Systems:  When I saw the patient, she was still feeling very rough. She denies any headaches, vision changes, dysphasia, chest pain. She did complain of palpitations, shortness of breath, wheezing and since she came to the emergency room, was complaining of cough productive cough with yellowish sputum. She denied any abdominal pain, hematuria, dysuria, constipation, diarrhea, focal tenderness weakness or pain. She denies any nausea vomiting but did complain of generalized weakness. She complained of mild bilateral chest soreness after coughing. Review systems otherwise negative.  Past Medical History: Past Medical History  Diagnosis Date  . Asthma   . Allergic rhinitis   . Hypertension    Past Surgical History  Procedure Date  . Cesarean section   . Mouth surgery     Medications: Prior to Admission medications   Medication Sig Start Date End Date Taking? Authorizing Provider  acetaminophen (TYLENOL) 160 MG/5ML solution Take 500 mg  by mouth every 4 (four) hours as needed.     Yes Historical Provider, MD  albuterol (PROVENTIL) (2.5 MG/3ML) 0.083% nebulizer solution Take 2.5 mg by nebulization every 6 (six) hours as needed.     Yes Historical Provider, MD  beclomethasone (QVAR) 80 MCG/ACT inhaler Inhale 2 puffs into the lungs 2 (two) times daily.     Yes Historical Provider, MD  budesonide-formoterol (SYMBICORT) 160-4.5 MCG/ACT inhaler Inhale 2 puffs into the lungs 2 (two) times daily.     Yes Historical Provider, MD  cetirizine (ZYRTEC) 10 MG tablet Take 10 mg by mouth daily.     Yes Historical Provider, MD  levalbuterol [facility] HFA) 45 MCG/ACT inhaler Inhale 1-2 puffs into the lungs as needed.     Yes Historical Provider, MD  mometasone (NASONEX) 50 MCG/ACT nasal spray Place 2 sprays into the nose daily as needed.    Yes Historical Provider, MD  montelukast (SINGULAIR) 10 MG tablet Take 10 mg by mouth at bedtime.     Yes Historical Provider, MD  EPIPEN 2-PAK 0.3 MG/0.3ML DEVI As directed    Historical Provider, MD    Allergies:   Allergies  Allergen Reactions  . Latex Rash    Social History:  reports that she has never smoked. She has never used smokeless tobacco. She reports that she does not drink alcohol or use illicit drugs. the patient lives at home with her will husband and daughter. She is normally quite independent and active.  Family History: Family History  Problem Relation Age of Onset  . Asthma Mother   . Coronary artery disease Maternal Grandmother   .  Clotting disorder Maternal Grandmother     Physical Exam: Filed Vitals:   06/21/11 0910 06/21/11 1138 06/21/11 1202  BP: 157/87 178/94   Pulse: 115 106   Temp: 99.7 F (37.6 C) 98.7 F (37.1 C)   TempSrc: Oral Oral   Resp: 22 14   SpO2: 97% 100% 100%   General: Alert and oriented x3, mild distress secondary to respiratory. HEENT: Normocephalic atraumatic, her mucous membranes are slightly dry Cardiovascular: Regular rhythm, mild  tachycardia Lungs: Bilateral wheezes with some scattered rhonchi. Abdomen: Soft, nontender, obese, normoactive bowel sounds Extremities: No clubbing cyanosis or edema.   Labs on Admission:   [facility] 06/21/11 0940  NA 142  K 3.3*  CL 102  CO2 --  GLUCOSE 114*  BUN <3*  CREATININE 0.80  CALCIUM --  MG --  PHOS --    Basename 06/21/11 0940 06/21/11 0930  WBC -- 9.1  NEUTROABS -- 6.7  HGB 14.3 13.2  HCT 42.0 40.2  MCV -- 92.0  PLT -- 276    Basename 06/21/11 0953  CKTOTAL --  CKMB --  CKMBINDEX --  TROPONINI <0.30    Radiological Exams on Admission: Dg Chest Port 1 View 06/21/2011  IMPRESSION: Negative exam.  .    Assessment/Plan Present on Admission:  .ALLERGIC RHINITIS: Continue antihistamine.  .ASTHMA UNSPECIFIED WITH EXACERBATION: Set off by URI. No evidence of any acute bacterial infection such as pneumonia or bronchitis. Will watch for fever. Treat with nebulizers plus oxygen plus IV steroids. Monitor closely, as patient has had previous intubations in the past. Continuous pulse ox.  Marland KitchenURI (upper respiratory infection): Treat symptomatically  Hypokalemia: Replace  .Obesities, morbid  As discussed with the patient, she is a full code. Anticipate discharge to home in 2 days with rapid turnaround, or more likely 3 days.  Sylvia [name] K 06/21/2011, 1:58 PM

## 2011-06-21 NOTE — ED Notes (Signed)
Shortness of breath, nausea and vomiting since Thursday

## 2011-06-22 LAB — BASIC METABOLIC PANEL
BUN: 9 mg/dL (ref 6–23)
CO2: 27 mEq/L (ref 19–32)
Calcium: 9.6 mg/dL (ref 8.4–10.5)
Chloride: 105 mEq/L (ref 96–112)
Creatinine, Ser: 0.6 mg/dL (ref 0.50–1.10)
GFR calc Af Amer: >90 mL/min (ref >90)
GFR calc non Af Amer: >90 mL/min (ref >90)
Glucose, Bld: 131 mg/dL — ABNORMAL HIGH (ref 70–99)
Potassium: 3.6 mEq/L (ref 3.5–5.1)
Sodium: 140 mEq/L (ref 135–145)

## 2011-06-22 MED ORDER — METHYLPREDNISOLONE SODIUM SUCC 125 MG IJ SOLR
80.0000 mg | Freq: Three times a day (TID) | INTRAMUSCULAR | Status: DC
  Administered 2011-06-22 – 2011-06-23 (×4): 80 mg via INTRAVENOUS
  Filled 2011-06-22 (×9): qty 1.28

## 2011-06-22 NOTE — Progress Notes (Signed)
Pt complains of sob/with chest tightness and sometimes pc with yellowish sputum. Nebulizer tx alleviate some, encouraged pt deep breathe and cough.

## 2011-06-22 NOTE — Progress Notes (Addendum)
Subjective: Patient seen and examined, complaining of shortness of breath.  Objective: Vital signs in last 24 hours: Temp:  [97.4 F (36.3 C)-100.1 F (37.8 C)] 97.7 F (36.5 C) (12/16 0515) Pulse Rate:  [81-124] 81  (12/16 0515) Resp:  [14-28] 17  (12/16 0515) BP: (135-178)/(61-98) 150/80 mmHg (12/16 0515) SpO2:  [96 %-100 %] 97 % (12/16 0935) Weight:  [108.863 kg (240 lb)] 240 lb (108.863 kg) (12/15 1833) Weight change:  Last BM Date: 06/19/11  Intake/Output from previous day: 12/15 0701 - 12/16 0700 In: 258 [I.V.:258] Out: -      Physical Exam: General: Alert, awake, oriented x3, in no acute distress. HEENT: No bruits, no goiter. Heart: Regular rate and rhythm, without murmurs, rubs, gallops. Lungs: Diffuse expiratory wheezes. Abdomen: Soft, nontender, nondistended, positive bowel sounds. Extremities: No clubbing cyanosis or edema with positive pedal pulses. Neuro: Grossly intact, nonfocal.    Lab Results: Results for orders placed during the hospital encounter of 06/21/11 (from the past 24 hour(s))  BASIC METABOLIC PANEL     Status: Abnormal   Collection Time   06/22/11  4:06 AM      Component Value Range   Sodium 140  135 - 145 (mEq/L)   Potassium 3.6  3.5 - 5.1 (mEq/L)   Chloride 105  96 - 112 (mEq/L)   CO2 27  19 - 32 (mEq/L)   Glucose, Bld 131 (*) 70 - 99 (mg/dL)   BUN 9  6 - 23 (mg/dL)   Creatinine, Ser 4.13  0.50 - 1.10 (mg/dL)   Calcium 9.6  8.4 - 24.4 (mg/dL)   GFR calc non Af Amer >90  >90 (mL/min)   GFR calc Af Amer >90  >90 (mL/min)    Studies/Results: Dg Chest Port 1 View  06/21/2011  *RADIOLOGY REPORT*  Clinical Data: Shortness of breath  PORTABLE CHEST - 1 VIEW  Comparison: 04/17/2009  Findings: The heart size and mediastinal contours are within normal limits.  Both lungs are clear.  The visualized skeletal structures are unremarkable.  IMPRESSION: Negative exam.  Original Report Authenticated By: Rosealee Albee, M.D.    Medications:     . albuterol  5 mg Nebulization Once  . albuterol  10 mg/hr Nebulization Once  . enoxaparin  40 mg Subcutaneous Q24H  . fluticasone  1 spray Each Nare Daily  . ipratropium  0.5 mg Nebulization Q6H  . levalbuterol  0.63 mg Nebulization Q6H  . loratadine  10 mg Oral Daily  . methylPREDNISolone (SOLU-MEDROL) injection  125 mg Intravenous Once  . methylPREDNISolone (SOLU-MEDROL) injection  80 mg Intravenous Q12H  . potassium chloride  40 mEq Oral Once  . sodium chloride  500 mL Intravenous Once  . vitamin A & D      . DISCONTD: methylPREDNISolone (SOLU-MEDROL) injection  80 mg Intravenous Q12H  . DISCONTD: methylPREDNISolone (SOLU-MEDROL) injection  80 mg Intravenous Q12H    acetaminophen, acetaminophen, ALPRAZolam, alum & mag hydroxide-simeth, guaiFENesin-dextromethorphan, levalbuterol, morphine, ondansetron (ZOFRAN) IV, ondansetron, oxyCODONE, polyethylene glycol, zolpidem     . sodium chloride 125 mL (06/21/11 1002)  . sodium chloride 30 mL/hr at 06/21/11 1754    Assessment/Plan:  Active Problems:  ALLERGIC RHINITIS  ASTHMA UNSPECIFIED WITH EXACERBATION  URI (upper respiratory infection)  Obesities, morbid Plan Continue nebs, I would increase Solu Medrol dose to 80 mg IV every 8 hours.  She had URI symptoms on Thrusday .I will send influenza PCR panel, I will hold off Tamiflu since her symptoms started more than 48  hours ago . Chest x-ray is unremarkable.   LOS: 1 day   Raiford Fetterman 06/22/2011, 9:55 AM

## 2011-06-23 LAB — URINALYSIS, ROUTINE W REFLEX MICROSCOPIC
Bilirubin Urine: NEGATIVE
Glucose, UA: NEGATIVE mg/dL
Hgb urine dipstick: NEGATIVE
Ketones, ur: NEGATIVE mg/dL
Leukocytes, UA: NEGATIVE
Nitrite: NEGATIVE
Protein, ur: NEGATIVE mg/dL
Specific Gravity, Urine: 1.027 (ref 1.005–1.030)
Urobilinogen, UA: 1 mg/dL (ref 0.0–1.0)
pH: 6.5 (ref 5.0–8.0)

## 2011-06-23 LAB — INFLUENZA PANEL BY PCR (TYPE A & B)
H1N1 flu by pcr: NOT DETECTED
Influenza A By PCR: NEGATIVE
Influenza B By PCR: NEGATIVE

## 2011-06-23 MED ORDER — METHYLPREDNISOLONE SODIUM SUCC 125 MG IJ SOLR
80.0000 mg | Freq: Two times a day (BID) | INTRAMUSCULAR | Status: DC
  Administered 2011-06-24: 80 mg via INTRAVENOUS
  Filled 2011-06-23 (×3): qty 1.28

## 2011-06-23 NOTE — Progress Notes (Signed)
UR completed 

## 2011-06-23 NOTE — Progress Notes (Signed)
Subjective:  Patient seen and examined, feeling better today. Complaining of mild dysuria.  Objective: Vital signs in last 24 hours: Temp:  [98.4 F (36.9 C)-99.5 F (37.5 C)] 99.5 F (37.5 C) (12/17 1306) Pulse Rate:  [84-108] 84  (12/17 1306) Resp:  [19-20] 20  (12/17 1306) BP: (147-165)/(78-89) 165/89 mmHg (12/17 1306) SpO2:  [94 %-97 %] 96 % (12/17 1457) Weight change:  Last BM Date: 06/22/11  Intake/Output from previous day: 12/16 0701 - 12/17 0700 In: 1506 [P.O.:1270; I.V.:236] Out: -  Total I/O In: 240 [P.O.:240] Out: 150 [Urine:150]   Physical Exam: General: Alert, awake, oriented x3, in no acute distress. HEENT: No bruits, no goiter. Heart: Regular rate and rhythm, without murmurs, rubs, gallops. Lungs: Scattered expiratory wheezes. Abdomen: Soft, nontender, nondistended, positive bowel sounds. Extremities: No clubbing cyanosis or edema with positive pedal pulses. Neuro: Grossly intact, nonfocal.    Lab Results: Results for orders placed during the hospital encounter of 06/21/11 (from the past 24 hour(s))  URINALYSIS, ROUTINE W REFLEX MICROSCOPIC     Status: Abnormal   Collection Time   06/23/11 11:20 AM      Component Value Range   Color, Urine YELLOW  YELLOW    APPearance CLOUDY (*) CLEAR    Specific Gravity, Urine 1.027  1.005 - 1.030    pH 6.5  5.0 - 8.0    Glucose, UA NEGATIVE  NEGATIVE (mg/dL)   Hgb urine dipstick NEGATIVE  NEGATIVE    Bilirubin Urine NEGATIVE  NEGATIVE    Ketones, ur NEGATIVE  NEGATIVE (mg/dL)   Protein, ur NEGATIVE  NEGATIVE (mg/dL)   Urobilinogen, UA 1.0  0.0 - 1.0 (mg/dL)   Nitrite NEGATIVE  NEGATIVE    Leukocytes, UA NEGATIVE  NEGATIVE     Studies/Results: No results found.  Medications:    . enoxaparin  40 mg Subcutaneous Q24H  . fluticasone  1 spray Each Nare Daily  . ipratropium  0.5 mg Nebulization Q6H  . levalbuterol  0.63 mg Nebulization Q6H  . loratadine  10 mg Oral Daily  . methylPREDNISolone  (SOLU-MEDROL) injection  80 mg Intravenous Q8H    acetaminophen, acetaminophen, ALPRAZolam, alum & mag hydroxide-simeth, guaiFENesin-dextromethorphan, levalbuterol, morphine, ondansetron (ZOFRAN) IV, ondansetron, oxyCODONE, polyethylene glycol, zolpidem     . sodium chloride 125 mL (06/21/11 1002)  . sodium chloride 20 mL/hr at 06/23/11 0115    Assessment/Plan:  ALLERGIC RHINITIS  ASTHMA UNSPECIFIED WITH EXACERBATION  URI (upper respiratory infection)  Obesities, morbid  Plan  Improving Continue nebs, taper Solu Medrol dose to 80 mg IV every 12 hours. Will switch to by mouth prednisone tomorrow Continue to monitor to assess in a.m. Check UA       LOS: 2 days   Bradd Merlos 06/23/2011, 4:58 PM

## 2011-06-24 LAB — URINE CULTURE
Colony Count: >=100000
Culture  Setup Time: 201212171646

## 2011-06-24 MED ORDER — PREDNISONE (PAK) 10 MG PO TABS: 10.0000 mg | ORAL_TABLET | Freq: Every day | ORAL | Status: AC

## 2011-06-24 NOTE — Progress Notes (Signed)
D/C home, her husband picked her up.

## 2011-06-24 NOTE — Discharge Summary (Signed)
Patient ID: Sylvia [name] MRN: [id number] DOB/AGE: March 29, 1971 40 y.o.  Admit date: 06/21/2011 Discharge date: 06/24/2011  Primary Care Physician:  Hollice Espy, MD   Discharge Diagnoses:    Present on Admission:  .ALLERGIC RHINITIS .ASTHMA UNSPECIFIED WITH EXACERBATION .URI (upper respiratory infection) .Obesities, morbid HTN  Current Discharge Medication List    START taking these medications   Details  predniSONE (STERAPRED UNI-PAK) 10 MG tablet Take 1 tablet (10 mg total) by mouth daily. Take 60 mg (6 tabs ) daily for 3 days ,then 40 mg (4 tabs) daily for 3 days then 20 mg (2 tabs) daily for 3 days ,then 10 mg (1tab) daily for 3 days Qty: 39 tablet, Refills: 0      CONTINUE these medications which have NOT CHANGED   Details  acetaminophen (TYLENOL) 160 MG/5ML solution Take 500 mg by mouth every 4 (four) hours as needed.      albuterol (PROVENTIL) (2.5 MG/3ML) 0.083% nebulizer solution Take 2.5 mg by nebulization every 6 (six) hours as needed.      beclomethasone (QVAR) 80 MCG/ACT inhaler Inhale 2 puffs into the lungs 2 (two) times daily.      budesonide-formoterol (SYMBICORT) 160-4.5 MCG/ACT inhaler Inhale 2 puffs into the lungs 2 (two) times daily.      cetirizine (ZYRTEC) 10 MG tablet Take 10 mg by mouth daily.      levalbuterol (XOPENEX HFA) 45 MCG/ACT inhaler Inhale 1-2 puffs into the lungs as needed.      mometasone (NASONEX) 50 MCG/ACT nasal spray Place 2 sprays into the nose daily as needed.     montelukast (SINGULAIR) 10 MG tablet Take 10 mg by mouth at bedtime.      EPIPEN 2-PAK 0.3 MG/0.3ML DEVI As directed         Consults:  None    Significant Diagnostic Studies:  Dg Chest Port 1 View  06/21/2011  *RADIOLOGY REPORT*  Clinical Data: Shortness of breath  PORTABLE CHEST - 1 VIEW  Comparison: 04/17/2009  Findings: The heart size and mediastinal contours are within normal limits.  Both lungs are clear.  The visualized skeletal structures  are unremarkable.  IMPRESSION: Negative exam.  Original Report Authenticated By: Rosealee Albee, M.D.    Brief H and P: For complete details please refer to admission H and P, but in brief  Patient is a 40 year old Afro-American female past medical history of asthma who 13 prior occasions has been admitted for flare up that required intubation who comes in complaining of wheezing and shortness of breath and cough progressively worsening over the last 2 days. Patient has been relatively well. Her last admission was back in the summer when she had a URI which triggered off an asthma attack. The last 2 days she has started complaining of wheezing and nonproductive cough and progressive dyspnea which did not improve and she came to the emergency room today. Lab work she was noted to have a normal white count without shift, stable oxygen saturations but mildly tachycardic and otherwise normal vitals. Patient received doses of nebulizers and Solu-Medrol still continued to have some wheezing looked to be in mild distress. Decision was made for patient to be admitted.   Hospital Course:  Patient was treated with levalbuterol and atrovent  nebulizerand solumedrol she responded very well ,solumedrol dose was tapered down yesterday and will discharge home on prednisone taper  BP noted to be elevated .patient was not on antihypertensive meds at home,advised to monitor BP daily at home ,record  readings and follow with PCP ,I will recommend to treat if stayed above 130/90 . Patient seen and examined today,feeling much better ,not in distress ,sat 97% on RA .stable for discharge to home    Filed Vitals:   06/24/11 0637  BP: 147/82  Pulse: 65  Temp: 97.9 F (36.6 C)  Resp: 18    General: Alert, awake, oriented x3, in no acute distress. Heart: Regular rate and rhythm, without murmurs, rubs, gallops. Lungs: Clear to auscultation bilaterally. Abdomen: Soft, nontender, nondistended, positive bowel  sounds. Extremities: No clubbing cyanosis or edema with positive pedal pulses. Neuro: Grossly intact, nonfocal.   Disposition and Follow-up:  To home  Follow with PCP ASAP    Time spent on Discharge: 45 MINUTES    Signed: Malay Fantroy 06/24/2011, 10:07 AM

## 2011-06-24 NOTE — Progress Notes (Signed)
D/C instructions renderd, patient verbalized understanding, satble, no c/o pain, no c/o SOB, prescriptions given.

## 2011-06-30 ENCOUNTER — Ambulatory Visit (INDEPENDENT_AMBULATORY_CARE_PROVIDER_SITE_OTHER): Payer: BC Managed Care – PPO | Admitting: Internal Medicine

## 2011-06-30 ENCOUNTER — Encounter: Payer: Self-pay | Admitting: Allergy

## 2011-06-30 ENCOUNTER — Encounter: Payer: Self-pay | Admitting: Internal Medicine

## 2011-06-30 VITALS — BP 128/80 | HR 95 | Ht 64.0 in | Wt 258.4 lb

## 2011-06-30 DIAGNOSIS — J45901 Unspecified asthma with (acute) exacerbation: Secondary | ICD-10-CM

## 2011-06-30 MED ORDER — LEVALBUTEROL HCL 0.63 MG/3ML IN NEBU
0.6300 mg | INHALATION_SOLUTION | Freq: Once | RESPIRATORY_TRACT | Status: AC
  Administered 2011-06-30: 0.63 mg via RESPIRATORY_TRACT

## 2011-06-30 MED ORDER — METHYLPREDNISOLONE ACETATE 80 MG/ML IJ SUSP
80.0000 mg | Freq: Once | INTRAMUSCULAR | Status: AC
  Administered 2011-06-30: 80 mg via INTRAMUSCULAR

## 2011-06-30 NOTE — Progress Notes (Signed)
Patient ID: Sylvia [name], female    DOB: [birth year], 40 y.o.   MRN: [id number]  HPI 01/20/11- [name] never smoker followed for asthma, allergic rhinitis Last here September 17, 2010- note reviewed She had been doing very well. Last night home was more humid. They monitor humidity levels.  She had been wheezing and coughing some all day, but woke around 2 AM worse. Husband cranked up the dehumidifiers and she took 2 neb treatments 30 minutes apart. After that she calmed down and was able to get back to sleep. Today she is still a little tight.  Over time she has felt she was doing much better, using the combination of symbicort, Qvar and Singulair. Denies recent cold, but daughter had a sinus infection and cough.   04/24/11- [name] never smoker followed for asthma, allergic rhinitis...Marland KitchenMarland Kitchenhere w/ husband and daughter 4 days ago she came down with a cold, shared with her husband. By the next night she had cough and wheezing was using her nebulizer every 8 hours as helps her cough became more productive but makes her shaky. She feels worse lying down and better if she props up to sleep. Some pressure in the right ear and both maxillary sinuses with no purulent discharge and no sore throat.  06/30/11- [name] never smoker followed for asthma, allergic rhinitis Post hospital visit- [facility] viral bronchitis syndrome. Still tapering prednisone. Still productive cough, weak and tired. Supine feels soreness upper anterior chest wall. Did have flu vax. Was not given Tamiflu. While in hospital she had asked restricted access to her records to close she did not want calls from her employer. She feels washed out now after her recent illness as she tapers prednisone, but otherwise well.   Review of Systems Constitutional:   No-   weight loss, night sweats, fevers, chills, fatigue, lassitude. HEENT:   No-   headaches, difficulty swallowing, tooth/dental problems, sore throat,                  No-   sneezing, itching,  ear ache,   +nasal congestion, post nasal drip,  CV:  No-   chest pain, orthopnea, PND, swelling in lower extremities, anasarca, dizziness, palpitations GI:  No-   heartburn, indigestion, abdominal pain, nausea, vomiting, diarrhea,                 change in bowel habits, loss of appetite Resp: Still easy shortness of breath with exertion or at rest.  No-  excess mucus,             No-  coughing up of blood.              No-   change in color of mucus.     Skin: No-   rash or lesions. GU:  MS:  No-   joint pain or swelling.  No- decreased range of motion.  No- back pain. Psych:  No- change in mood or affect. No depression or anxiety.  No memory loss.     Objective:   Physical Exam General- Alert, Oriented, Affect-appropriate, Distress- none acute. obese Skin- rash-none, lesions- none, excoriation- none Lymphadenopathy- none Head- atraumatic            Eyes- Gross vision intact, PERRLA, conjunctivae clear secretions            Ears- Hearing, canals- look normal            Nose- Clear, No- Septal dev, mucus, polyps, erosion, perforation  Throat- Mallampati III-IV , mucosa clear , drainage- none, tonsils- atrophic Neck- flexible , trachea midline, no stridor , thyroid nl, carotid no bruit Chest - symmetrical excursion , unlabored           Heart/CV- RRR , no murmur , no gallop  , no rub, nl s1 s2                           - JVD- none , edema- none, stasis changes- none, varices- none           Lung- , unlabored and clear with quiet auscultation. She did demonstrate a wheezy cough once , dullness-none, rub- none           Chest wall-  Abd- tender-no, distended-no, bowel sounds-present, HSM- no Br/ Gen/ Rectal- Not done, not indicated Extrem- cyanosis- none, clubbing, none, atrophy- none, strength- nl Neuro- grossly intact to observation

## 2011-06-30 NOTE — Patient Instructions (Addendum)
Neb xop 0.63, depo 80   Finish prednisone taper and avoid getting exhausted with the holidays and wet weather. You should gradually get back to normal.  Have a Merry Christmas and please call as needed  Ok return to work July 14, 2011

## 2011-07-05 NOTE — Assessment & Plan Note (Signed)
She is much improved after this latest exacerbation. Plan-finish prednisone taper. She is given nebulizer treatment and Depo-Medrol today with some concern about the upcoming long weekend.

## 2011-10-23 ENCOUNTER — Ambulatory Visit: Payer: BC Managed Care – PPO | Admitting: Internal Medicine

## 2011-11-21 ENCOUNTER — Telehealth: Payer: Self-pay | Admitting: Internal Medicine

## 2011-11-21 MED ORDER — BECLOMETHASONE DIPROPIONATE 80 MCG/ACT IN AERS: 2.0000 | INHALATION_SPRAY | Freq: Two times a day (BID) | RESPIRATORY_TRACT | Status: DC

## 2011-11-21 MED ORDER — LEVALBUTEROL TARTRATE 45 MCG/ACT IN AERO: 1.0000 | INHALATION_SPRAY | RESPIRATORY_TRACT | Status: DC | PRN

## 2011-11-21 NOTE — Telephone Encounter (Signed)
Pt aware that refill for QVAR and Xopenex HFA have been sent to [facility] [address] as requested.

## 2011-12-25 ENCOUNTER — Ambulatory Visit (INDEPENDENT_AMBULATORY_CARE_PROVIDER_SITE_OTHER): Payer: BC Managed Care – PPO | Admitting: Internal Medicine

## 2011-12-25 ENCOUNTER — Encounter: Payer: Self-pay | Admitting: Internal Medicine

## 2011-12-25 VITALS — BP 150/86 | HR 81 | Ht 64.0 in | Wt 258.2 lb

## 2011-12-25 DIAGNOSIS — J309 Allergic rhinitis, unspecified: Secondary | ICD-10-CM

## 2011-12-25 DIAGNOSIS — J45901 Unspecified asthma with (acute) exacerbation: Secondary | ICD-10-CM

## 2011-12-25 NOTE — Patient Instructions (Addendum)
For asthma gradually taper off your medicines one at a time, to see if you still need all of them: You can stop the nebulizer and rescue inhaler- using them as needed  Stop the Symbicort, and see if the Qvar is sufficient as a maintenance inhaler Stop the Singulair  For your stuffy nose: Try sample Dymista nasal spray-  1 or 2 puffs each nostril, once daily at bedtime.    This would be instead of either nasonex or flonase Try otc decongestant with phenylephrine, like Sudafed-PE

## 2011-12-25 NOTE — Progress Notes (Signed)
Patient ID: Sylvia [name], female    DOB: January 09, 1971, 41 y.o.   MRN: [id number]  HPI 01/20/11- [name] never smoker followed for asthma, allergic rhinitis Last here September 17, 2010- note reviewed She had been doing very well. Last night home was more humid. They monitor humidity levels.  She had been wheezing and coughing some all day, but woke around 2 AM worse. Husband cranked up the dehumidifiers and she took 2 neb treatments 30 minutes apart. After that she calmed down and was able to get back to sleep. Today she is still a little tight.  Over time she has felt she was doing much better, using the combination of symbicort, Qvar and Singulair. Denies recent cold, but daughter had a sinus infection and cough.   04/24/11- [name] never smoker followed for asthma, allergic rhinitis...Marland KitchenMarland Kitchenhere w/ husband and daughter 4 days ago she came down with a cold, shared with her husband. By the next night she had cough and wheezing was using her nebulizer every 8 hours as helps her cough became more productive but makes her shaky. She feels worse lying down and better if she props up to sleep. Some pressure in the right ear and both maxillary sinuses with no purulent discharge and no sore throat.  06/30/11- [name] never smoker followed for asthma, allergic rhinitis Post hospital visit- [facility] viral bronchitis syndrome. Still tapering prednisone. Still productive cough, weak and tired. Supine feels soreness upper anterior chest wall. Did have flu vax. Was not given Tamiflu. While in hospital she had asked restricted access to her records to close she did not want calls from her employer. She feels washed out now after her recent illness as she tapers prednisone, but otherwise well.  12/25/11- [name] never smoker followed for asthma, allergic rhinitis Has noticed she is having stuffiness in nasal area; overall doing well with breathing. She has completely resolved the post viral bronchitis for which she was  hospitalized before our last visit. She is now doing well and feels back to normal. Some nasal congestion blamed on the weather. Still using multiple medications which we reviewed.  Review of Systems-see HPI Constitutional:   No-   weight loss, night sweats, fevers, chills, fatigue, lassitude. HEENT:   No-   headaches, difficulty swallowing, tooth/dental problems, sore throat,                  No-   sneezing, itching, ear ache,   +nasal congestion, post nasal drip,  CV:  No-   chest pain, orthopnea, PND, swelling in lower extremities, anasarca, dizziness, palpitations GI:  No-   heartburn, indigestion, abdominal pain, nausea, vomiting, diarrhea,                 change in bowel habits, loss of appetite Resp: Still easy shortness of breath with exertion or at rest.  No-  excess mucus,             No-  coughing up of blood.              No-   change in color of mucus.     Skin: No-   rash or lesions. GU:  MS:  No-   joint pain or swelling.  Marland Kitchen Psych:  No- change in mood or affect. No depression or anxiety.  No memory loss.     Objective:   Physical Exam General- Alert, Oriented, Affect-appropriate, Distress- none acute. obese Skin- rash-none, lesions- none, excoriation- none Lymphadenopathy-  none Head- atraumatic            Eyes- Gross vision intact, PERRLA, conjunctivae clear secretions            Ears- Hearing, canals- look normal            Nose- pale edema in the distal right nostril might be the surface of a polyp, No- Septal dev, mucus, polyps, erosion, perforation             Throat- Mallampati III-IV , mucosa clear , drainage- none, tonsils- atrophic Neck- flexible , trachea midline, no stridor , thyroid nl, carotid no bruit Chest - symmetrical excursion , unlabored           Heart/CV- RRR , no murmur , no gallop  , no rub, nl s1 s2                           - JVD- none , edema- none, stasis changes- none, varices- none           Lung- , unlabored and clear with quiet  auscultation. She did demonstrate a wheezy cough once , dullness-none, rub- none           Chest wall-  Abd-  Br/ Gen/ Rectal- Not done, not indicated Extrem- cyanosis- none, clubbing, none, atrophy- none, strength- nl Neuro- grossly intact to observation

## 2012-01-02 NOTE — Assessment & Plan Note (Signed)
Not well controlled. We discussed tapering off of medications as tolerated. We need to define what her maintenance requirements are. Anticipate need to document PFT

## 2012-01-02 NOTE — Assessment & Plan Note (Signed)
Plan-sample Dymista nasal spray. May also try OTC Sudafed-PE

## 2012-01-22 ENCOUNTER — Other Ambulatory Visit: Payer: Self-pay | Admitting: Internal Medicine

## 2012-02-08 ENCOUNTER — Encounter (HOSPITAL_COMMUNITY): Payer: Self-pay | Admitting: Emergency Medicine

## 2012-02-08 ENCOUNTER — Inpatient Hospital Stay (HOSPITAL_COMMUNITY)
Admission: EM | Admit: 2012-02-08 | Discharge: 2012-02-10 | DRG: 096 | Disposition: A | Discharge status: Home / Self Care | Payer: BC Managed Care – PPO | Attending: Internal Medicine | Admitting: Internal Medicine

## 2012-02-08 DIAGNOSIS — R000 Tachycardia, unspecified: Secondary | ICD-10-CM | POA: Diagnosis present

## 2012-02-08 DIAGNOSIS — R7309 Other abnormal glucose: Secondary | ICD-10-CM | POA: Diagnosis present

## 2012-02-08 DIAGNOSIS — T380X5A Adverse effect of glucocorticoids and synthetic analogues, initial encounter: Secondary | ICD-10-CM | POA: Diagnosis present

## 2012-02-08 DIAGNOSIS — E66813 Obesity, class 3: Secondary | ICD-10-CM | POA: Diagnosis present

## 2012-02-08 DIAGNOSIS — R7989 Other specified abnormal findings of blood chemistry: Secondary | ICD-10-CM | POA: Diagnosis present

## 2012-02-08 DIAGNOSIS — R05 Cough: Secondary | ICD-10-CM | POA: Diagnosis present

## 2012-02-08 DIAGNOSIS — I10 Essential (primary) hypertension: Secondary | ICD-10-CM | POA: Diagnosis present

## 2012-02-08 DIAGNOSIS — E876 Hypokalemia: Secondary | ICD-10-CM | POA: Diagnosis present

## 2012-02-08 DIAGNOSIS — R739 Hyperglycemia, unspecified: Secondary | ICD-10-CM | POA: Diagnosis present

## 2012-02-08 DIAGNOSIS — R059 Cough, unspecified: Secondary | ICD-10-CM | POA: Diagnosis present

## 2012-02-08 DIAGNOSIS — J45901 Unspecified asthma with (acute) exacerbation: Principal | ICD-10-CM | POA: Diagnosis present

## 2012-02-08 DIAGNOSIS — Z6841 Body Mass Index (BMI) 40.0 and over, adult: Secondary | ICD-10-CM

## 2012-02-08 DIAGNOSIS — E349 Endocrine disorder, unspecified: Secondary | ICD-10-CM | POA: Diagnosis present

## 2012-02-08 DIAGNOSIS — Z79899 Other long term (current) drug therapy: Secondary | ICD-10-CM

## 2012-02-08 HISTORY — DX: Adverse effect of glucocorticoids and synthetic analogues, initial encounter: T38.0X5A

## 2012-02-08 HISTORY — DX: Other specified abnormal findings of blood chemistry: R79.89

## 2012-02-08 HISTORY — DX: Hyperglycemia, unspecified: R73.9

## 2012-02-08 MED ORDER — HYDRALAZINE HCL 20 MG/ML IJ SOLN
10.0000 mg | Freq: Four times a day (QID) | INTRAMUSCULAR | Status: DC | PRN
  Filled 2012-02-08: qty 0.5

## 2012-02-08 MED ORDER — ALBUTEROL SULFATE (5 MG/ML) 0.5% IN NEBU
5.0000 mg | INHALATION_SOLUTION | Freq: Once | RESPIRATORY_TRACT | Status: AC
  Administered 2012-02-08: 5 mg via RESPIRATORY_TRACT
  Filled 2012-02-08: qty 1

## 2012-02-08 MED ORDER — IPRATROPIUM BROMIDE 0.02 % IN SOLN
0.5000 mg | Freq: Once | RESPIRATORY_TRACT | Status: AC
  Administered 2012-02-08: 0.5 mg via RESPIRATORY_TRACT
  Filled 2012-02-08: qty 2.5

## 2012-02-08 MED ORDER — AMLODIPINE BESYLATE 5 MG PO TABS
5.0000 mg | ORAL_TABLET | Freq: Every day | ORAL | Status: DC
  Administered 2012-02-08 – 2012-02-10 (×3): 5 mg via ORAL
  Filled 2012-02-08 (×3): qty 1

## 2012-02-08 MED ORDER — ALBUTEROL (5 MG/ML) CONTINUOUS INHALATION SOLN
10.0000 mg/h | INHALATION_SOLUTION | Freq: Once | RESPIRATORY_TRACT | Status: AC
  Administered 2012-02-08: 10 mg/h via RESPIRATORY_TRACT

## 2012-02-08 MED ORDER — PREDNISONE 20 MG PO TABS
60.0000 mg | ORAL_TABLET | Freq: Once | ORAL | Status: AC
  Administered 2012-02-08: 60 mg via ORAL
  Filled 2012-02-08: qty 3

## 2012-02-08 MED ORDER — AMLODIPINE BESYLATE 5 MG PO TABS: 5.0000 mg | ORAL_TABLET | Freq: Every day | ORAL | Status: DC

## 2012-02-08 MED ORDER — ALBUTEROL SULFATE (5 MG/ML) 0.5% IN NEBU
INHALATION_SOLUTION | RESPIRATORY_TRACT | Status: AC
  Administered 2012-02-08: 19:00:00
  Filled 2012-02-08: qty 2

## 2012-02-08 NOTE — H&P (Addendum)
[facility] History and Physical  Sylvia [name] ZOX:[id number] DOB: Sep 10, 1970 DOA: 02/08/2012  Referring physician: ER physician PCP: Hollice Espy, MD   Chief Complaint: shortness of breath  HPI:  41 year old female with history of asthma (using symbicort for control of asthma) who presents with worsening shortness of breath for 1 day prior to this admission associated with mild non productive cough but no fever or chills. Patient reports occasional feeling of chest tightness only if she coughs. Also reports audible wheezing. She reports using short acting inhalers mostly. No complaints of abdominal pain, nausea or vomiting. No complaint sof lightheadedness or dizziness or loss of consciousness.  Assessment and Plan:  Principal Problem:  *Acute asthma exacerbation, severe, persistent  Patient is using Symbicort at home for asthma control  In hospital we will change the treatment to albuterol and Atrovent nebulizer Q 2 hours PRN for shortness of breath and/or wheezing  Solumedrol 60 mg Q 12 hours IV; if better by morning this may be changed to PO steroids  O2 support via nasal canula to keep O2 saturation above 90%  Active Problems:  Morbid obesity  Nutrition consult placed  Tachycardia  Likely induced by albuterol  Get EKG 12 lead  Hypertension  Will start Norvasc 5 mg daily  Hydralazine 10 mg IV Q 6 hours PRN if better control needed meanwhile  Code Status: Full Family Communication: Pt at bedside Disposition Plan: Admit for further evaluation to med-surg floor  Manson Passey, MD  [facility] Pager [phone]  If 7PM-7AM, please contact night-coverage [email or web address] Password [email or web address] 02/08/2012, 10:44 PM   Review of Systems:   Constitutional: Negative for fever, chills and malaise/fatigue. Negative for diaphoresis.  HENT: Negative for hearing loss, ear pain, nosebleeds, congestion, sore throat, neck pain, tinnitus and ear discharge.     Eyes: Negative for blurred vision, double vision, photophobia, pain, discharge and redness.  Respiratory: per HPI.   Cardiovascular: Negative for chest pain, palpitations, orthopnea, claudication and leg swelling.  Gastrointestinal: Negative for nausea, vomiting and abdominal pain. Negative for heartburn, constipation, blood in stool and melena.  Genitourinary: Negative for dysuria, urgency, frequency, hematuria and flank pain.  Musculoskeletal: Negative for myalgias, back pain, joint pain and falls.  Skin: Negative for itching and rash.  Neurological: Negative for dizziness and weakness. Negative for tingling, tremors, sensory change, speech change, focal weakness, loss of consciousness and headaches.  Endo/Heme/Allergies: Negative for environmental allergies and polydipsia. Does not bruise/bleed easily.  Psychiatric/Behavioral: Negative for suicidal ideas. The patient is not nervous/anxious.      Past Medical History  Diagnosis Date  . Asthma   . Allergic rhinitis   . Hypertension    Past Surgical History  Procedure Date  . Cesarean section   . Mouth surgery    Social History:  reports that she has never smoked. She has never used smokeless tobacco. She reports that she does not drink alcohol or use illicit drugs.  Allergies  Allergen Reactions  . Latex Rash    Family History: patient reports history of asthma in parents but they are not on active treatment  Prior to Admission medications   Medication Sig Start Date End Date Taking? Authorizing Provider  albuterol (PROVENTIL) (2.5 MG/3ML) 0.083% nebulizer solution Take 2.5 mg by nebulization every 6 (six) hours as needed.     Yes Historical Provider, MD  budesonide-formoterol (SYMBICORT) 160-4.5 MCG/ACT inhaler Inhale 2 puffs into the lungs 2 (two) times daily.     Yes Historical Provider, MD  cetirizine (ZYRTEC) 10 MG tablet Take 10 mg by mouth daily.     Yes Historical Provider, MD  dextromethorphan 15 MG/5ML syrup Take 10  mLs by mouth 4 (four) times daily as needed. Congestion   Yes Historical Provider, MD  EPIPEN 2-PAK 0.3 MG/0.3ML DEVI As directed   Yes Historical Provider, MD  levalbuterol (XOPENEX HFA) 45 MCG/ACT inhaler Inhale 1-2 puffs into the lungs as needed. 11/21/11  Yes Waymon Budge, MD  Multiple Vitamin (MULTIVITAMIN) tablet Take 1 tablet by mouth daily.   Yes Historical Provider, MD   Physical Exam: Filed Vitals:   02/08/12 1746 02/08/12 1826 02/08/12 1833 02/08/12 2235  BP: 158/88 149/70    Pulse: 113 106    Temp: 98.9 F (37.2 C)     TempSrc: Oral     Resp: 23 25    SpO2: 100% 100% 99% 96%    Physical Exam  Constitutional: morbidly obese, No distress.  HENT: Normocephalic. External right and left ear normal. Oropharynx is clear and moist.  Eyes: Conjunctivae and EOM are normal. PERRLA, no scleral icterus.  Neck: Normal ROM. Neck supple. No JVD. No tracheal deviation. No thyromegaly.  CVS: RRR, S1/S2 +, no murmurs, no gallops, no carotid bruit.  Pulmonary: wheezing appreciated over upper and mid lung lobes bilaterally, no crackles or rhonchi apprecaited.  Abdominal: Soft. BS +,  no distension, tenderness, rebound or guarding.  Musculoskeletal: Normal range of motion. No edema and no tenderness.  Lymphadenopathy: No lymphadenopathy noted, cervical, inguinal. Neuro: Alert. Normal reflexes, muscle tone coordination. No cranial nerve deficit. Skin: Skin is warm and dry. No rash noted. Not diaphoretic. No erythema. No pallor.  Psychiatric: Normal mood and affect. Behavior, judgment, thought content normal.   Labs on Admission:  Basic Metabolic Panel: No results found for this basename: NA:5,K:5,CL:5,CO2:5,GLUCOSE:5,BUN:5,CREATININE:5,CALCIUM:5,MG:5,PHOS:5 in the last 168 hours Liver Function Tests: No results found for this basename: AST:5,ALT:5,ALKPHOS:5,BILITOT:5,PROT:5,ALBUMIN:5 in the last 168 hours No results found for this basename: LIPASE:5,AMYLASE:5 in the last 168 hours No  results found for this basename: AMMONIA:5 in the last 168 hours CBC: No results found for this basename: WBC:5,NEUTROABS:5,HGB:5,HCT:5,MCV:5,PLT:5 in the last 168 hours Cardiac Enzymes: No results found for this basename: CKTOTAL:5,CKMB:5,CKMBINDEX:5,TROPONINI:5 in the last 168 hours BNP: No components found with this basename: POCBNP:5 CBG: No results found for this basename: GLUCAP:5 in the last 168 hours  Radiological Exams on Admission: No results found.  EKG: not done on admission

## 2012-02-08 NOTE — ED Notes (Signed)
RUE:[id number]<JW> Expected date:02/08/12<BR> Expected time: 6:01 PM<BR> Means of arrival:<BR> Comments:<BR> Hold for triage 6

## 2012-02-08 NOTE — ED Notes (Signed)
Per patient, states feeling weak on Friday, woke up next morning with shortness of breath-symptoms increased

## 2012-02-08 NOTE — ED Notes (Signed)
Bedside report received from previous RN 

## 2012-02-08 NOTE — ED Provider Notes (Addendum)
History     CSN: [id number]  Arrival date & time 02/08/12  1735   First MD Initiated Contact with Patient 02/08/12 1814      Chief Complaint  Patient presents with  . Shortness of Breath    (Consider location/radiation/quality/duration/timing/severity/associated sxs/prior treatment) The history is provided by the patient.   patient's had shortness of breath since Friday. She's been worse since yesterday. She has a history of asthma. She was seen in urgent care and was sent here. She's had a mild cough. No fevers. No nausea vomiting diarrhea. She is speaking softly but is able to complete sentences but not paragraphs. No abdominal pain. Mild chest tightness. She states that she is seeing Dr. Maple Hudson from pulmonology has had her medications changed recently. She states that they have taken off most of her medications. She states she normally only has to use her rescue inhaler rarely her when she gets infections.  .  Past Medical History  Diagnosis Date  . Asthma   . Allergic rhinitis   . Hypertension     Past Surgical History  Procedure Date  . Cesarean section   . Mouth surgery     Family History  Problem Relation Age of Onset  . Asthma Mother   . Coronary artery disease Maternal Grandmother   . Clotting disorder Maternal Grandmother     History  Substance Use Topics  . Smoking status: Never Smoker   . Smokeless tobacco: Never Used  . Alcohol Use: No    OB History    Grav Para Term Preterm Abortions TAB SAB Ect Mult Living                  Review of Systems  Constitutional: Positive for fatigue. Negative for activity change and appetite change.  HENT: Negative for neck stiffness.   Eyes: Negative for pain.  Respiratory: Positive for cough, chest tightness and shortness of breath.   Cardiovascular: Negative for chest pain and leg swelling.  Gastrointestinal: Negative for nausea, vomiting, abdominal pain and diarrhea.  Genitourinary: Negative for flank pain.    Musculoskeletal: Negative for back pain.  Skin: Negative for rash.  Neurological: Negative for weakness, numbness and headaches.  Psychiatric/Behavioral: Negative for behavioral problems.    Allergies  Latex  Home Medications   Current Outpatient Rx  Name Route Sig Dispense Refill  . ALBUTEROL SULFATE (2.5 MG/3ML) 0.083% IN NEBU Nebulization Take 2.5 mg by nebulization every 6 (six) hours as needed.      . BUDESONIDE-FORMOTEROL FUMARATE 160-4.5 MCG/ACT IN AERO Inhalation Inhale 2 puffs into the lungs 2 (two) times daily.      Marland Kitchen CETIRIZINE HCL 10 MG PO TABS Oral Take 10 mg by mouth daily.      Marland Kitchen DEXTROMETHORPHAN HBR 15 MG/5ML PO SYRP Oral Take 10 mLs by mouth 4 (four) times daily as needed. Congestion    . EPIPEN 2-PAK 0.3 MG/0.3ML IJ DEVI  As directed    . LEVALBUTEROL TARTRATE 45 MCG/ACT IN AERO Inhalation Inhale 1-2 puffs into the lungs as needed.    Marland Kitchen ONE-DAILY MULTI VITAMINS PO TABS Oral Take 1 tablet by mouth daily.      BP 164/79  Pulse 105  Temp 98.9 F (37.2 C) (Oral)  Resp 25  SpO2 97%  LMP 01/29/2012  Physical Exam  Nursing note and vitals reviewed. Constitutional: She is oriented to person, place, and time. She appears well-developed and well-nourished.  HENT:  Head: Normocephalic and atraumatic.  Eyes: EOM are normal.  Pupils are equal, round, and reactive to light.  Neck: Normal range of motion. Neck supple.  Cardiovascular: Regular rhythm and normal heart sounds.   No murmur heard.      Mild tachycardia  Pulmonary/Chest: She is in respiratory distress. She has wheezes. She has no rales.       Mild respiratory distress. Unable to complete a full paragraph. Diffuse wheezes and prolonged expirations.  Abdominal: Soft. Bowel sounds are normal. She exhibits no distension. There is no tenderness. There is no rebound and no guarding.  Musculoskeletal: Normal range of motion. She exhibits no edema.  Neurological: She is alert and oriented to person, place, and time.  No cranial nerve deficit.  Skin: Skin is warm and dry.  Psychiatric: She has a normal mood and affect. Her speech is normal.    ED Course  Procedures (including critical care time)  Labs Reviewed - No data to display No results found.   1. Acute asthma exacerbation   2. Morbid obesity      Date: 04/12/2012  Rate: 115  Rhythm: sinus tachycardia  QRS Axis: normal  Intervals: normal  ST/T Wave abnormalities: nonspecific T wave changes  Conduction Disutrbances:none  Narrative Interpretation:   Old EKG Reviewed: unchanged    MDM  Patient with asthma exacerbation. Some mild improvement with steroids and breathing treatment, but she is still to just make for good walk. Lung sounds are equal bilaterally. There is no localizing finding that concerns me about pneumonia. Patient be admitted to medicine.        Juliet Rude. Rubin Payor, MD 02/08/12 2355  Juliet Rude. Rubin Payor, MD 04/12/12 [phone]

## 2012-02-09 DIAGNOSIS — I10 Essential (primary) hypertension: Secondary | ICD-10-CM | POA: Diagnosis present

## 2012-02-09 DIAGNOSIS — T380X5A Adverse effect of glucocorticoids and synthetic analogues, initial encounter: Secondary | ICD-10-CM

## 2012-02-09 DIAGNOSIS — R000 Tachycardia, unspecified: Secondary | ICD-10-CM | POA: Diagnosis present

## 2012-02-09 DIAGNOSIS — E876 Hypokalemia: Secondary | ICD-10-CM | POA: Diagnosis present

## 2012-02-09 DIAGNOSIS — R739 Hyperglycemia, unspecified: Secondary | ICD-10-CM

## 2012-02-09 HISTORY — DX: Hyperglycemia, unspecified: T38.0X5A

## 2012-02-09 HISTORY — DX: Hyperglycemia, unspecified: R73.9

## 2012-02-09 LAB — CBC
HCT: 41 % (ref 36.0–46.0)
Hemoglobin: 13.7 g/dL (ref 12.0–15.0)
MCH: 30.2 pg (ref 26.0–34.0)
MCHC: 33.4 g/dL (ref 30.0–36.0)
MCV: 90.5 fL (ref 78.0–100.0)
Platelets: 326 10*3/uL (ref 150–400)
RBC: 4.53 MIL/uL (ref 3.87–5.11)
RDW: 12.9 % (ref 11.5–15.5)
WBC: 14 10*3/uL — ABNORMAL HIGH (ref 4.0–10.5)

## 2012-02-09 LAB — COMPREHENSIVE METABOLIC PANEL
ALT: 10 U/L (ref 0–35)
AST: 13 U/L (ref 0–37)
Albumin: 3.6 g/dL (ref 3.5–5.2)
Alkaline Phosphatase: 81 U/L (ref 39–117)
BUN: 10 mg/dL (ref 6–23)
CO2: 24 mEq/L (ref 19–32)
Calcium: 9.3 mg/dL (ref 8.4–10.5)
Chloride: 101 mEq/L (ref 96–112)
Creatinine, Ser: 0.59 mg/dL (ref 0.50–1.10)
GFR calc Af Amer: >90 mL/min (ref >90)
GFR calc non Af Amer: >90 mL/min (ref >90)
Glucose, Bld: 181 mg/dL — ABNORMAL HIGH (ref 70–99)
Potassium: 3.4 mEq/L — ABNORMAL LOW (ref 3.5–5.1)
Sodium: 135 mEq/L (ref 135–145)
Total Bilirubin: 0.2 mg/dL — ABNORMAL LOW (ref 0.3–1.2)
Total Protein: 7.2 g/dL (ref 6.0–8.3)

## 2012-02-09 LAB — APTT: aPTT: 31 seconds (ref 24–37)

## 2012-02-09 LAB — DIFFERENTIAL
Basophils Absolute: 0 10*3/uL (ref 0.0–0.1)
Basophils Relative: 0 % (ref 0–1)
Eosinophils Absolute: 0 10*3/uL (ref 0.0–0.7)
Eosinophils Relative: 0 % (ref 0–5)
Lymphocytes Relative: 4 % — ABNORMAL LOW (ref 12–46)
Lymphs Abs: 0.5 10*3/uL — ABNORMAL LOW (ref 0.7–4.0)
Monocytes Absolute: 0.1 10*3/uL (ref 0.1–1.0)
Monocytes Relative: 1 % — ABNORMAL LOW (ref 3–12)
Neutro Abs: 13.4 10*3/uL — ABNORMAL HIGH (ref 1.7–7.7)
Neutrophils Relative %: 96 % — ABNORMAL HIGH (ref 43–77)

## 2012-02-09 LAB — HEMOGLOBIN A1C
Hgb A1c MFr Bld: 5.2 % (ref <5.7)
Mean Plasma Glucose: 103 mg/dL (ref <117)

## 2012-02-09 LAB — MAGNESIUM: Magnesium: 2 mg/dL (ref 1.5–2.5)

## 2012-02-09 LAB — PROTIME-INR
INR: 1.07 (ref 0.00–1.49)
Prothrombin Time: 14.1 seconds (ref 11.6–15.2)

## 2012-02-09 LAB — PHOSPHORUS: Phosphorus: 2.2 mg/dL — ABNORMAL LOW (ref 2.3–4.6)

## 2012-02-09 LAB — GLUCOSE, CAPILLARY
Glucose-Capillary: 126 mg/dL — ABNORMAL HIGH (ref 70–99)
Glucose-Capillary: 184 mg/dL — ABNORMAL HIGH (ref 70–99)
Glucose-Capillary: 117 mg/dL — ABNORMAL HIGH (ref 70–99)
Glucose-Capillary: 172 mg/dL — ABNORMAL HIGH (ref 70–99)

## 2012-02-09 LAB — TSH: TSH: 0.273 u[IU]/mL — ABNORMAL LOW (ref 0.350–4.500)

## 2012-02-09 MED ORDER — ALBUTEROL SULFATE (5 MG/ML) 0.5% IN NEBU
2.5000 mg | INHALATION_SOLUTION | RESPIRATORY_TRACT | Status: DC
  Administered 2012-02-09 – 2012-02-10 (×7): 2.5 mg via RESPIRATORY_TRACT
  Filled 2012-02-09 (×6): qty 0.5

## 2012-02-09 MED ORDER — AZITHROMYCIN 250 MG PO TABS
250.0000 mg | ORAL_TABLET | Freq: Every day | ORAL | Status: DC
  Administered 2012-02-10: 250 mg via ORAL
  Filled 2012-02-09: qty 1

## 2012-02-09 MED ORDER — IPRATROPIUM BROMIDE 0.02 % IN SOLN
0.5000 mg | RESPIRATORY_TRACT | Status: DC | PRN
  Administered 2012-02-09: 0.5 mg via RESPIRATORY_TRACT
  Filled 2012-02-09: qty 2.5

## 2012-02-09 MED ORDER — ALBUTEROL SULFATE (5 MG/ML) 0.5% IN NEBU
2.5000 mg | INHALATION_SOLUTION | RESPIRATORY_TRACT | Status: DC | PRN
  Administered 2012-02-09: 2.5 mg via RESPIRATORY_TRACT
  Filled 2012-02-09: qty 0.5

## 2012-02-09 MED ORDER — ENOXAPARIN SODIUM 60 MG/0.6ML ~~LOC~~ SOLN
55.0000 mg | SUBCUTANEOUS | Status: DC
  Administered 2012-02-09 – 2012-02-10 (×2): 55 mg via SUBCUTANEOUS
  Filled 2012-02-09 (×2): qty 0.6

## 2012-02-09 MED ORDER — ALBUTEROL SULFATE (5 MG/ML) 0.5% IN NEBU
2.5000 mg | INHALATION_SOLUTION | RESPIRATORY_TRACT | Status: DC | PRN
  Filled 2012-02-09 (×2): qty 0.5

## 2012-02-09 MED ORDER — POTASSIUM CHLORIDE CRYS ER 20 MEQ PO TBCR
40.0000 meq | EXTENDED_RELEASE_TABLET | Freq: Once | ORAL | Status: AC
  Administered 2012-02-09: 40 meq via ORAL
  Filled 2012-02-09: qty 2

## 2012-02-09 MED ORDER — PNEUMOCOCCAL VAC POLYVALENT 25 MCG/0.5ML IJ INJ
0.5000 mL | INJECTION | INTRAMUSCULAR | Status: AC
  Administered 2012-02-10: 0.5 mL via INTRAMUSCULAR
  Filled 2012-02-09: qty 0.5

## 2012-02-09 MED ORDER — LORATADINE 10 MG PO TABS
10.0000 mg | ORAL_TABLET | Freq: Every day | ORAL | Status: DC
  Administered 2012-02-09 – 2012-02-10 (×2): 10 mg via ORAL
  Filled 2012-02-09 (×2): qty 1

## 2012-02-09 MED ORDER — INSULIN ASPART 100 UNIT/ML ~~LOC~~ SOLN
0.0000 [IU] | Freq: Three times a day (TID) | SUBCUTANEOUS | Status: DC
  Administered 2012-02-09: 3 [IU] via SUBCUTANEOUS
  Administered 2012-02-10: 2 [IU] via SUBCUTANEOUS

## 2012-02-09 MED ORDER — AZITHROMYCIN 500 MG PO TABS
500.0000 mg | ORAL_TABLET | Freq: Every day | ORAL | Status: AC
  Administered 2012-02-09: 500 mg via ORAL
  Filled 2012-02-09: qty 1

## 2012-02-09 MED ORDER — METHYLPREDNISOLONE SODIUM SUCC 125 MG IJ SOLR
60.0000 mg | Freq: Two times a day (BID) | INTRAMUSCULAR | Status: DC
  Administered 2012-02-09 – 2012-02-10 (×3): 60 mg via INTRAVENOUS
  Filled 2012-02-09 (×5): qty 0.96

## 2012-02-09 MED ORDER — ADULT MULTIVITAMIN W/MINERALS CH
1.0000 | ORAL_TABLET | Freq: Every day | ORAL | Status: DC
  Administered 2012-02-09 – 2012-02-10 (×2): 1 via ORAL
  Filled 2012-02-09 (×2): qty 1

## 2012-02-09 MED ORDER — DEXTROMETHORPHAN POLISTIREX 30 MG/5ML PO LQCR
30.0000 mg | Freq: Four times a day (QID) | ORAL | Status: DC | PRN
  Filled 2012-02-09: qty 5

## 2012-02-09 MED ORDER — IPRATROPIUM BROMIDE 0.02 % IN SOLN: 0.5000 mg | RESPIRATORY_TRACT | Status: DC | PRN

## 2012-02-09 MED ORDER — DEXTROMETHORPHAN HBR 15 MG/5ML PO SYRP: 10.0000 mL | ORAL_SOLUTION | Freq: Four times a day (QID) | ORAL | Status: DC | PRN

## 2012-02-09 MED ORDER — INSULIN ASPART 100 UNIT/ML ~~LOC~~ SOLN: 0.0000 [IU] | Freq: Every day | SUBCUTANEOUS | Status: DC

## 2012-02-09 NOTE — Plan of Care (Signed)
Problem: Food- and Nutrition-Related Knowledge Deficit (NB-1.1) Goal: Nutrition education Formal process to instruct or train a patient/client in a skill or to impart knowledge to help patients/clients voluntarily manage or modify food choices and eating behavior to maintain or improve health.  Outcome: Completed/Met Date Met:  02/09/12 Pt with BMI of 42.74 kg/(m^2), meets criteria for extreme class III obesity. Met with pt who reports she has been eating healthier since January of this year with resulting weight reduction of 2 dress sizes, pt unsure of pounds of weight lost. Pt reports she has been eating [place] yogurt, steamed vegetables, grilled meats, fruits, and avoiding frying foods, sodas, juices, and processed foods. Pt reports she is still struggling with monitoring her portion sizes, but she is getting better. Pt had questions regarding what foods were a part of the heart healthy diet - discussed importance of low fat/cholesterol and low sodium foods with an emphasis on adding whole grains and fiber. Provided pt with a handout on heart healthy diet, pt appreciative. Expect good compliance.

## 2012-02-09 NOTE — Progress Notes (Signed)
[facility] PROGRESS NOTE  Sylvia [name] EAV:[id number] DOB: 05-Nov-1970 DOA: 02/08/2012 PCP: Hollice Espy, MD  Assessment/Plan: Principal Problem:  *Acute asthma exacerbation  Admitted and given high dose solumedrol, Q 4 hour bronchodilators.  Will add empiric azithromycin given leukocytosis and reports of sputum production (to cover atypical infections).  States she had a chest X-ray at Urgent Care, so would not repeat.  Continue supplemental oxygen. Active Problems:  Morbid obesity  Dietician consulted.  Hypokalemia  Likely from albuterol.  Replace orally.  Tachycardia  Treat bronchospasm.  Bronchodilators likely contributory.  Hypertension  Started on Hydralazine when necessary and scheduled dose Norvasc.  Steroid-induced hyperglycemia  Check hemoglobin A1c to rule out underlying impaired fasting glucose or diabetes.  Start sliding scale insulin.  Code Status: Full.  Family Communication: None at bedside. Disposition Plan: Home when stable.    Brief narrative: Sylvia [name] is a 41 year old female who was admitted on 02/08/2012 with an acute asthma exacerbation.  Medical Consultants:  None.  Other Consultants:  None.  Procedures:  None.  Antibiotics:  Azithromycin 02/09/12--->  HPI/Subjective: Sylvia [name] is still very short of breath this morning. She does have a cough that is productive of gray-colored sputum. She has pleuritic type chest pain with coughing spells. No frank fever or chills. Appetite is good. She says her last asthma exacerbation was in December and that she had to be hospitalized at that time as well.  Objective: Filed Vitals:   02/09/12 0030 02/09/12 0133 02/09/12 0557 02/09/12 0745  BP: 150/77 145/85 164/83 145/85  Pulse: 104 106 97   Temp:  98.5 F (36.9 C) 98 F (36.7 C)   TempSrc:  Oral Oral   Resp:  21 19   Height:  5\' 4"  (1.626 m)    Weight:  112.946 kg (249 lb)    SpO2: 98% 95% 100%     Intake/Output  Summary (Last 24 hours) at 02/09/12 0801 Last data filed at 02/09/12 0558  Gross per 24 hour  Intake    120 ml  Output    200 ml  Net    -80 ml    Exam: Gen:  NAD Cardiovascular:  RRR, No M/R/G Respiratory: Lungs with expiratory wheezes. Gastrointestinal: Abdomen soft, NT/ND with normal active bowel sounds. Extremities: No C/E/C  Data Reviewed: Basic Metabolic Panel:  Lab 02/09/12 7829  NA 135  K 3.4*  CL 101  CO2 24  GLUCOSE 181*  BUN 10  CREATININE 0.59  CALCIUM 9.3  MG 2.0  PHOS 2.2*   GFR Estimated Creatinine Clearance: 115.1 ml/min (by C-G formula based on Cr of 0.59). Liver Function Tests:  Lab 02/09/12 0236  AST 13  ALT 10  ALKPHOS 81  BILITOT 0.2*  PROT 7.2  ALBUMIN 3.6   Coagulation profile  Lab 02/09/12 0236  INR 1.07  PROTIME --    CBC:  Lab 02/09/12 0236  WBC 14.0*  NEUTROABS 13.4*  HGB 13.7  HCT 41.0  MCV 90.5  PLT 326    Studies: No results found.  Scheduled Meds:    . albuterol  2.5 mg Nebulization Q4H  . albuterol  5 mg Nebulization Once  . albuterol      . albuterol  10 mg/hr Nebulization Once  . amLODipine  5 mg Oral Daily  . enoxaparin (LOVENOX) injection  55 mg Subcutaneous Q24H  . ipratropium  0.5 mg Nebulization Once  . loratadine  10 mg Oral Daily  . methylPREDNISolone (SOLU-MEDROL) injection  60 mg Intravenous Q12H  .  multivitamin with minerals  1 tablet Oral Daily  . pneumococcal 23 valent vaccine  0.5 mL Intramuscular Tomorrow-1000  . predniSONE  60 mg Oral Once  . DISCONTD: amLODipine  5 mg Oral Daily   Continuous Infusions:   Time spent: 35 minutes.   LOS: 1 day   [name]  [facility] Pager [phone].  If 8PM-8AM, please contact night-coverage at [email or web address], password [facility] 02/09/2012, 8:01 AM

## 2012-02-09 NOTE — Progress Notes (Signed)
UR done. 

## 2012-02-10 ENCOUNTER — Encounter (HOSPITAL_COMMUNITY): Payer: Self-pay | Admitting: Internal Medicine

## 2012-02-10 DIAGNOSIS — R7989 Other specified abnormal findings of blood chemistry: Secondary | ICD-10-CM

## 2012-02-10 HISTORY — DX: Other specified abnormal findings of blood chemistry: R79.89

## 2012-02-10 LAB — GLUCOSE, CAPILLARY: Glucose-Capillary: 124 mg/dL — ABNORMAL HIGH (ref 70–99)

## 2012-02-10 MED ORDER — AMLODIPINE BESYLATE 5 MG PO TABS: 5.0000 mg | ORAL_TABLET | Freq: Every day | ORAL | Status: DC

## 2012-02-10 MED ORDER — PREDNISONE (PAK) 10 MG PO TABS: ORAL_TABLET | ORAL | Status: AC

## 2012-02-10 MED ORDER — AZITHROMYCIN 250 MG PO TABS: ORAL_TABLET | ORAL | Status: AC

## 2012-02-10 MED ORDER — LEVALBUTEROL TARTRATE 45 MCG/ACT IN AERO: 1.0000 | INHALATION_SPRAY | RESPIRATORY_TRACT | Status: DC | PRN

## 2012-02-10 NOTE — Plan of Care (Signed)
Problem: Discharge Progression Outcomes Goal: Discharge plan in place and appropriate Outcome: Completed/Met Date Met:  02/10/12 Home today 02/10/12.

## 2012-02-10 NOTE — Discharge Summary (Signed)
Physician Discharge Summary  Sylvia [name] ZOX:[id number] DOB: [birth year] DOA: 02/08/2012  PCP: Sylvia Espy, MD  Admit date: 02/08/2012 Discharge date: 02/10/2012  Recommendations for Outpatient Follow-up:  1. F/U thyroid function testing.  TSH slightly low.  Needs free T4 or follow up TSH in 4-6 weeks (patient preferred not to wait to have additional lab work done prior to d/c) 2. Recommend outpatient hemoglobin A1c given steroid induced hyperglycemia. 3. Close F/U of BP. D/C'd on Norvasc for newly diagnosed hypertension.  Discharge Diagnoses:   Principal Problem:  *Acute asthma exacerbation  Active Problems:   Morbid obesity, BMI 42.74   Hypokalemia   Tachycardia   Hypertension   Steroid-induced hyperglycemia   Low TSH   Discharge Condition: Improved.  Diet recommendation: Low sodium, heart healthy.  History of present illness:  Sylvia [name] is a 41 year old female who was admitted on 02/08/2012 with an acute asthma exacerbation.  Hospital Course by problem:  Principal Problem:  *Acute asthma exacerbation  Admitted and given high dose solumedrol, Q 4 hour bronchodilators.  Empiric azithromycin added 02/09/12 given leukocytosis and reports of sputum production (to cover atypical infections). States she had a chest X-ray at Urgent Care, so we did not repeat.  Continue supplemental oxygen. Active Problems:  Morbid obesity BMI 42.74  Dietician consulted and provided teaching on 02/09/12. Hypokalemia  Likely from albuterol. Replaced orally. Tachycardia / Low TSH  Treated bronchospasm. Bronchodilators likely contributory. TSH low, ? Hyperthyroidism.  Patient preferred not to have further labs drawn, and knows to F/U with her PCP for further testing. Hypertension  Started on Hydralazine when necessary and scheduled dose Norvasc. D/C on Norvasc with close F/U with PCP. Steroid-induced hyperglycemia  Recommend an outpatient check hemoglobin A1c to rule out underlying  impaired fasting glucose or diabetes.  Start sliding scale insulin.  Procedures:  None  Consultations:  None  Discharge Exam: Filed Vitals:   02/10/12 1012  BP: 142/80  Pulse:   Temp:   Resp:    Filed Vitals:   02/10/12 0615 02/10/12 0700 02/10/12 0831 02/10/12 1012  BP: 142/66   142/80  Pulse: 93     Temp: 98.3 F (36.8 C)     TempSrc: Oral     Resp: 18     Height:      Weight:  112.764 kg (248 lb 9.6 oz)    SpO2: 97%  97%     Gen:  NAD Cardiovascular:  RRR, No M/R/G Respiratory: Lungs CTAB Gastrointestinal: Abdomen soft, NT/ND with normal active bowel sounds. Extremities: No C/E/C   Discharge Instructions  Discharge Orders    Future Appointments: Provider: Department: Dept Phone: Center:   04/01/2012 2:15 PM Sylvia Budge, MD [facility] [phone] None     Future Orders Please Complete By Expires   Diet - low sodium heart healthy      Increase activity slowly      Call MD for:  temperature >100.4      Call MD for:      Scheduling Instructions:   Worsening shortness of breath, cough, or a change in the quantity or quality of your mucous.     Medication List  As of 02/10/2012 10:26 AM   TAKE these medications         albuterol (2.5 MG/3ML) 0.083% nebulizer solution   Commonly known as: PROVENTIL   Take 2.5 mg by nebulization every 6 (six) hours as needed.      amLODipine 5 MG tablet   Commonly known as:  NORVASC   Take 1 tablet (5 mg total) by mouth daily.      azithromycin 250 MG tablet   Commonly known as: ZITHROMAX   Take for 3 more days.      budesonide-formoterol 160-4.5 MCG/ACT inhaler   Commonly known as: SYMBICORT   Inhale 2 puffs into the lungs 2 (two) times daily.      cetirizine 10 MG tablet   Commonly known as: ZYRTEC   Take 10 mg by mouth daily.      dextromethorphan 15 MG/5ML syrup   Take 10 mLs by mouth 4 (four) times daily as needed. Congestion      EPIPEN 2-PAK 0.3 mg/0.3 mL Devi   Generic drug: EPINEPHrine   As  directed      levalbuterol 45 MCG/ACT inhaler   Commonly known as: XOPENEX HFA   Inhale 1-2 puffs into the lungs as needed.      multivitamin tablet   Take 1 tablet by mouth daily.      predniSONE 10 MG tablet   Commonly known as: STERAPRED UNI-PAK   Take 6 tablets on 02/11/12, decrease by 1 tablet daily until off.              The results of significant diagnostics from this hospitalization (including imaging, microbiology, ancillary and laboratory) are listed below for reference.    Significant Diagnostic Studies: No results found.  Microbiology: No results found for this or any previous visit (from the past 240 hour(s)).   Labs: Basic Metabolic Panel:  Lab 02/09/12 6213  NA 135  K 3.4*  CL 101  CO2 24  GLUCOSE 181*  BUN 10  CREATININE 0.59  CALCIUM 9.3  MG 2.0  PHOS 2.2*   Liver Function Tests:  Lab 02/09/12 0236  AST 13  ALT 10  ALKPHOS 81  BILITOT 0.2*  PROT 7.2  ALBUMIN 3.6   CBC:  Lab 02/09/12 0236  WBC 14.0*  NEUTROABS 13.4*  HGB 13.7  HCT 41.0  MCV 90.5  PLT 326   CBG:  Lab 02/10/12 0758 02/09/12 2108 02/09/12 1635 02/09/12 1239 02/09/12 0812  GLUCAP 124* 172* 117* 184* 126*    Time coordinating discharge: 25 minutes.  Signed:  Keymiah [name]  Pager [phone] [facility] 02/10/2012, 10:26 AM

## 2012-03-25 ENCOUNTER — Ambulatory Visit (INDEPENDENT_AMBULATORY_CARE_PROVIDER_SITE_OTHER): Payer: BC Managed Care – PPO | Admitting: Obstetrics and Gynecology

## 2012-03-25 ENCOUNTER — Encounter: Payer: Self-pay | Admitting: Obstetrics and Gynecology

## 2012-03-25 VITALS — BP 120/70 | HR 72 | Resp 16 | Ht 64.0 in | Wt 237.0 lb

## 2012-03-25 DIAGNOSIS — Z124 Encounter for screening for malignant neoplasm of cervix: Secondary | ICD-10-CM

## 2012-03-25 DIAGNOSIS — Z01419 Encounter for gynecological examination (general) (routine) without abnormal findings: Secondary | ICD-10-CM

## 2012-03-25 NOTE — Progress Notes (Signed)
ANNUAL GYNECOLOGIC EXAMINATION   Sylvia [name] is a 41 y.o. female, G1P1, who presents for an annual exam. The patient notes that her asthma gets much worse prior to her menstrual cycles.  She is working with her pulmonologist.  She is using condoms for contraception.  Prior Hysterectomy: No    History   Social History  . Marital Status: Married    Spouse Name: N/A    Number of Children: N/A  . Years of Education: N/A   Occupational History  .      [employer] biology de[employer]   Social History Main Topics  . Smoking status: Never Smoker   . Smokeless tobacco: Never Used  . Alcohol Use: No  . Drug Use: No  . Sexually Active: Yes    Birth Control/ Protection: Other-see comments     OTC methods   Other Topics Concern  . None   Social History Narrative  . None    Menstrual cycle:   LMP: Patient's last menstrual period was 03/12/2012.             The following portions of the patient's history were reviewed and updated as appropriate: allergies, current medications, past family history, past medical history, past social history, past surgical history and problem list.  Review of Systems Pertinent items are noted in HPI. Breast:Negative for breast lump,nipple discharge or nipple retraction Gastrointestinal: Negative for abdominal pain, change in bowel habits or rectal bleeding Urinary:negative   Objective:    BP 120/70  Pulse 72  Resp 16  Ht 5\' 4"  (1.626 m)  Wt 237 lb (107.502 kg)  BMI 40.68 kg/m2  LMP 03/12/2012    Weight:  Wt Readings from Last 1 Encounters:  03/25/12 237 lb (107.502 kg)          BMI: Body mass index is 40.68 kg/(m^2).  General Appearance: Alert, appropriate appearance for age. No acute distress HEENT: Grossly normal Neck / Thyroid: Supple, no masses, nodes or enlargement Lungs: clear to auscultation bilaterally Back: No CVA tenderness Breast Exam: No masses or nodes.No dimpling, nipple retraction or discharge. Cardiovascular:  Regular rate and rhythm. S1, S2, no murmur Gastrointestinal: Soft, non-tender, no masses or organomegaly  ++++++++++++++++++++++++++++++++++++++++++++++++++++++++  Pelvic Exam: External genitalia: normal general appearance Vaginal: normal without tenderness, induration or masses. Relaxation: Yes Cervix: normal appearance Adnexa: normal bimanual exam Uterus: normal size, shape, and consistency Rectovaginal: normal rectal, no masses  ++++++++++++++++++++++++++++++++++++++++++++++++++++++++  Lymphatic Exam: Non-palpable nodes in neck, clavicular, axillary, or inguinal regions Neurologic: Normal speech, no tremor  Psychiatric: Alert and oriented, appropriate affect.  Assessment:    Normal gyn exam   Overweight or obese: Yes   Pelvic relaxation: Yes  Exacerbation of asthma symptoms prior to her menstrual cycle.  hypertension   Plan:    mammogram pap smear return annually or prn Contraception:condoms   The patient will call or return to see [place] if her pulmonologist is unable to help her with her asthma during the premenstrual time.  Medications prescribed: none  STD screen request: No   The updated Pap smear screening guidelines were discussed with the patient. The patient requested that I obtain a Pap smear: Yes.  Kegel exercises discussed: Yes.  Proper diet and regular exercise were reviewed.  Annual mammograms recommended starting at age 77. Proper breast care was discussed.  Screening colonoscopy is recommended beginning at age 71.  Regular health maintenance was reviewed.  Sleep hygiene was discussed.  Adequate calcium and vitamin D intake was emphasized.  Leonard Schwartz M.D.    Regular Periods: yes Mammogram No  Monthly Breast Ex.: yes Exercise: no  Tetanus < 10 years: yes Seatbelts: yes  NI. Bladder Functn.: yes Abuse at home: no  Daily BM's: yes Stressful Work: yes  Healthy Diet: yes Sigmoid-Colonoscopy: No  Calcium: no Medical problems  this year: Asthma 8/13   LAST PAP 08/12/2010 WNL  Contraception: OTC methods  Mammogram:  NONE  PCP: Shaune Pollack  PMH: NONE  FMH: NONE  Last Bone Scan: NONE

## 2012-03-26 LAB — PAP IG W/ RFLX HPV ASCU

## 2012-04-01 ENCOUNTER — Ambulatory Visit (INDEPENDENT_AMBULATORY_CARE_PROVIDER_SITE_OTHER)
Admission: RE | Admit: 2012-04-01 | Discharge: 2012-04-01 | Disposition: A | Discharge status: Home / Self Care | Payer: BC Managed Care – PPO | Source: Ambulatory Visit | Attending: Internal Medicine | Admitting: Internal Medicine

## 2012-04-01 ENCOUNTER — Ambulatory Visit (INDEPENDENT_AMBULATORY_CARE_PROVIDER_SITE_OTHER): Payer: BC Managed Care – PPO | Admitting: Internal Medicine

## 2012-04-01 ENCOUNTER — Encounter: Payer: Self-pay | Admitting: Internal Medicine

## 2012-04-01 VITALS — BP 140/82 | HR 89 | Ht 64.0 in | Wt 258.2 lb

## 2012-04-01 DIAGNOSIS — J45901 Unspecified asthma with (acute) exacerbation: Secondary | ICD-10-CM

## 2012-04-01 DIAGNOSIS — Z23 Encounter for immunization: Secondary | ICD-10-CM

## 2012-04-01 DIAGNOSIS — J45909 Unspecified asthma, uncomplicated: Secondary | ICD-10-CM

## 2012-04-01 MED ORDER — METHYLPREDNISOLONE ACETATE 80 MG/ML IJ SUSP
80.0000 mg | Freq: Once | INTRAMUSCULAR | Status: AC
  Administered 2012-04-01: 80 mg via INTRAMUSCULAR

## 2012-04-01 MED ORDER — BUDESONIDE-FORMOTEROL FUMARATE 160-4.5 MCG/ACT IN AERO: INHALATION_SPRAY | RESPIRATORY_TRACT | Status: DC

## 2012-04-01 MED ORDER — LEVALBUTEROL HCL 0.63 MG/3ML IN NEBU
0.6300 mg | INHALATION_SOLUTION | Freq: Once | RESPIRATORY_TRACT | Status: AC
  Administered 2012-04-01: 0.63 mg via RESPIRATORY_TRACT

## 2012-04-01 NOTE — Progress Notes (Signed)
Patient ID: Sylvia [name], female    DOB: [birth year], 41 y.o.   MRN: [id number]  HPI 01/20/11- 67 yoF never smoker followed for asthma, allergic rhinitis Last here September 17, 2010- note reviewed She had been doing very well. Last night home was more humid. They monitor humidity levels.  She had been wheezing and coughing some all day, but woke around 2 AM worse. Husband cranked up the dehumidifiers and she took 2 neb treatments 30 minutes apart. After that she calmed down and was able to get back to sleep. Today she is still a little tight.  Over time she has felt she was doing much better, using the combination of symbicort, Qvar and Singulair. Denies recent cold, but daughter had a sinus infection and cough.   04/24/11- [name] never smoker followed for asthma, allergic rhinitis...Marland KitchenMarland Kitchenhere w/ husband and daughter 4 days ago she came down with a cold, shared with her husband. By the next night she had cough and wheezing was using her nebulizer every 8 hours as helps her cough became more productive but makes her shaky. She feels worse lying down and better if she props up to sleep. Some pressure in the right ear and both maxillary sinuses with no purulent discharge and no sore throat.  06/30/11- [name] never smoker followed for asthma, allergic rhinitis Post hospital visit- [facility] viral bronchitis syndrome. Still tapering prednisone. Still productive cough, weak and tired. Supine feels soreness upper anterior chest wall. Did have flu vax. Was not given Tamiflu. While in hospital she had asked restricted access to her records to close she did not want calls from her employer. She feels washed out now after her recent illness as she tapers prednisone, but otherwise well.  12/25/11- [name] never smoker followed for asthma, allergic rhinitis Has noticed she is having stuffiness in nasal area; overall doing well with breathing. She has completely resolved the post viral bronchitis for which she was  hospitalized before our last visit. She is now doing well and feels back to normal. Some nasal congestion blamed on the weather. Still using multiple medications which we reviewed.  04/01/12- [name] never smoker followed for asthma, allergic rhinitis Follow up to hospital stay--states its taking her awhile to "bounce back", wakes up mid night to use rescue inhaler and most mornings  Post hospital visit (8/4 through 02/10/2012)-discharge diagnoses acute exacerbation of asthma Breathing still feels "Croupy" with cough especially at night, post nasal drip, wheeze. This week she is waking early with cough and wheeze. Nebulizer helps. Has never felt clear since hospital. Using rescue inhaler once or twice daily mainly for shortness of breath if she climbs stairs. Educated on wheezing versus exertional shortness of breath FENO- + markedly elevated 109, consistent with active and potentially allergic airway irritation  Review of Systems-see HPI Constitutional:   No-   weight loss, night sweats, fevers, chills, fatigue, lassitude. HEENT:   No-   headaches, difficulty swallowing, tooth/dental problems, sore throat,                  No-   sneezing, itching, ear ache,   +nasal congestion, post nasal drip,  CV:  No-   chest pain, orthopnea, PND, swelling in lower extremities, anasarca, dizziness, palpitations GI:  No-   heartburn, indigestion, abdominal pain, nausea, vomiting,  Resp: Still easy shortness of breath with exertion or at rest.  No-  excess mucus,  No-  coughing up of blood.              No-   change in color of mucus.     Skin: No-   rash or lesions. GU:  MS:  No-   joint pain or swelling.  Marland Kitchen Psych:  No- change in mood or affect. No depression or anxiety.  No memory loss.     Objective:   Physical Exam General- Alert, Oriented, Affect-appropriate, Distress- none acute. obese Skin- rash-none, lesions- none, excoriation- none Lymphadenopathy- none Head- atraumatic             Eyes- Gross vision intact, PERRLA, conjunctivae clear secretions            Ears- Hearing, canals- look normal            Nose- pale edema in the distal right nostril might be the surface of a polyp, No- Septal dev, mucus, polyps, erosion, perforation             Throat- Mallampati III-IV , mucosa clear , drainage- none, tonsils- atrophic. Throat clearing. Neck- flexible , trachea midline, no stridor , thyroid nl, carotid no bruit Chest - symmetrical excursion , unlabored           Heart/CV- RRR , no murmur , no gallop  , no rub, nl s1 s2                           - JVD- none , edema- none, stasis changes- none, varices- none           Lung- + bilateral wheeze , dullness-none, rub- none           Chest wall-  Abd-  Br/ Gen/ Rectal- Not done, not indicated Extrem- cyanosis- none, clubbing, none, atrophy- none, strength- nl Neuro- grossly intact to observation

## 2012-04-01 NOTE — Patient Instructions (Addendum)
Script sent to resume Symbicort as a maintenance inhaler every day  FENO   Neb Xop 0.63  Depo 80  Flu vax  Order CXR  Dx Asthma

## 2012-04-11 NOTE — Assessment & Plan Note (Addendum)
Sustained exacerbation never really cleared during hospital stay almost 6 weeks ago. We discussed environmental triggers. Throat clearing invites possibility of reflux. Body habitus invites consideration of obesity hypoventilation and OSA. Elevated FENO score implies ongoing inflammation, potentially steroid sensitive. We do not want to push systemic steroids given her obesity and borderline diabetic status. She is using full dose Symbicort.

## 2012-04-14 ENCOUNTER — Telehealth: Payer: Self-pay | Admitting: Internal Medicine

## 2012-04-14 NOTE — Progress Notes (Signed)
Quick Note:  Pt aware of results. ______ 

## 2012-04-14 NOTE — Telephone Encounter (Signed)
Spoke with patient-aware of CXR results-normal and clear.

## 2012-04-15 ENCOUNTER — Encounter: Payer: Self-pay | Admitting: Internal Medicine

## 2012-04-28 ENCOUNTER — Telehealth: Payer: Self-pay | Admitting: Internal Medicine

## 2012-04-28 MED ORDER — PREDNISONE 20 MG PO TABS: 20.0000 mg | ORAL_TABLET | Freq: Every day | ORAL | Status: DC

## 2012-04-28 NOTE — Telephone Encounter (Signed)
Per CY---call in prednisone 20 mg  #5  1 daily until gone.  Called and spoke with pt and she is aware of med sent to the pharmacy and nothing further is needed.

## 2012-04-28 NOTE — Telephone Encounter (Signed)
msg forwarded to CDY since he is in this office at this time Please advise, thanks!

## 2012-04-28 NOTE — Telephone Encounter (Signed)
lmomtcb x1 

## 2012-04-28 NOTE — Telephone Encounter (Addendum)
Spoke with pt She is c/o PND, prod cough with white, frothy sputum, increased SOB and wheezing x 4 days- seems to be progressively worse. She states having to wake up at night to use neb and using rescue MDI several times during the day. Would like something called in. Has tried sudafed and other otc allergy/sinus meds with little to no relief. Please advise thanks! Allergies  Allergen Reactions  . Latex Rash

## 2012-05-11 ENCOUNTER — Ambulatory Visit: Payer: BC Managed Care – PPO | Admitting: Internal Medicine

## 2012-08-02 ENCOUNTER — Telehealth: Payer: Self-pay | Admitting: Internal Medicine

## 2012-08-02 NOTE — Telephone Encounter (Signed)
Per CY-treatment seems reasonable since pred and Amox just started today, they will need 2-3 days to work. If she is bad then we need to know that and may recommend ER.

## 2012-08-02 NOTE — Telephone Encounter (Signed)
Pt is aware of CY recommendations.

## 2012-08-02 NOTE — Telephone Encounter (Signed)
Last OV 04/01/12 with no future appts pending.  The pt states she went to UC on Sunday morning, 08/01/12,  due to SOB, wheezing, chest tightness and prod cough with yellow mucus. She was given a steroid shot in the office, using her albuterol nebs every 3-4 hours, started on Prednisone 20 mg daily x 5 days and Amoxicillin 500 mg 2 tabs  BID x 10 days. The prednisone and amoxicillin was not started until this morning. She soul like recs from [facility] or an appt. Pls advise. Allergies  Allergen Reactions  . Latex Rash

## 2013-01-19 IMAGING — CR DG CHEST 1V PORT
1 series · 1 of 1 positions shown · non-contrast
Comparison: 04/17/2009

CLINICAL DATA: Shortness of breath

PORTABLE CHEST - 1 VIEW

[AP]
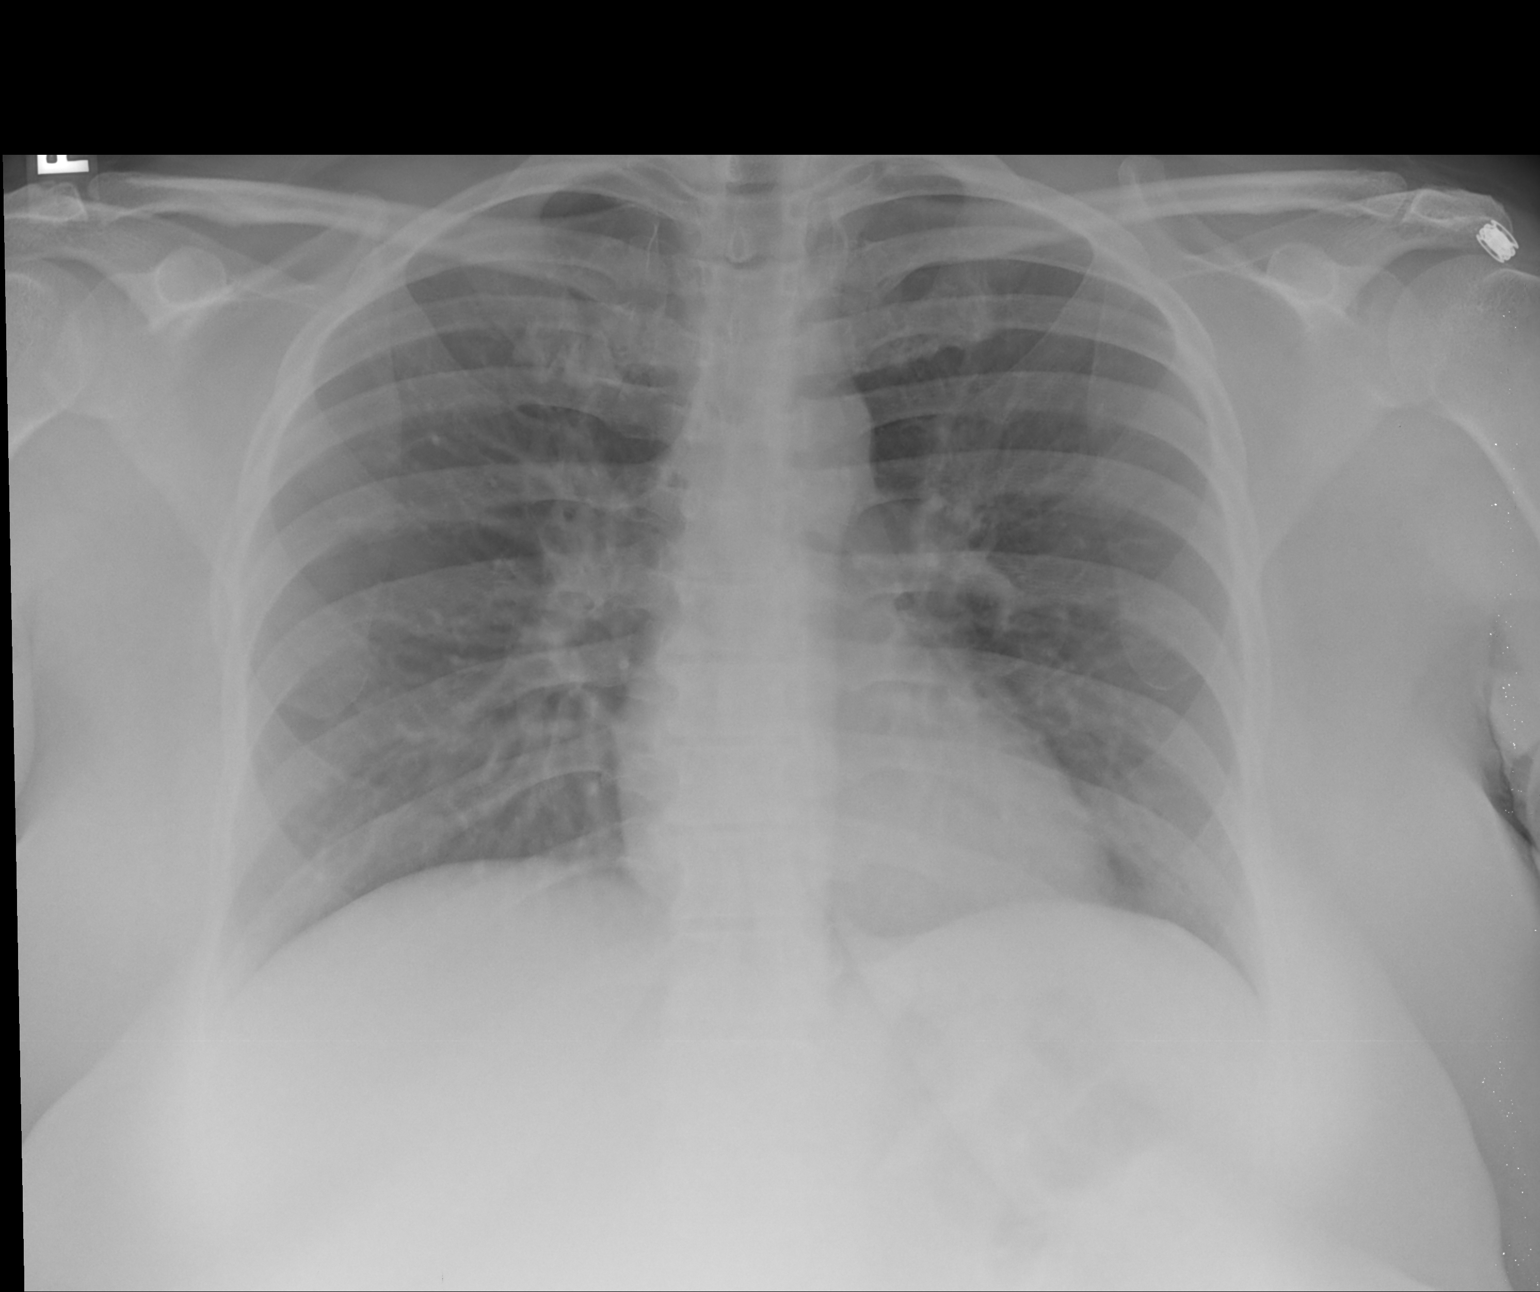

[1 of 1 positions shown; findings below may reference images not displayed]

FINDINGS: The heart size and mediastinal contours are within normal
limits.  Both lungs are clear.  The visualized skeletal structures
are unremarkable.
IMPRESSION: Negative exam.

## 2013-06-04 ENCOUNTER — Other Ambulatory Visit: Payer: Self-pay | Admitting: Internal Medicine

## 2013-06-14 ENCOUNTER — Telehealth: Payer: Self-pay | Admitting: Internal Medicine

## 2013-06-14 MED ORDER — BUDESONIDE-FORMOTEROL FUMARATE 160-4.5 MCG/ACT IN AERO: INHALATION_SPRAY | RESPIRATORY_TRACT | Status: DC

## 2013-06-14 NOTE — Telephone Encounter (Signed)
Pt aware rx has been sent. Nothing further needed 

## 2013-06-15 ENCOUNTER — Encounter: Payer: Self-pay | Admitting: Internal Medicine

## 2013-06-15 ENCOUNTER — Encounter (INDEPENDENT_AMBULATORY_CARE_PROVIDER_SITE_OTHER): Payer: Self-pay

## 2013-06-15 ENCOUNTER — Ambulatory Visit (INDEPENDENT_AMBULATORY_CARE_PROVIDER_SITE_OTHER): Payer: BC Managed Care – PPO | Admitting: Internal Medicine

## 2013-06-15 VITALS — BP 120/80 | HR 82 | Ht 64.0 in | Wt 253.2 lb

## 2013-06-15 DIAGNOSIS — Z23 Encounter for immunization: Secondary | ICD-10-CM

## 2013-06-15 DIAGNOSIS — J309 Allergic rhinitis, unspecified: Secondary | ICD-10-CM

## 2013-06-15 DIAGNOSIS — J45909 Unspecified asthma, uncomplicated: Secondary | ICD-10-CM

## 2013-06-15 MED ORDER — BUDESONIDE-FORMOTEROL FUMARATE 160-4.5 MCG/ACT IN AERO: INHALATION_SPRAY | RESPIRATORY_TRACT | Status: DC

## 2013-06-15 NOTE — Progress Notes (Signed)
Patient ID: Sylvia [name], female    DOB: 06-20-1971, 42 y.o.   MRN: [id number]  HPI 01/20/11- 34 yoF never smoker followed for asthma, allergic rhinitis Last here September 17, 2010- note reviewed She had been doing very well. Last night home was more humid. They monitor humidity levels.  She had been wheezing and coughing some all day, but woke around 2 AM worse. Husband cranked up the dehumidifiers and she took 2 neb treatments 30 minutes apart. After that she calmed down and was able to get back to sleep. Today she is still a little tight.  Over time she has felt she was doing much better, using the combination of symbicort, Qvar and Singulair. Denies recent cold, but daughter had a sinus infection and cough.   04/24/11- [name] never smoker followed for asthma, allergic rhinitis...Marland KitchenMarland Kitchenhere w/ husband and daughter 4 days ago she came down with a cold, shared with her husband. By the next night she had cough and wheezing was using her nebulizer every 8 hours as helps her cough became more productive but makes her shaky. She feels worse lying down and better if she props up to sleep. Some pressure in the right ear and both maxillary sinuses with no purulent discharge and no sore throat.  06/30/11- [name] never smoker followed for asthma, allergic rhinitis Post hospital visit- [facility] viral bronchitis syndrome. Still tapering prednisone. Still productive cough, weak and tired. Supine feels soreness upper anterior chest wall. Did have flu vax. Was not given Tamiflu. While in hospital she had asked restricted access to her records to close she did not want calls from her employer. She feels washed out now after her recent illness as she tapers prednisone, but otherwise well.  12/25/11- [name] never smoker followed for asthma, allergic rhinitis Has noticed she is having stuffiness in nasal area; overall doing well with breathing. She has completely resolved the post viral bronchitis for which she was  hospitalized before our last visit. She is now doing well and feels back to normal. Some nasal congestion blamed on the weather. Still using multiple medications which we reviewed.  04/01/12- [name] never smoker followed for asthma, allergic rhinitis Follow up to hospital stay--states its taking her awhile to "bounce back", wakes up mid night to use rescue inhaler and most mornings  Post hospital visit (8/4 through 02/10/2012)-discharge diagnoses acute exacerbation of asthma Breathing still feels "Croupy" with cough especially at night, post nasal drip, wheeze. This week she is waking early with cough and wheeze. Nebulizer helps. Has never felt clear since hospital. Using rescue inhaler once or twice daily mainly for shortness of breath if she climbs stairs. Educated on wheezing versus exertional shortness of breath FENO- + markedly elevated 109, consistent with active and potentially allergic airway irritation  06/15/13- 42 yoF never smoker followed for asthma, allergic rhinitis FOLLOWS FOR: Pt states she is doing well; denies any wheezing, cough, congestion, or SOB at this time. Uses daily Symbicort once and sometimes twice. Occasional rescue inhaler/Xopenex only if exposed to cigarette smoke etc. We discussed flu and pneumonia vaccines. She complains of waking with nasal congestion from indoor heat. Neti pot helps a little.  Review of Systems-see HPI Constitutional:   No-   weight loss, night sweats, fevers, chills, fatigue, lassitude. HEENT:   No-   headaches, difficulty swallowing, tooth/dental problems, sore throat,                  No-   sneezing,  itching, ear ache,   +nasal congestion, post nasal drip,  CV:  No-   chest pain, orthopnea, PND, swelling in lower extremities, anasarca, dizziness, palpitations GI:  No-   heartburn, indigestion, abdominal pain, nausea, vomiting,  Resp: Still easy shortness of breath with exertion or at rest.  No-  excess mucus,             No-  coughing up of  blood.              No-   change in color of mucus.     Skin: No-   rash or lesions. GU:  MS:  No-   joint pain or swelling.  Marland Kitchen Psych:  No- change in mood or affect. No depression or anxiety.  No memory loss.     Objective:   Physical Exam General- Alert, Oriented, Affect-appropriate, Distress- none acute. obese Skin- rash-none, lesions- none, excoriation- none Lymphadenopathy- none Head- atraumatic            Eyes- Gross vision intact, PERRLA, conjunctivae clear secretions            Ears- Hearing, canals- look normal            Nose- pale edema in the distal right nostril might be the surface of a polyp, No- Septal dev, mucus, polyps, erosion, perforation             Throat- Mallampati III-IV , mucosa clear , drainage- none, tonsils- atrophic.  Neck- flexible , trachea midline, no stridor , thyroid nl, carotid no bruit Chest - symmetrical excursion , unlabored           Heart/CV- RRR , no murmur , no gallop  , no rub, nl s1 s2                           - JVD- none , edema- none, stasis changes- none, varices- none           Lung- clear, wheeze-none , dullness-none, rub- none           Chest wall-  Abd-  Br/ Gen/ Rectal- Not done, not indicated Extrem- cyanosis- none, clubbing, none, atrophy- none, strength- nl Neuro- grossly intact to observation

## 2013-06-15 NOTE — Patient Instructions (Signed)
Symbicort refilled  Flu vax  Try otc saline nasal spray and consider trying Breathe Right Nasal Strips for stuffiness  If you keep having problems with your nose, try otc nasal steroid Nasacort

## 2013-07-07 NOTE — Assessment & Plan Note (Signed)
Mild, intermittent, uncomplicated, controlled. Medications discussed.

## 2013-07-07 NOTE — Assessment & Plan Note (Signed)
Allergic component seasonally. She is describing now sounds more like vasomotor/overdrying. Plan-encouraged adequate hydration, saline nasal spray, Neti pot. Can try Nasacort

## 2013-10-31 IMAGING — CR DG CHEST 2V
2 series · 2 of 2 positions shown · non-contrast
Comparison: 02/08/2012

CLINICAL DATA: Asthma.  Cough.  Shortness of breath.  Hypertension.
Nonsmoker.

CHEST - 2 VIEW

[view not recorded (1 of 2)]
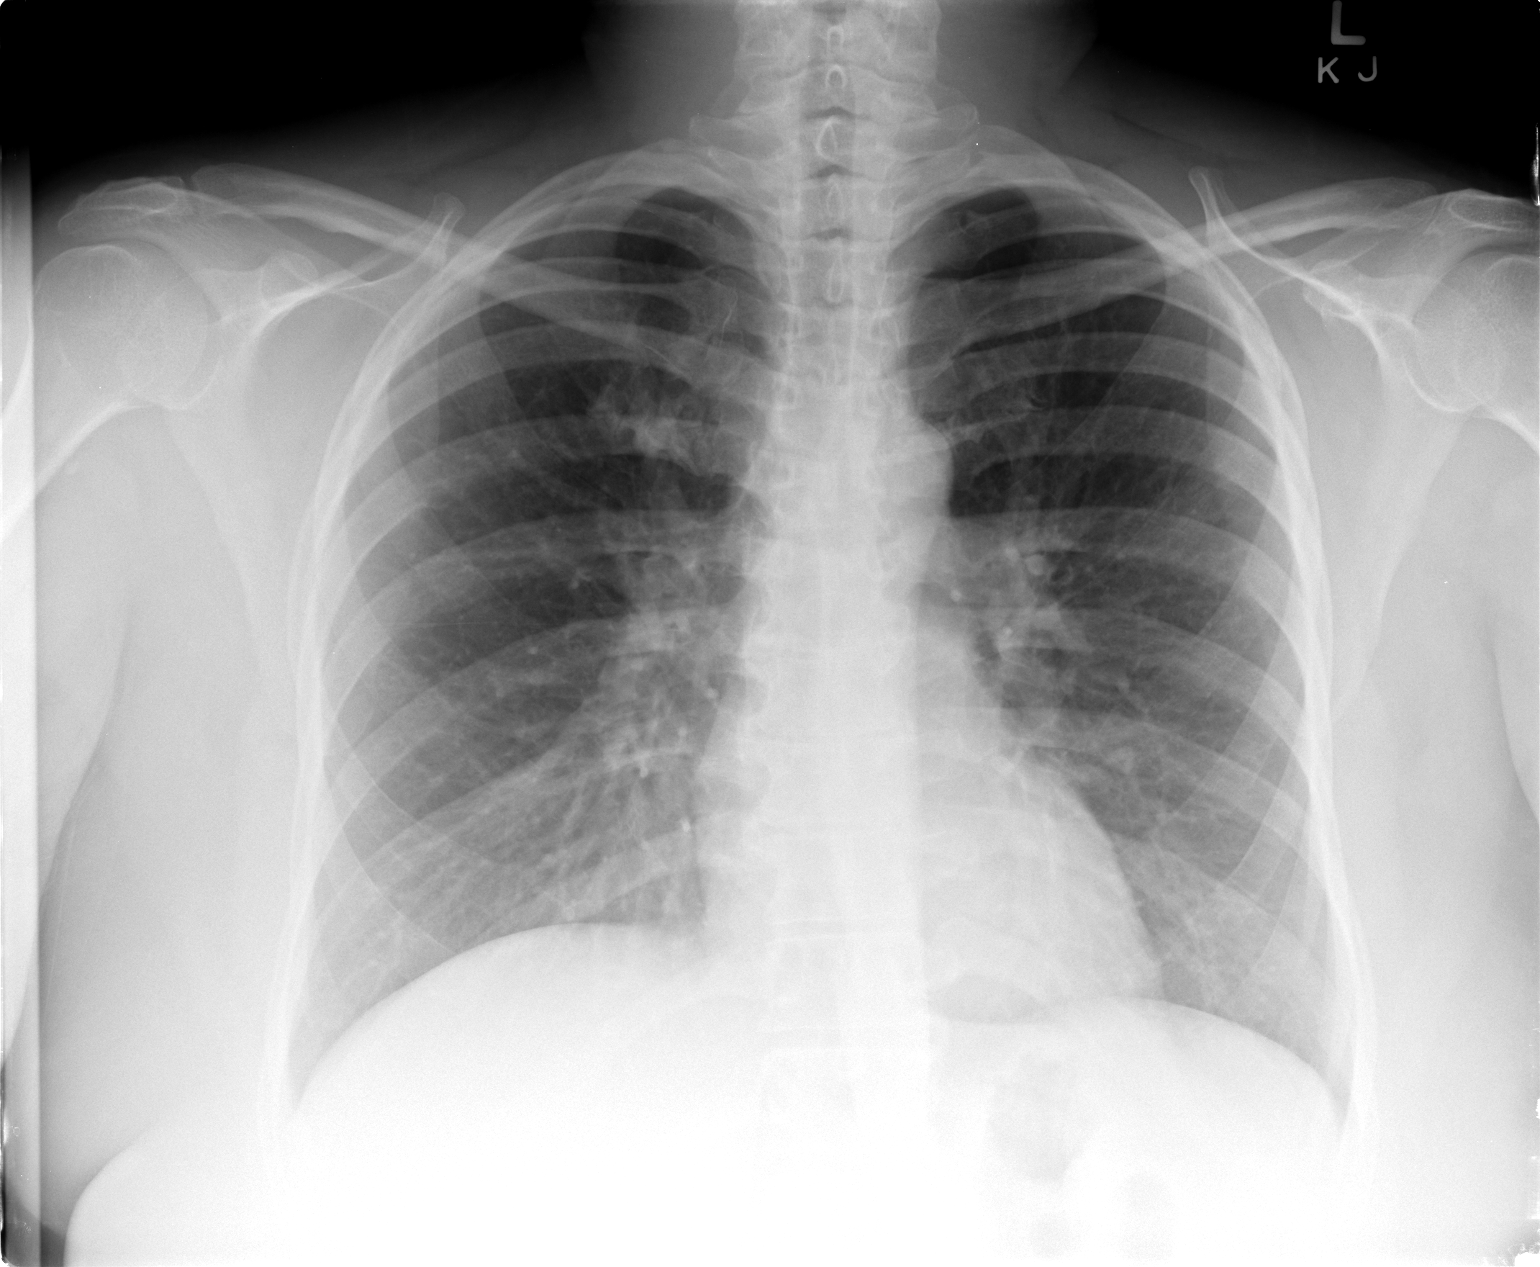

[view not recorded (2 of 2)]
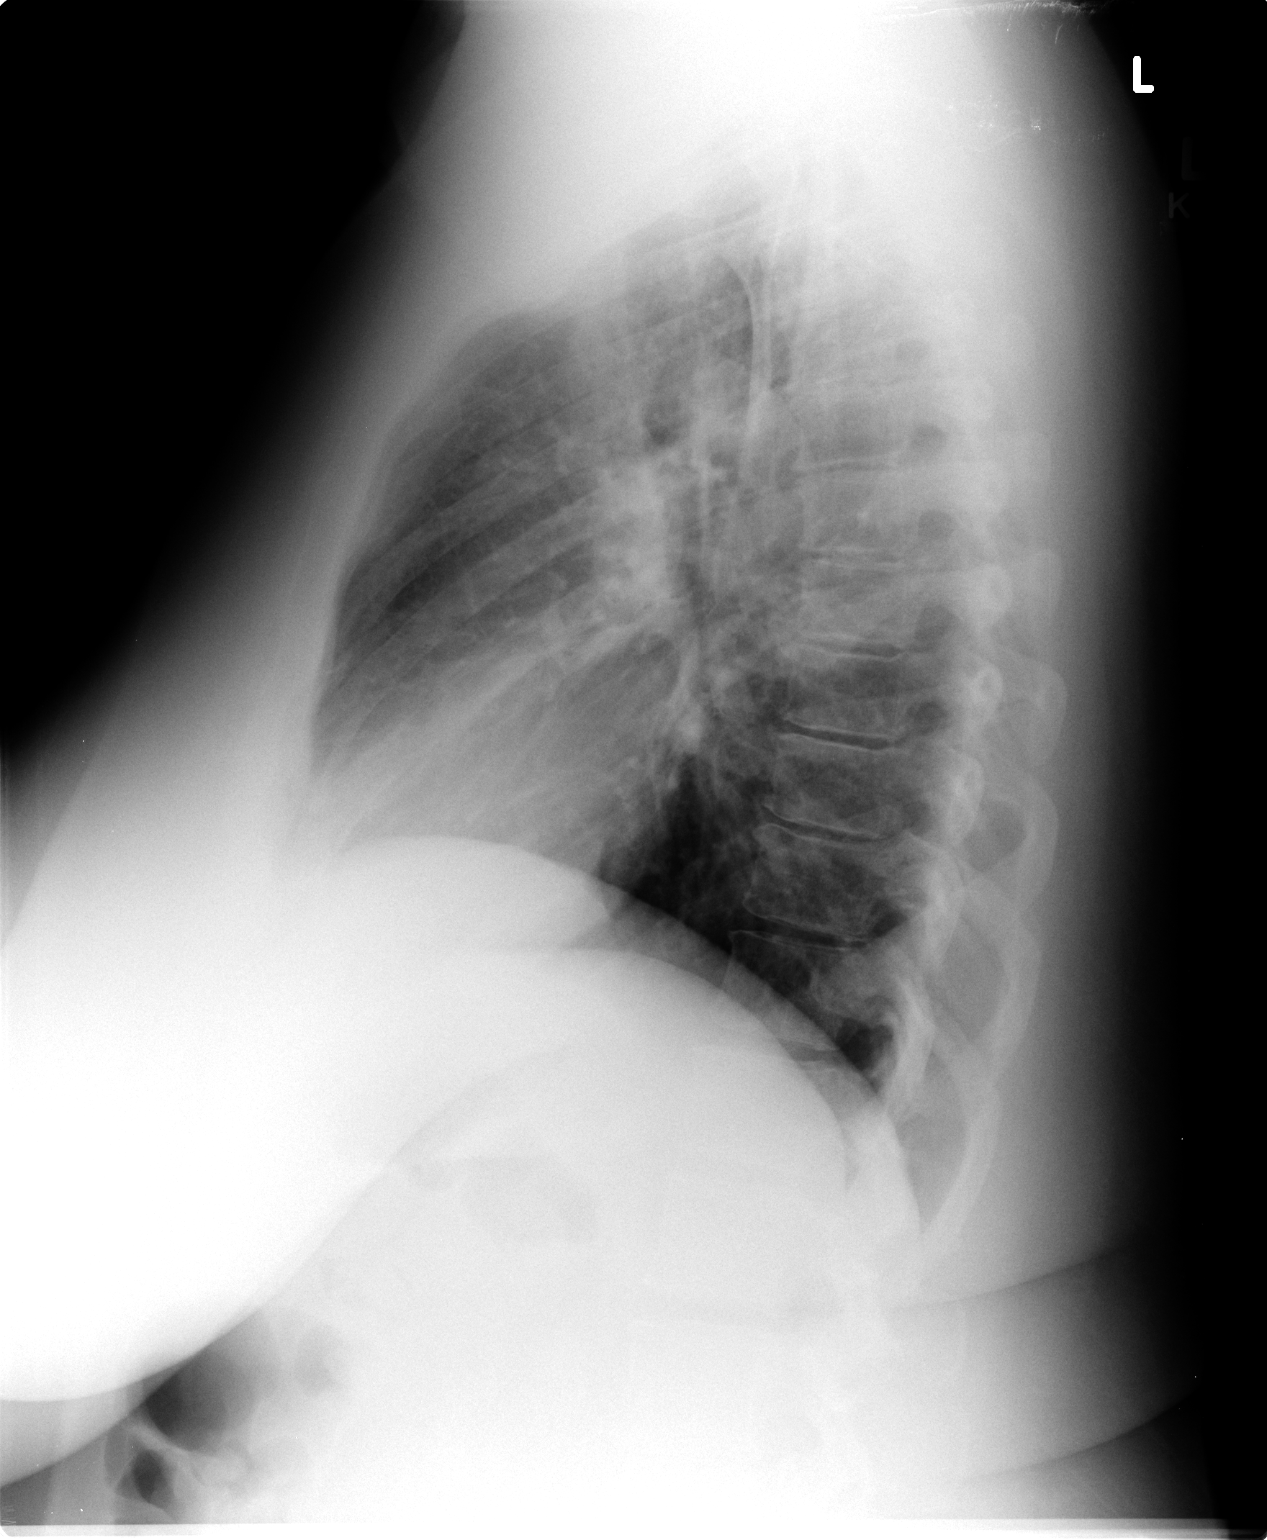

[2 of 2 positions shown; findings below may reference images not displayed]

FINDINGS: Midline trachea.  Normal heart size and mediastinal
contours. No pleural effusion or pneumothorax.  Clear lungs.
IMPRESSION: No acute cardiopulmonary disease.

## 2013-11-21 ENCOUNTER — Telehealth: Payer: Self-pay | Admitting: Internal Medicine

## 2013-11-21 MED ORDER — AMOXICILLIN 500 MG PO CAPS: 500.0000 mg | ORAL_CAPSULE | Freq: Three times a day (TID) | ORAL | Status: DC

## 2013-11-21 NOTE — Telephone Encounter (Signed)
Offer amoxacillin 500 # 21, 1 three times daily

## 2013-11-21 NOTE — Telephone Encounter (Signed)
Called spoke with pt. C/o sinus infection-- sinus HA, nausea, chills, tightness in head, nasal congestion, PND. No cough, no wheezing, no chest tx, no fever. Has tried OTC sinus medication but not helping. Pt wants something called in. Please advise CDY thanks  Allergies  Allergen Reactions  . Latex Rash     Current Outpatient Prescriptions on File Prior to Visit  Medication Sig Dispense Refill  . albuterol (PROVENTIL) (2.5 MG/3ML) 0.083% nebulizer solution Take 2.5 mg by nebulization every 6 (six) hours as needed.        . budesonide-formoterol (SYMBICORT) 160-4.5 MCG/ACT inhaler 2 puffs then rinse mouth, twice daily- maintenance inhaler  1 Inhaler  prn  . cetirizine (ZYRTEC) 10 MG tablet Take 10 mg by mouth daily.        Marland Kitchen EPIPEN 2-PAK 0.3 MG/0.3ML DEVI As directed      . levalbuterol (XOPENEX HFA) 45 MCG/ACT inhaler Inhale 1-2 puffs into the lungs as needed.  1 Inhaler  3  . Multiple Vitamin (MULTIVITAMIN) tablet Take 1 tablet by mouth daily.       No current facility-administered medications on file prior to visit.

## 2013-11-21 NOTE — Telephone Encounter (Signed)
Pt aware of recs. RX sent to the pharm [facility]. Nothing further needed

## 2013-11-23 ENCOUNTER — Telehealth: Payer: Self-pay | Admitting: Internal Medicine

## 2013-11-23 MED ORDER — LEVALBUTEROL TARTRATE 45 MCG/ACT IN AERO: 1.0000 | INHALATION_SPRAY | RESPIRATORY_TRACT | Status: DC | PRN

## 2013-11-23 NOTE — Telephone Encounter (Signed)
Spoke with the pt to verify the pt  Rx for xopenex HFA was sent to pharm  Nothing further needed per pt

## 2014-04-21 ENCOUNTER — Other Ambulatory Visit: Payer: Self-pay

## 2014-05-08 ENCOUNTER — Encounter: Payer: Self-pay | Admitting: Internal Medicine

## 2014-06-15 ENCOUNTER — Ambulatory Visit: Payer: BC Managed Care – PPO | Admitting: Internal Medicine

## 2014-06-22 ENCOUNTER — Ambulatory Visit: Payer: BC Managed Care – PPO | Admitting: Internal Medicine

## 2014-08-07 ENCOUNTER — Ambulatory Visit (INDEPENDENT_AMBULATORY_CARE_PROVIDER_SITE_OTHER): Payer: BC Managed Care – PPO | Admitting: Internal Medicine

## 2014-08-07 ENCOUNTER — Encounter: Payer: Self-pay | Admitting: Internal Medicine

## 2014-08-07 VITALS — BP 116/88 | HR 99 | Ht 64.0 in | Wt 258.0 lb

## 2014-08-07 DIAGNOSIS — J4521 Mild intermittent asthma with (acute) exacerbation: Secondary | ICD-10-CM

## 2014-08-07 DIAGNOSIS — J3089 Other allergic rhinitis: Secondary | ICD-10-CM

## 2014-08-07 DIAGNOSIS — J302 Other seasonal allergic rhinitis: Secondary | ICD-10-CM

## 2014-08-07 DIAGNOSIS — J309 Allergic rhinitis, unspecified: Secondary | ICD-10-CM

## 2014-08-07 MED ORDER — TRIAMCINOLONE ACETONIDE 55 MCG/ACT NA AERO: 2.0000 | INHALATION_SPRAY | Freq: Every day | NASAL | Status: DC

## 2014-08-07 MED ORDER — CETIRIZINE HCL 10 MG PO TABS: 10.0000 mg | ORAL_TABLET | Freq: Every day | ORAL | Status: DC

## 2014-08-07 MED ORDER — METHYLPREDNISOLONE ACETATE 80 MG/ML IJ SUSP
80.0000 mg | Freq: Once | INTRAMUSCULAR | Status: AC
  Administered 2014-08-07: 80 mg via INTRAMUSCULAR

## 2014-08-07 MED ORDER — PHENYLEPHRINE HCL 1 % NA SOLN: 3.0000 [drp] | Freq: Once | NASAL | Status: DC

## 2014-08-07 NOTE — Patient Instructions (Addendum)
Scripts sent refilling nasacort and zyrtec  Neb neo nasal        Dx rhinitis, non-allergic acute  Depo 80

## 2014-08-07 NOTE — Progress Notes (Signed)
Patient ID: Sylvia [name], female    DOB: 1971/03/02, 44 y.o.   MRN: [id number]  HPI 01/20/11- 69 yoF never smoker followed for asthma, allergic rhinitis Last here September 17, 2010- note reviewed She had been doing very well. Last night home was more humid. They monitor humidity levels.  She had been wheezing and coughing some all day, but woke around 2 AM worse. Husband cranked up the dehumidifiers and she took 2 neb treatments 30 minutes apart. After that she calmed down and was able to get back to sleep. Today she is still a little tight.  Over time she has felt she was doing much better, using the combination of symbicort, Qvar and Singulair. Denies recent cold, but daughter had a sinus infection and cough.   04/24/11- [name] never smoker followed for asthma, allergic rhinitis...Marland KitchenMarland Kitchenhere w/ husband and daughter 4 days ago she came down with a cold, shared with her husband. By the next night she had cough and wheezing was using her nebulizer every 8 hours as helps her cough became more productive but makes her shaky. She feels worse lying down and better if she props up to sleep. Some pressure in the right ear and both maxillary sinuses with no purulent discharge and no sore throat.  06/30/11- [name] never smoker followed for asthma, allergic rhinitis Post hospital visit- [facility] viral bronchitis syndrome. Still tapering prednisone. Still productive cough, weak and tired. Supine feels soreness upper anterior chest wall. Did have flu vax. Was not given Tamiflu. While in hospital she had asked restricted access to her records to close she did not want calls from her employer. She feels washed out now after her recent illness as she tapers prednisone, but otherwise well.  12/25/11- 46 yoF never smoker followed for asthma, allergic rhinitis Has noticed she is having stuffiness in nasal area; overall doing well with breathing. She has completely resolved the post viral bronchitis for which she was  hospitalized before our last visit. She is now doing well and feels back to normal. Some nasal congestion blamed on the weather. Still using multiple medications which we reviewed.  04/01/12- 23 yoF never smoker followed for asthma, allergic rhinitis Follow up to hospital stay--states its taking her awhile to "bounce back", wakes up mid night to use rescue inhaler and most mornings  Post hospital visit (8/4 through 02/10/2012)-discharge diagnoses acute exacerbation of asthma Breathing still feels "Croupy" with cough especially at night, post nasal drip, wheeze. This week she is waking early with cough and wheeze. Nebulizer helps. Has never felt clear since hospital. Using rescue inhaler once or twice daily mainly for shortness of breath if she climbs stairs. Educated on wheezing versus exertional shortness of breath FENO- + markedly elevated 109, consistent with active and potentially allergic airway irritation  06/15/13- 42 yoF never smoker followed for asthma, allergic rhinitis FOLLOWS FOR: Pt states she is doing well; denies any wheezing, cough, congestion, or SOB at this time. Uses daily Symbicort once and sometimes twice. Occasional rescue inhaler/Xopenex only if exposed to cigarette smoke etc. We discussed flu and pneumonia vaccines. She complains of waking with nasal congestion from indoor heat. Neti pot helps a little.  08/07/14-42 yoF never smoker followed for asthma, allergic rhinitis FOLLOWS FOR: Pt feels sinus congestion and chest tightness. She had take nebulizer several times this weekend. Pt denies cough and wheezing. Recent nasal congestion and increased shortness of breath, especially at night. Denies cough or wheeze. Nebulizer does seem to help. Continue  Symbicort. Rescue inhaler is not lasting.  Review of Systems-see HPI Constitutional:   No-   weight loss, night sweats, fevers, chills, fatigue, lassitude. HEENT:   No-   headaches, difficulty swallowing, tooth/dental problems, sore  throat,                  No-   sneezing, itching, ear ache,   +nasal congestion, post nasal drip,  CV:  No-   chest pain, orthopnea, PND, swelling in lower extremities, anasarca, dizziness, palpitations GI:  No-   heartburn, indigestion, abdominal pain, nausea, vomiting,  Resp: +easy shortness of breath with exertion or at rest.  No-  excess mucus,             No-  coughing up of blood.              No-   change in color of mucus.     Skin: No-   rash or lesions. GU:  MS:  No-   joint pain or swelling.  Marland Kitchen Psych:  No- change in mood or affect. No depression or anxiety.  No memory loss.     Objective:   Physical Exam General- Alert, Oriented, Affect-appropriate, Distress- none acute.+ obese Skin- rash-none, lesions- none, excoriation- none Lymphadenopathy- none Head- atraumatic            Eyes- Gross vision intact, PERRLA, conjunctivae clear secretions            Ears- Hearing, canals- look normal            Nose- pale edema in the distal right nostril might be the surface of a polyp, No- Septal dev, mucus, polyps, erosion, perforation             Throat- Mallampati III-IV , mucosa clear , drainage- none, tonsils- atrophic.  Neck- flexible , trachea midline, no stridor , thyroid nl, carotid no bruit Chest - symmetrical excursion , unlabored           Heart/CV- RRR , no murmur , no gallop  , no rub, nl s1 s2                           - JVD- none , edema- none, stasis changes- none, varices- none           Lung- clear, wheeze-none , dullness-none, rub- none           Chest wall-  Abd-  Br/ Gen/ Rectal- Not done, not indicated Extrem- cyanosis- none, clubbing, none, atrophy- none, strength- nl Neuro- grossly intact to observation

## 2014-08-17 ENCOUNTER — Telehealth: Payer: Self-pay | Admitting: Internal Medicine

## 2014-08-17 MED ORDER — BUDESONIDE-FORMOTEROL FUMARATE 160-4.5 MCG/ACT IN AERO: INHALATION_SPRAY | RESPIRATORY_TRACT | Status: DC

## 2014-08-17 NOTE — Telephone Encounter (Signed)
Called pt and is aware RX sent in. Nothing further needed

## 2014-08-26 DIAGNOSIS — J302 Other seasonal allergic rhinitis: Secondary | ICD-10-CM | POA: Insufficient documentation

## 2014-08-26 DIAGNOSIS — J3089 Other allergic rhinitis: Secondary | ICD-10-CM

## 2014-08-26 NOTE — Assessment & Plan Note (Signed)
Plan nasal nebulizer decongestant, Depo-Medrol, Nasacort, Zyrtec

## 2014-08-26 NOTE — Assessment & Plan Note (Signed)
Exacerbation of mild persistent asthma Plan-Depo-Medrol

## 2014-10-27 ENCOUNTER — Telehealth: Payer: Self-pay | Admitting: Internal Medicine

## 2014-10-27 MED ORDER — LEVALBUTEROL TARTRATE 45 MCG/ACT IN AERO: 1.0000 | INHALATION_SPRAY | RESPIRATORY_TRACT | Status: DC | PRN

## 2014-10-27 NOTE — Telephone Encounter (Signed)
Pt returned call. Pt requesting refill for Xopenex. Rx sent to preferred pharmacy. Pt verbalized understanding and denied any further questions or concerns at this time.

## 2014-10-27 NOTE — Telephone Encounter (Signed)
lmtcb for pt.  

## 2015-02-27 ENCOUNTER — Telehealth: Payer: Self-pay | Admitting: Internal Medicine

## 2015-02-27 MED ORDER — PREDNISONE 10 MG PO TABS: ORAL_TABLET | ORAL | Status: DC

## 2015-02-27 MED ORDER — AZITHROMYCIN 250 MG PO TABS: ORAL_TABLET | ORAL | Status: DC

## 2015-02-27 NOTE — Telephone Encounter (Signed)
Offer Z pak, and prednisone 10 mg, # 20, 4 X 2 DAYS, 3 X 2 DAYS, 2 X 2 DAYS, 1 X 2 DAYS

## 2015-02-27 NOTE — Telephone Encounter (Signed)
rx sent to pharmacy Patient notified. Nothing further needed.

## 2015-02-27 NOTE — Telephone Encounter (Signed)
Patient says she started getting sick last week, she already had an appointment schedule with Dr. Inda Merlin and Dr. Inda Merlin said she was developing a sinus infection, she was given an antibiotic (Amoxicillin).  She stopped taking it because it made her sick .  She said she still has tightness in her chest, dizzy, headaches, nausea, wheezing. Has been taking neb treatments every 6 hours.  Dr. Inda Merlin gave her some medication for nausea (Promethazine).    [facility] [address]  Allergies  Allergen Reactions  . Latex Rash   Current Outpatient Prescriptions on File Prior to Visit  Medication Sig Dispense Refill  . albuterol (PROVENTIL) (2.5 MG/3ML) 0.083% nebulizer solution Take 2.5 mg by nebulization every 6 (six) hours as needed.      . budesonide-formoterol (SYMBICORT) 160-4.5 MCG/ACT inhaler 2 puffs then rinse mouth, twice daily- maintenance inhaler 1 Inhaler prn  . cetirizine (ZYRTEC) 10 MG tablet Take 1 tablet (10 mg total) by mouth daily. 30 tablet prn  . EPIPEN 2-PAK 0.3 MG/0.3ML DEVI As directed    . levalbuterol (XOPENEX HFA) 45 MCG/ACT inhaler Inhale 1-2 puffs into the lungs as needed. 1 Inhaler 3  . Multiple Vitamin (MULTIVITAMIN) tablet Take 1 tablet by mouth daily.    Marland Kitchen triamcinolone (NASACORT AQ) 55 MCG/ACT AERO nasal inhaler Place 2 sprays into the nose daily. 1 Inhaler 12   No current facility-administered medications on file prior to visit.

## 2015-03-14 ENCOUNTER — Telehealth: Payer: Self-pay | Admitting: *Deleted

## 2015-03-14 NOTE — Telephone Encounter (Signed)
[facility] approved Xopenex HFA from 02/12/15 to 03/13/16.  Ref # [id number]. Pharmacy informed. Nothing further needed.

## 2015-06-13 ENCOUNTER — Ambulatory Visit: Payer: BC Managed Care – PPO

## 2015-06-13 DIAGNOSIS — Z23 Encounter for immunization: Secondary | ICD-10-CM

## 2015-07-19 ENCOUNTER — Other Ambulatory Visit: Payer: Self-pay | Admitting: Internal Medicine

## 2015-08-08 ENCOUNTER — Ambulatory Visit: Payer: BC Managed Care – PPO | Admitting: Internal Medicine

## 2015-08-17 ENCOUNTER — Telehealth: Payer: Self-pay | Admitting: Internal Medicine

## 2015-08-17 NOTE — Telephone Encounter (Signed)
Received call from [place], was placed on hold for over 20 minutes Will call back

## 2015-08-20 NOTE — Telephone Encounter (Signed)
Called pharmacy and they advised me that they faxed the PA for Symbicort. I asked them to re-fax the form because we did not receive it and they advised me to disregard call because patient has another insurance card that will pay for the medications. Nothing further needed.

## 2015-09-12 ENCOUNTER — Other Ambulatory Visit: Payer: Self-pay | Admitting: Internal Medicine

## 2015-10-04 ENCOUNTER — Telehealth: Payer: Self-pay | Admitting: Internal Medicine

## 2015-10-04 MED ORDER — FLUTICASONE FUROATE-VILANTEROL 100-25 MCG/INH IN AEPB: 1.0000 | INHALATION_SPRAY | Freq: Every day | RESPIRATORY_TRACT | Status: DC

## 2015-10-04 MED ORDER — ALBUTEROL SULFATE (2.5 MG/3ML) 0.083% IN NEBU: 2.5000 mg | INHALATION_SOLUTION | Freq: Four times a day (QID) | RESPIRATORY_TRACT | Status: AC | PRN

## 2015-10-04 NOTE — Telephone Encounter (Signed)
lmomtcb x1 She needs to call her insurance to see what covered alternatives are.

## 2015-10-04 NOTE — Telephone Encounter (Signed)
Called and spoke with pt. Reviewed CY's recs [facility] [address]. She voiced understanding and had no further questions. Samples have been placed at the front for pick up. Nothing further needed.

## 2015-10-04 NOTE — Telephone Encounter (Signed)
Pt returning call.Sylvia [name] ° °

## 2015-10-04 NOTE — Telephone Encounter (Signed)
Called spoke with pt. She reports she called her insurance to see what the alternative to symbicort was. She was given 2 options: 1) advair 2) breo ellipta. Pt also requesting refill on albuterol nebulizer. Please advise Dr. Annamaria Boots thanks

## 2015-10-04 NOTE — Telephone Encounter (Signed)
Pt returned call and i let her know that she needed to check with her insurance company to see what the alternatives are for her medication, she said that she would do that.Hillery Hunter

## 2015-10-04 NOTE — Telephone Encounter (Signed)
LMTCB x 1 

## 2015-10-04 NOTE — Telephone Encounter (Signed)
Ok to change Symbicort to [employer] 100- Can come by for sample, pick up Rx and instruction  Inhale 1 puff, once daily, then rinse mouth   Rx Breo 100,   # 1, ref x 12  Ok to refill neb solution x 1 year

## 2016-05-09 ENCOUNTER — Ambulatory Visit (INDEPENDENT_AMBULATORY_CARE_PROVIDER_SITE_OTHER): Payer: BC Managed Care – PPO | Admitting: Internal Medicine

## 2016-05-09 ENCOUNTER — Encounter: Payer: Self-pay | Admitting: Internal Medicine

## 2016-05-09 DIAGNOSIS — J4521 Mild intermittent asthma with (acute) exacerbation: Secondary | ICD-10-CM

## 2016-05-09 DIAGNOSIS — T380X5A Adverse effect of glucocorticoids and synthetic analogues, initial encounter: Secondary | ICD-10-CM

## 2016-05-09 DIAGNOSIS — R739 Hyperglycemia, unspecified: Secondary | ICD-10-CM | POA: Diagnosis not present

## 2016-05-09 DIAGNOSIS — J069 Acute upper respiratory infection, unspecified: Secondary | ICD-10-CM

## 2016-05-09 MED ORDER — METHYLPREDNISOLONE ACETATE 80 MG/ML IJ SUSP
80.0000 mg | Freq: Once | INTRAMUSCULAR | Status: AC
  Administered 2016-05-09: 80 mg via INTRAMUSCULAR

## 2016-05-09 MED ORDER — FLUTICASONE FUROATE-VILANTEROL 200-25 MCG/INH IN AEPB: 1.0000 | INHALATION_SPRAY | Freq: Every day | RESPIRATORY_TRACT | 0 refills | Status: AC

## 2016-05-09 MED ORDER — PHENYLEPHRINE HCL 1 % NA SOLN
3.0000 [drp] | Freq: Once | NASAL | Status: AC
  Administered 2016-05-09: 3 [drp] via NASAL

## 2016-05-09 MED ORDER — AZITHROMYCIN 250 MG PO TABS: ORAL_TABLET | ORAL | 0 refills | Status: DC

## 2016-05-09 NOTE — Assessment & Plan Note (Signed)
She understands glucose will go up from Depo-Medrol, but hopefully not as much as with previous prednisone taper

## 2016-05-09 NOTE — Progress Notes (Signed)
Patient ID: Sylvia [name], female    DOB: 16-Jun-1971, 45 y.o.   MRN: [id number]  Female never smoker followed for asthma, allergic rhinitis FENO 109 H 04/04/12   08/07/14-42 yoF never smoker followed for asthma, allergic rhinitis FOLLOWS FOR: Pt feels sinus congestion and chest tightness. She had take nebulizer several times this weekend. Pt denies cough and wheezing. Recent nasal congestion and increased shortness of breath, especially at night. Denies cough or wheeze. Nebulizer does seem to help. Continue Symbicort. Rescue inhaler is not lasting.  05/09/2016-45 year old female never smoker followed for Asthma, Allergic Rhinitis, complicated by HBP, morbid obesity FOLLOWS FOR: increased SOB, prod cough with dark yellow mucus, hoarseness, tightness in chest, some wheezing, sinus pressure/congestion, PND x2 days.  denies f/c/s, hemoptysis, chest pain She has considered herself very well controlled over the past year using Xopenex HFA occasionally, Breo 100, Nasacort. No sick exposure but in the last few days she has had upper respiratory infection-mainly had congestion, ear pressure, postnasal drip. Increasing cough and wheeze with dark yellow sputum. No distinct sore throat, fever or adenopathy.  Review of Systems-see HPI Constitutional:   No-   weight loss, night sweats, fevers, chills, fatigue, lassitude. HEENT:   No-   headaches, difficulty swallowing, tooth/dental problems, sore throat,                  No-   sneezing, itching, +ear ache,   +nasal congestion, post nasal drip,  CV:  No-   chest pain, orthopnea, PND, swelling in lower extremities, anasarca, dizziness, palpitations GI:  No-   heartburn, indigestion, abdominal pain, nausea, vomiting,  Resp: +easy shortness of breath with exertion or at rest.  + excess mucus,             No-  coughing up of blood.             +  change in color of mucus.     Skin: No-   rash or lesions. GU:  MS:  No-   joint pain or swelling.  Marland Kitchen Psych:   No- change in mood or affect. No depression or anxiety.  No memory loss.     Objective:   Physical Exam General- Alert, Oriented, Affect-appropriate, Distress+ Obviously doesn't feel well.+ obese Skin- rash-none, lesions- none, excoriation- none Lymphadenopathy- none Head- atraumatic            Eyes- Gross vision intact, PERRLA, conjunctivae clear secretions            Ears- Hearing, canals- look normal            Nose- + turbinate edema No- Septal dev, mucus, polyps, erosion, perforation             Throat- Mallampati III-IV , mucosa clear , drainage- none, tonsils- atrophic.  Neck- flexible , trachea midline, no stridor , thyroid nl, carotid no bruit Chest - symmetrical excursion , unlabored           Heart/CV- RRR , no murmur , no gallop  , no rub, nl s1 s2                           - JVD- none , edema- none, stasis changes- none, varices- none           Lung- clear, wheeze-none , dullness-none, rub- none, + active cough           Chest wall-  Abd-  Br/ Gen/ Rectal-  Not done, not indicated Extrem- cyanosis- none, clubbing, none, atrophy- none, strength- nl Neuro- grossly intact to observation

## 2016-05-09 NOTE — Addendum Note (Signed)
Addended by: Parke Poisson E on: 05/09/2016 12:11 PM   Modules accepted: Orders

## 2016-05-09 NOTE — Assessment & Plan Note (Signed)
Upper respiratory infection progressing typical pattern towards asthmatic bronchitis. Plan-fluids and symptomatic control, see pack, Depo-Medrol with steroid/glucose discussion

## 2016-05-09 NOTE — Patient Instructions (Signed)
Script sent for Z pak  Sample Breo 200   Inhale 1 puff then rinse mouth, once daily. When the sample runs out, go back to your Breo 100.  Depo 80     Dx asthma exacerbation  Neb nasal neo    Dx acute rhinitis  Stay well hydrated. Ok to use Sudafed and otc cough and cold remedies like Mucinex-DM if they help you.  Please call as needed

## 2016-05-09 NOTE — Assessment & Plan Note (Signed)
Uncomfortable nasal congestion and eustachian dysfunction with postnasal drainage Plan-symptomatic therapy as needed. Nasal  decongestant nebulizer treatment

## 2016-08-08 ENCOUNTER — Other Ambulatory Visit: Payer: Self-pay | Admitting: Internal Medicine

## 2016-10-30 ENCOUNTER — Other Ambulatory Visit: Payer: Self-pay | Admitting: Internal Medicine

## 2017-09-27 ENCOUNTER — Other Ambulatory Visit: Payer: Self-pay | Admitting: Internal Medicine

## 2018-04-20 ENCOUNTER — Other Ambulatory Visit: Payer: Self-pay | Admitting: Obstetrics and Gynecology

## 2018-05-18 ENCOUNTER — Other Ambulatory Visit: Payer: Self-pay | Admitting: Obstetrics and Gynecology

## 2018-05-24 ENCOUNTER — Encounter (HOSPITAL_BASED_OUTPATIENT_CLINIC_OR_DEPARTMENT_OTHER): Payer: Self-pay | Admitting: *Deleted

## 2018-05-28 DIAGNOSIS — D259 Leiomyoma of uterus, unspecified: Secondary | ICD-10-CM | POA: Insufficient documentation

## 2018-06-09 ENCOUNTER — Encounter (HOSPITAL_COMMUNITY): Payer: Self-pay

## 2018-06-09 NOTE — Patient Instructions (Addendum)
Your procedure is scheduled on: Thursday, Dec. 12, 2019   Surgery Time:  7:30AM-11:14AM   Report to [facility] Main  Entrance    Report to admitting at 5:30 AM   Call this number if you have problems the morning of surgery [phone]   Do not eat food or drink liquids :After Midnight.   Brush your teeth the morning of surgery.   Do NOT smoke after Midnight   Take these medicines the morning of surgery with A SIP OF WATER: Amlodipine   Use inhaler per normal routine   Bring Asthma Inhaler day of surgery                               You may not have any metal on your body including hair pins, jewelry, and body piercings             Do not wear make-up, lotions, powders, perfumes/cologne, or deodorant             Do not wear nail polish.  Do not shave  48 hours prior to surgery.                Do not bring valuables to the hospital. [place].   Contacts, dentures or bridgework may not be worn into surgery.   Leave suitcase in the car. After surgery it may be brought to your room.    Patients discharged the day of surgery will not be allowed to drive home.   Special Instructions: Bring a copy of your healthcare power of attorney and living will documents         the day of surgery if you haven't scanned them in before.              Please read over the following fact sheets you were given:  [facility] - Preparing for Surgery Before surgery, you can play an important role.  Because skin is not sterile, your skin needs to be as free of germs as possible.  You can reduce the number of germs on your skin by washing with CHG (chlorahexidine gluconate) soap before surgery.  CHG is an antiseptic cleaner which kills germs and bonds with the skin to continue killing germs even after washing. Please DO NOT use if you have an allergy to CHG or antibacterial soaps.  If your skin becomes reddened/irritated stop using the  CHG and inform your nurse when you arrive at Short Stay. Do not shave (including legs and underarms) for at least 48 hours prior to the first CHG shower.  You may shave your face/neck.  Please follow these instructions carefully:  1.  Shower with CHG Soap the night before surgery and the  morning of surgery.  2.  If you choose to wash your hair, wash your hair first as usual with your normal  shampoo.  3.  After you shampoo, rinse your hair and body thoroughly to remove the shampoo.                             4.  Use CHG as you would any other liquid soap.  You can apply chg directly to the skin and wash.  Gently with a scrungie or clean washcloth.  5.  Apply the CHG Soap to your body ONLY FROM THE NECK DOWN.   Do   not use on face/ open                           Wound or open sores. Avoid contact with eyes, ears mouth and   genitals (private parts).                       Wash face,  Genitals (private parts) with your normal soap.             6.  Wash thoroughly, paying special attention to the area where your    surgery  will be performed.  7.  Thoroughly rinse your body with warm water from the neck down.  8.  DO NOT shower/wash with your normal soap after using and rinsing off the CHG Soap.                9.  Pat yourself dry with a clean towel.            10.  Wear clean pajamas.            11.  Place clean sheets on your bed the night of your first shower and do not  sleep with pets. Day of Surgery : Do not apply any lotions/deodorants the morning of surgery.  Please wear clean clothes to the hospital/surgery center.  FAILURE TO FOLLOW THESE INSTRUCTIONS MAY RESULT IN THE CANCELLATION OF YOUR SURGERY  PATIENT SIGNATURE_________________________________  NURSE SIGNATURE__________________________________  ________________________________________________________________________

## 2018-06-10 NOTE — H&P (Addendum)
Sylvia [name] is a 47 y.o.  P: 1-0-0-1 who  presents for hysterectomy because of symptomatic uterine fibroids, menorrhagia and dysmenorrhea. Over the past 4 months the patient has had increasingly painful periods accompanied by a  4 day flow that includes large clots and the need to change her pad every 2 hours. Though she rates her menstrual cramps as a 10/10 on a 10 point pain scale she does find relief, most of the time,   with Ibuprofen 800 mg.  In spite of this,  she has had to miss work at times,  due to her intolerable cramping  The pain is primarily felt on the left side and is achy with waves of intense cramping.  She goes on to report that throughout the month,  she may have pelvic pains that radiate to her rectum  and left thigh,  and  will also occur at times when she voids. She further notes dyspareunia and occasional pain with defecation. An endometrial biopsy in October 2019 returned benigh cells with no hyperplasia, malignancy or atypia. Both her prolactin and TSH were normal and hemoglobin was 12.4.  A pelvic ultrasound at that same time showed:  an anteverted uterus: (10.1 cm from fundus to external os) 6.90 x 5.26 x 6.08 cm, endometrium: 8.70 mm with #4 intramural fibroids: posterior LUS-1.73 cm, posterior fundal-2.10 cm, anterior with submucosal component-1.04 cm and left 1.61 cm; right ovary-2.85 cm and left ovary-3.59 cm with a 3.0 cm complex cyst favoring a hemorrhagic cyst.  A review of both medical and surgical management options were given to the patient however,  she desires definitive therapy in the form of hysterectomy.  Past Medical History  OB History: G: 1;   P: 1-0-0-1;  C-section 2006   GYN History: menarche: 47 YO    LMP : 05/24/18   Contraception: condoms   Denies history of abnormal PAP smear or STDs.   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 Latex Rash  Peanuts: respiratory distress and hives  Denies sensitivity to shellfish, soy,  or adhesives.  ROS: Admits to glasses/contact lenses, pelvic pressure, discomfort with urination  and occasional sinus congestion but  denies headache, vision changes, dysphagia, tinnitus, dizziness, hoarseness, cough,  chest pain, shortness of breath, nausea, vomiting, diarrhea, constipation,  urinary frequency,  hematuria, vaginitis symptoms,  swelling of joints,easy bruising,  myalgias, arthralgias, skin rashes, unexplained weight loss and except as is mentioned in the history of present illness, patient's review of systems is otherwise negative.     Physical Exam  Bp: 126/50  P: 95 bpm  Temperature: 99 degrees F orally  O2Sat. 99 % (room air)  Weight: 268 lbs. Height: 5\' 4"   BMI: 46  Neck: supple without masses or thyromegaly Lungs: clear to auscultation Heart: regular rate and rhythm Abdomen: soft, non-tender and no organomegaly Pelvic:EGBUS- wnl; vagina-normal rugae; uterus-  enlarged 8-10 weeks size, cervix without lesions or motion tenderness; adnexae-no tenderness or masses Extremities:  no clubbing, cyanosis or edema   Assesment:  Symptomatic Uterine Fibroids                        Menorrhagia                        Dyspareunia   Disposition: Robot-Assisted Total Laparoscopic Hysterectomy was reviewed with the patient.   Benefits of the robotic approach include lesser postoperative pain,  less blood loss during surgery, reduced risk of injury to other organs due to better visualization with a 3-D HD 10 times magnifying camera, shorter hospital stay between 0-1 night and rapid recovery with return to daily routine in 2-3 weeks. Although robotically-assisted hysterectomy has a longer operative time than traditional laparotomy, in a patient with good medical history, the benefits usually outweigh the risks.   Risks include bleeding, infection, injury to other organs, need for laparotomy, possible need to leave a cervical stump, transient post-operative facial edema, increased risk of pelvic prolapse associated with any hysterectomy as well as earlier onset of menopause. Preservation or preventative removal of the ovaries was also reviewed and left to the patient's discretion. Finally, the option of supracervical hysterectomy was also discussed with the possible but yet unconfirmed benefits of reduction of pelvic prolapse. If supracervical hysterectomy is performed, Pap smear screening would continue as currently recommended, monthly bleeding is possible despite cauterization of cervical canal and a small but possible risk of cervical fibroid development is present.  Patient informed about FDA warning on use of morcellator dated 10/21/2012.   Discussed:  1. Incidence of post-operative diagnosis of sarcoma in women undergoing a hysterectomy is 2:1000 2. Annual incidence of leiomyosarcoma is 0.64/100,000 women 3. Sarcomas have the highest incidence in women over 65 4. Power morcellation involves risks of spreading tissue / disease. In he case of undiagnosed cancer, it may adversely affect the patient's prognosis.  Patient verbalized  understanding of these risks and desires to proceed  with a Robot Assisted Total Laparoscopically Hysterectomy, Bilateral Salpingectomy and Possible Uterine Morcellation at [facility] on June 17, 2018. A Miralax bowel prep was given to be completed  the day prior to her surgery.  CSN# [id number]   Asante Ritacco J. Florene Glen, PA-C  for Dr.Sandra A.Rivard

## 2018-06-11 ENCOUNTER — Encounter (HOSPITAL_COMMUNITY): Payer: Self-pay

## 2018-06-11 ENCOUNTER — Encounter (HOSPITAL_COMMUNITY)
Admission: RE | Admit: 2018-06-11 | Discharge: 2018-06-11 | Disposition: A | Discharge status: Home / Self Care | Payer: BC Managed Care – PPO | Source: Ambulatory Visit | Attending: Obstetrics and Gynecology | Admitting: Obstetrics and Gynecology

## 2018-06-11 ENCOUNTER — Emergency Department (HOSPITAL_COMMUNITY)
Admission: EM | Admit: 2018-06-11 | Discharge: 2018-06-11 | Discharge status: Absent without leave | Payer: BC Managed Care – PPO

## 2018-06-11 ENCOUNTER — Other Ambulatory Visit: Payer: Self-pay

## 2018-06-11 DIAGNOSIS — D259 Leiomyoma of uterus, unspecified: Secondary | ICD-10-CM | POA: Insufficient documentation

## 2018-06-11 DIAGNOSIS — Z01818 Encounter for other preprocedural examination: Secondary | ICD-10-CM | POA: Insufficient documentation

## 2018-06-11 HISTORY — DX: Gastro-esophageal reflux disease without esophagitis: K21.9

## 2018-06-11 HISTORY — DX: Other fracture of unspecified lower leg, initial encounter for closed fracture: S82.899A

## 2018-06-11 HISTORY — DX: Depression, unspecified: F32.A

## 2018-06-11 HISTORY — DX: Pneumonia, unspecified organism: J18.9

## 2018-06-11 HISTORY — DX: Anxiety disorder, unspecified: F41.9

## 2018-06-11 HISTORY — DX: Major depressive disorder, single episode, unspecified: F32.9

## 2018-06-11 HISTORY — DX: Unspecified osteoarthritis, unspecified site: M19.90

## 2018-06-11 HISTORY — DX: Migraine, unspecified, not intractable, without status migrainosus: G43.909

## 2018-06-11 LAB — CBC
HCT: 39.9 % (ref 36.0–46.0)
Hemoglobin: 12.3 g/dL (ref 12.0–15.0)
MCH: 28.9 pg (ref 26.0–34.0)
MCHC: 30.8 g/dL (ref 30.0–36.0)
MCV: 93.7 fL (ref 80.0–100.0)
Platelets: 325 10*3/uL (ref 150–400)
RBC: 4.26 MIL/uL (ref 3.87–5.11)
RDW: 14.4 % (ref 11.5–15.5)
WBC: 11.8 10*3/uL — ABNORMAL HIGH (ref 4.0–10.5)
nRBC: 0 % (ref 0.0–0.2)

## 2018-06-11 LAB — BASIC METABOLIC PANEL
Anion gap: 7 (ref 5–15)
BUN: 12 mg/dL (ref 6–20)
CO2: 27 mmol/L (ref 22–32)
Calcium: 8.8 mg/dL — ABNORMAL LOW (ref 8.9–10.3)
Chloride: 106 mmol/L (ref 98–111)
Creatinine, Ser: 0.71 mg/dL (ref 0.44–1.00)
GFR calc Af Amer: >60 mL/min (ref >60)
GFR calc non Af Amer: >60 mL/min (ref >60)
Glucose, Bld: 102 mg/dL — ABNORMAL HIGH (ref 70–99)
Potassium: 3.9 mmol/L (ref 3.5–5.1)
Sodium: 140 mmol/L (ref 135–145)

## 2018-06-11 NOTE — Pre-Procedure Instructions (Signed)
CBC and BMP results 06/11/2018 sent to Dr. Cletis Media via epic.

## 2018-06-16 NOTE — Anesthesia Preprocedure Evaluation (Addendum)
Anesthesia Evaluation  Patient identified by MRN, date of birth, ID band Patient awake    Reviewed: Allergy & Precautions, NPO status , Patient's Chart, lab work & pertinent test results  Airway Mallampati: III  TM Distance: >3 FB Neck ROM: Full    Dental  (+) Dental Advisory Given   Pulmonary asthma ,    breath sounds clear to auscultation       Cardiovascular hypertension, Pt. on medications  Rhythm:Regular Rate:Normal     Neuro/Psych  Headaches, Anxiety Depression    GI/Hepatic Neg liver ROS, GERD  ,  Endo/Other  Morbid obesity  Renal/GU negative Renal ROS     Musculoskeletal  (+) Arthritis ,   Abdominal   Peds  Hematology negative hematology ROS (+)   Anesthesia Other Findings   Reproductive/Obstetrics                            Lab Results  Component Value Date   WBC 11.8 (H) 06/11/2018   HGB 12.3 06/11/2018   HCT 39.9 06/11/2018   MCV 93.7 06/11/2018   PLT 325 06/11/2018   Lab Results  Component Value Date   CREATININE 0.71 06/11/2018   BUN 12 06/11/2018   NA 140 06/11/2018   K 3.9 06/11/2018   CL 106 06/11/2018   CO2 27 06/11/2018    Anesthesia Physical Anesthesia Plan  ASA: III  Anesthesia Plan: General   Post-op Pain Management:    Induction: Intravenous  PONV Risk Score and Plan: 3 and Midazolam, Dexamethasone, Ondansetron and Treatment may vary due to age or medical condition  Airway Management Planned: Oral ETT  Additional Equipment:   Intra-op Plan:   Post-operative Plan: Extubation in OR  Informed Consent: I have reviewed the patients History and Physical, chart, labs and discussed the procedure including the risks, benefits and alternatives for the proposed anesthesia with the patient or authorized representative who has indicated his/her understanding and acceptance.   Dental advisory given  Plan Discussed with: CRNA  Anesthesia Plan  Comments:        Anesthesia Quick Evaluation

## 2018-06-17 ENCOUNTER — Ambulatory Visit (HOSPITAL_BASED_OUTPATIENT_CLINIC_OR_DEPARTMENT_OTHER)
Admission: RE | Admit: 2018-06-17 | Discharge: 2018-06-18 | Disposition: A | Discharge status: Home / Self Care | Payer: BC Managed Care – PPO | Source: Ambulatory Visit | Attending: Obstetrics and Gynecology | Admitting: Obstetrics and Gynecology

## 2018-06-17 ENCOUNTER — Encounter (HOSPITAL_COMMUNITY)
Admission: RE | Disposition: A | Discharge status: Home / Self Care | Payer: Self-pay | Source: Ambulatory Visit | Attending: Obstetrics and Gynecology

## 2018-06-17 ENCOUNTER — Encounter (HOSPITAL_COMMUNITY): Payer: Self-pay | Admitting: *Deleted

## 2018-06-17 ENCOUNTER — Ambulatory Visit (HOSPITAL_BASED_OUTPATIENT_CLINIC_OR_DEPARTMENT_OTHER): Payer: BC Managed Care – PPO | Admitting: Anesthesiology

## 2018-06-17 DIAGNOSIS — N803 Endometriosis of pelvic peritoneum: Secondary | ICD-10-CM

## 2018-06-17 DIAGNOSIS — N8302 Follicular cyst of left ovary: Secondary | ICD-10-CM | POA: Insufficient documentation

## 2018-06-17 DIAGNOSIS — D252 Subserosal leiomyoma of uterus: Secondary | ICD-10-CM

## 2018-06-17 DIAGNOSIS — Z7951 Long term (current) use of inhaled steroids: Secondary | ICD-10-CM | POA: Diagnosis not present

## 2018-06-17 DIAGNOSIS — N946 Dysmenorrhea, unspecified: Secondary | ICD-10-CM

## 2018-06-17 DIAGNOSIS — IMO0002 Reserved for concepts with insufficient information to code with codable children: Secondary | ICD-10-CM

## 2018-06-17 DIAGNOSIS — N72 Inflammatory disease of cervix uteri: Secondary | ICD-10-CM | POA: Insufficient documentation

## 2018-06-17 DIAGNOSIS — N736 Female pelvic peritoneal adhesions (postinfective): Secondary | ICD-10-CM | POA: Diagnosis not present

## 2018-06-17 DIAGNOSIS — D259 Leiomyoma of uterus, unspecified: Secondary | ICD-10-CM | POA: Diagnosis not present

## 2018-06-17 DIAGNOSIS — Z6841 Body Mass Index (BMI) 40.0 and over, adult: Secondary | ICD-10-CM | POA: Diagnosis not present

## 2018-06-17 DIAGNOSIS — D251 Intramural leiomyoma of uterus: Secondary | ICD-10-CM

## 2018-06-17 DIAGNOSIS — I10 Essential (primary) hypertension: Secondary | ICD-10-CM | POA: Diagnosis not present

## 2018-06-17 DIAGNOSIS — Z79899 Other long term (current) drug therapy: Secondary | ICD-10-CM | POA: Diagnosis not present

## 2018-06-17 DIAGNOSIS — J45909 Unspecified asthma, uncomplicated: Secondary | ICD-10-CM | POA: Insufficient documentation

## 2018-06-17 DIAGNOSIS — N9419 Other specified dyspareunia: Secondary | ICD-10-CM

## 2018-06-17 DIAGNOSIS — N802 Endometriosis of fallopian tube: Secondary | ICD-10-CM | POA: Insufficient documentation

## 2018-06-17 DIAGNOSIS — N920 Excessive and frequent menstruation with regular cycle: Secondary | ICD-10-CM

## 2018-06-17 HISTORY — PX: ROBOTIC ASSISTED LAPAROSCOPIC LYSIS OF ADHESION: SHX6080

## 2018-06-17 HISTORY — PX: CYSTOSCOPY: SHX5120

## 2018-06-17 HISTORY — PX: ROBOTIC ASSISTED BILATERAL SALPINGO OOPHERECTOMY: SHX6078

## 2018-06-17 LAB — PREGNANCY, URINE: Preg Test, Ur: NEGATIVE

## 2018-06-17 SURGERY — SALPINGO-OOPHORECTOMY, BILATERAL, ROBOT-ASSISTED
Anesthesia: General

## 2018-06-17 MED ORDER — FENTANYL CITRATE (PF) 100 MCG/2ML IJ SOLN
25.0000 ug | INTRAMUSCULAR | Status: DC | PRN
  Administered 2018-06-17: 50 ug via INTRAVENOUS

## 2018-06-17 MED ORDER — FENTANYL CITRATE (PF) 100 MCG/2ML IJ SOLN
INTRAMUSCULAR | Status: AC
  Filled 2018-06-17: qty 2

## 2018-06-17 MED ORDER — FENTANYL CITRATE (PF) 250 MCG/5ML IJ SOLN
INTRAMUSCULAR | Status: AC
  Filled 2018-06-17: qty 5

## 2018-06-17 MED ORDER — KETOROLAC TROMETHAMINE 30 MG/ML IJ SOLN
INTRAMUSCULAR | Status: DC | PRN
  Administered 2018-06-17: 30 mg via INTRAVENOUS

## 2018-06-17 MED ORDER — MIDAZOLAM HCL 5 MG/5ML IJ SOLN
INTRAMUSCULAR | Status: DC | PRN
  Administered 2018-06-17: 2 mg via INTRAVENOUS

## 2018-06-17 MED ORDER — KETOROLAC TROMETHAMINE 30 MG/ML IJ SOLN
INTRAMUSCULAR | Status: AC
  Filled 2018-06-17: qty 1

## 2018-06-17 MED ORDER — SODIUM CHLORIDE 0.9 % IR SOLN
Status: DC | PRN
  Administered 2018-06-17: 3000 mL

## 2018-06-17 MED ORDER — SUGAMMADEX SODIUM 200 MG/2ML IV SOLN
INTRAVENOUS | Status: DC | PRN
  Administered 2018-06-17: 200 mg via INTRAVENOUS

## 2018-06-17 MED ORDER — AMLODIPINE BESYLATE 10 MG PO TABS: 10.0000 mg | ORAL_TABLET | Freq: Every day | ORAL | Status: DC

## 2018-06-17 MED ORDER — LACTATED RINGERS IV SOLN
INTRAVENOUS | Status: DC
  Administered 2018-06-17 (×2): via INTRAVENOUS

## 2018-06-17 MED ORDER — KETOROLAC TROMETHAMINE 30 MG/ML IJ SOLN
30.0000 mg | Freq: Once | INTRAMUSCULAR | Status: AC
  Administered 2018-06-17: 30 mg via INTRAVENOUS
  Filled 2018-06-17: qty 1

## 2018-06-17 MED ORDER — ROCURONIUM BROMIDE 10 MG/ML (PF) SYRINGE
PREFILLED_SYRINGE | INTRAVENOUS | Status: AC
  Filled 2018-06-17: qty 20

## 2018-06-17 MED ORDER — DEXAMETHASONE SODIUM PHOSPHATE 10 MG/ML IJ SOLN
INTRAMUSCULAR | Status: DC | PRN
  Administered 2018-06-17: 5 mg via INTRAVENOUS

## 2018-06-17 MED ORDER — HYDROMORPHONE HCL 2 MG/ML IJ SOLN
INTRAMUSCULAR | Status: AC
  Filled 2018-06-17: qty 1

## 2018-06-17 MED ORDER — LACTATED RINGERS IV SOLN
INTRAVENOUS | Status: DC
  Administered 2018-06-17 – 2018-06-18 (×2): via INTRAVENOUS

## 2018-06-17 MED ORDER — MIDAZOLAM HCL 2 MG/2ML IJ SOLN
INTRAMUSCULAR | Status: AC
  Filled 2018-06-17: qty 2

## 2018-06-17 MED ORDER — OXYCODONE-ACETAMINOPHEN 5-325 MG PO TABS: 1.0000 | ORAL_TABLET | Freq: Four times a day (QID) | ORAL | 0 refills | Status: AC | PRN

## 2018-06-17 MED ORDER — MENTHOL 3 MG MT LOZG: 1.0000 | LOZENGE | OROMUCOSAL | Status: DC | PRN

## 2018-06-17 MED ORDER — ONDANSETRON HCL 4 MG/2ML IJ SOLN: 4.0000 mg | Freq: Once | INTRAMUSCULAR | Status: DC | PRN

## 2018-06-17 MED ORDER — STERILE WATER FOR IRRIGATION IR SOLN
Status: DC | PRN
  Administered 2018-06-17: 1000 mL via INTRAVESICAL

## 2018-06-17 MED ORDER — LIDOCAINE HCL (CARDIAC) PF 100 MG/5ML IV SOSY
PREFILLED_SYRINGE | INTRAVENOUS | Status: DC | PRN
  Administered 2018-06-17: 80 mg via INTRAVENOUS

## 2018-06-17 MED ORDER — IBUPROFEN 600 MG PO TABS: ORAL_TABLET | ORAL | 1 refills | Status: DC

## 2018-06-17 MED ORDER — CEFAZOLIN SODIUM-DEXTROSE 2-4 GM/100ML-% IV SOLN
2.0000 g | INTRAVENOUS | Status: AC
  Administered 2018-06-17: 2 g via INTRAVENOUS
  Filled 2018-06-17: qty 100

## 2018-06-17 MED ORDER — ONDANSETRON HCL 4 MG/2ML IJ SOLN
INTRAMUSCULAR | Status: DC | PRN
  Administered 2018-06-17: 4 mg via INTRAVENOUS

## 2018-06-17 MED ORDER — PROPOFOL 10 MG/ML IV BOLUS
INTRAVENOUS | Status: AC
  Filled 2018-06-17: qty 20

## 2018-06-17 MED ORDER — ONDANSETRON HCL 4 MG/2ML IJ SOLN
4.0000 mg | Freq: Once | INTRAMUSCULAR | Status: AC
  Administered 2018-06-17: 4 mg via INTRAVENOUS
  Filled 2018-06-17: qty 2

## 2018-06-17 MED ORDER — PROPOFOL 10 MG/ML IV BOLUS
INTRAVENOUS | Status: AC
  Filled 2018-06-17: qty 40

## 2018-06-17 MED ORDER — FENTANYL CITRATE (PF) 100 MCG/2ML IJ SOLN
INTRAMUSCULAR | Status: DC | PRN
  Administered 2018-06-17 (×2): 100 ug via INTRAVENOUS
  Administered 2018-06-17: 50 ug via INTRAVENOUS
  Administered 2018-06-17 (×2): 100 ug via INTRAVENOUS

## 2018-06-17 MED ORDER — OXYCODONE-ACETAMINOPHEN 5-325 MG PO TABS
1.0000 | ORAL_TABLET | ORAL | Status: DC | PRN
  Administered 2018-06-17 – 2018-06-18 (×4): 1 via ORAL
  Filled 2018-06-17 (×4): qty 1

## 2018-06-17 MED ORDER — ROCURONIUM BROMIDE 10 MG/ML (PF) SYRINGE
PREFILLED_SYRINGE | INTRAVENOUS | Status: AC
  Filled 2018-06-17: qty 10

## 2018-06-17 MED ORDER — SODIUM CHLORIDE (PF) 0.9 % IJ SOLN
INTRAMUSCULAR | Status: AC
  Filled 2018-06-17: qty 50

## 2018-06-17 MED ORDER — PROMETHAZINE HCL 25 MG/ML IJ SOLN
25.0000 mg | Freq: Once | INTRAMUSCULAR | Status: AC
  Administered 2018-06-17: 25 mg via INTRAVENOUS
  Filled 2018-06-17: qty 1

## 2018-06-17 MED ORDER — SODIUM CHLORIDE 0.9 % IV SOLN
INTRAVENOUS | Status: DC | PRN
  Administered 2018-06-17: 60 mL
  Administered 2018-06-17: 50 mL
  Administered 2018-06-17: 10 mL

## 2018-06-17 MED ORDER — PROPOFOL 10 MG/ML IV BOLUS
INTRAVENOUS | Status: DC | PRN
  Administered 2018-06-17: 200 mg via INTRAVENOUS

## 2018-06-17 MED ORDER — METOCLOPRAMIDE HCL 5 MG/ML IJ SOLN
10.0000 mg | Freq: Once | INTRAMUSCULAR | Status: AC
  Administered 2018-06-17: 10 mg via INTRAVENOUS
  Filled 2018-06-17: qty 2

## 2018-06-17 MED ORDER — ROCURONIUM BROMIDE 100 MG/10ML IV SOLN
INTRAVENOUS | Status: DC | PRN
  Administered 2018-06-17: 20 mg via INTRAVENOUS
  Administered 2018-06-17: 60 mg via INTRAVENOUS
  Administered 2018-06-17: 20 mg via INTRAVENOUS

## 2018-06-17 MED ORDER — MONTELUKAST SODIUM 10 MG PO TABS
10.0000 mg | ORAL_TABLET | Freq: Every day | ORAL | Status: DC
  Administered 2018-06-17: 10 mg via ORAL

## 2018-06-17 MED ORDER — ONDANSETRON HCL 4 MG PO TABS: 4.0000 mg | ORAL_TABLET | Freq: Three times a day (TID) | ORAL | Status: DC | PRN

## 2018-06-17 MED ORDER — KETOROLAC TROMETHAMINE 30 MG/ML IJ SOLN
30.0000 mg | Freq: Once | INTRAMUSCULAR | Status: DC
  Filled 2018-06-17: qty 1

## 2018-06-17 MED ORDER — METOCLOPRAMIDE HCL 10 MG/10ML PO SOLN
10.0000 mg | Freq: Once | ORAL | Status: DC
  Filled 2018-06-17: qty 10

## 2018-06-17 MED ORDER — LEVALBUTEROL HCL 1.25 MG/0.5ML IN NEBU: 1.2500 mg | INHALATION_SOLUTION | RESPIRATORY_TRACT | Status: DC | PRN

## 2018-06-17 MED ORDER — LIDOCAINE 2% (20 MG/ML) 5 ML SYRINGE
INTRAMUSCULAR | Status: AC
  Filled 2018-06-17: qty 5

## 2018-06-17 MED ORDER — ONDANSETRON HCL 4 MG/2ML IJ SOLN
INTRAMUSCULAR | Status: AC
  Filled 2018-06-17: qty 2

## 2018-06-17 MED ORDER — ROPIVACAINE HCL 5 MG/ML IJ SOLN
INTRAMUSCULAR | Status: AC
  Filled 2018-06-17: qty 60

## 2018-06-17 MED ORDER — SUGAMMADEX SODIUM 200 MG/2ML IV SOLN
INTRAVENOUS | Status: AC
  Filled 2018-06-17: qty 2

## 2018-06-17 MED ORDER — HYDROMORPHONE HCL 1 MG/ML IJ SOLN
INTRAMUSCULAR | Status: DC | PRN
  Administered 2018-06-17: 1 mg via INTRAVENOUS

## 2018-06-17 MED ORDER — SODIUM CHLORIDE (PF) 0.9 % IJ SOLN
INTRAMUSCULAR | Status: AC
  Filled 2018-06-17: qty 10

## 2018-06-17 SURGICAL SUPPLY — 57 items
ADH SKN CLS APL DERMABOND .7 (GAUZE/BANDAGES/DRESSINGS) ×3
BARRIER ADHS 3X4 INTERCEED (GAUZE/BANDAGES/DRESSINGS) ×6 IMPLANT
BRR ADH 4X3 ABS CNTRL BYND (GAUZE/BANDAGES/DRESSINGS) ×6
CANISTER SUCT 3000ML PPV (MISCELLANEOUS) ×4 IMPLANT
CATH FOLEY 3WAY  5CC 16FR (CATHETERS) ×1
CATH FOLEY 3WAY 5CC 16FR (CATHETERS) ×3 IMPLANT
COVER BACK TABLE 60X90IN (DRAPES) ×4 IMPLANT
COVER TIP SHEARS 8 DVNC (MISCELLANEOUS) ×3 IMPLANT
COVER TIP SHEARS 8MM DA VINCI (MISCELLANEOUS) ×1
DECANTER SPIKE VIAL GLASS SM (MISCELLANEOUS) ×8 IMPLANT
DEFOGGER SCOPE WARMER CLEARIFY (MISCELLANEOUS) ×4 IMPLANT
DERMABOND ADVANCED (GAUZE/BANDAGES/DRESSINGS) ×1
DERMABOND ADVANCED .7 DNX12 (GAUZE/BANDAGES/DRESSINGS) ×3 IMPLANT
DRAPE ARM DVNC X/XI (DISPOSABLE) ×12 IMPLANT
DRAPE COLUMN DVNC XI (DISPOSABLE) ×3 IMPLANT
DRAPE DA VINCI XI ARM (DISPOSABLE) ×4
DRAPE DA VINCI XI COLUMN (DISPOSABLE) ×1
DURAPREP 26ML APPLICATOR (WOUND CARE) ×4 IMPLANT
ELECT REM PT RETURN 9FT ADLT (ELECTROSURGICAL) ×4
ELECTRODE REM PT RTRN 9FT ADLT (ELECTROSURGICAL) ×3 IMPLANT
GLOVE BIOGEL PI IND STRL 7.0 (GLOVE) ×15 IMPLANT
GLOVE BIOGEL PI INDICATOR 7.0 (GLOVE) ×5
GLOVE ECLIPSE 6.5 STRL STRAW (GLOVE) ×12 IMPLANT
GLOVE SURG SS PI 6.0 STRL IVOR (GLOVE) ×4 IMPLANT
GLOVE SURG SS PI 7.0 STRL IVOR (GLOVE) ×6 IMPLANT
IRRIG SUCT STRYKERFLOW 2 WTIP (MISCELLANEOUS) ×4
IRRIGATION SUCT STRKRFLW 2 WTP (MISCELLANEOUS) ×3 IMPLANT
LEGGING LITHOTOMY PAIR STRL (DRAPES) ×2 IMPLANT
OBTURATOR OPTICAL STANDARD 8MM (TROCAR) ×1
OBTURATOR OPTICAL STND 8 DVNC (TROCAR) ×3
OBTURATOR OPTICALSTD 8 DVNC (TROCAR) ×1 IMPLANT
OCCLUDER COLPOPNEUMO (BALLOONS) ×4 IMPLANT
PACK ROBOT WH (CUSTOM PROCEDURE TRAY) ×4 IMPLANT
PACK ROBOTIC GOWN (GOWN DISPOSABLE) ×4 IMPLANT
PACK TRENDGUARD 450 HYBRID PRO (MISCELLANEOUS) ×1 IMPLANT
PAD PREP 24X48 CUFFED NSTRL (MISCELLANEOUS) ×4 IMPLANT
PROTECTOR NERVE ULNAR (MISCELLANEOUS) ×12 IMPLANT
SEAL CANN UNIV 5-8 DVNC XI (MISCELLANEOUS) ×12 IMPLANT
SEAL XI 5MM-8MM UNIVERSAL (MISCELLANEOUS) ×4
SEALER VESSEL DA VINCI XI (MISCELLANEOUS) ×1
SEALER VESSEL EXT DVNC XI (MISCELLANEOUS) ×1 IMPLANT
SET CYSTO W/LG BORE CLAMP LF (SET/KITS/TRAYS/PACK) ×4 IMPLANT
SET TRI-LUMEN FLTR TB AIRSEAL (TUBING) ×4 IMPLANT
STRIP CLOSURE SKIN 1/4X3 (GAUZE/BANDAGES/DRESSINGS) ×4 IMPLANT
STRIP CLOSURE SKIN 1/4X4 (GAUZE/BANDAGES/DRESSINGS) ×2 IMPLANT
SUT MNCRL AB 3-0 PS2 27 (SUTURE) ×8 IMPLANT
SUT VIC AB 0 CT1 27 (SUTURE) ×8
SUT VIC AB 0 CT1 27XBRD ANBCTR (SUTURE) ×6 IMPLANT
SUT VIC AB 0 CT1 36 (SUTURE) ×2 IMPLANT
SUT VICRYL 0 UR6 27IN ABS (SUTURE) ×4 IMPLANT
SUT VLOC 180 0 9IN  GS21 (SUTURE) ×2
SUT VLOC 180 0 9IN GS21 (SUTURE) ×6 IMPLANT
TIP UTERINE 6.7X10CM GRN DISP (MISCELLANEOUS) ×2 IMPLANT
TOWEL OR 17X26 10 PK STRL BLUE (TOWEL DISPOSABLE) ×4 IMPLANT
TRENDGUARD 450 HYBRID PRO PACK (MISCELLANEOUS) ×4
TROCAR PORT AIRSEAL 8X120 (TROCAR) ×4 IMPLANT
WATER STERILE IRR 1000ML POUR (IV SOLUTION) ×4 IMPLANT

## 2018-06-17 NOTE — Interval H&P Note (Signed)
History and Physical Interval Note:  06/17/2018 7:25 AM  Sylvia [name]  has presented today for surgery, with the diagnosis of Uterine Fibroids  The various methods of treatment have been discussed with the patient and family. After consideration of risks, benefits and other options for treatment, the patient has consented to  Procedure(s): XI ROBOTIC ASSISTED TOTAL HYSTERECTOMY WITH POSSIBLE MORCELLATION (Bilateral) with bilateral salpyngectomy as a surgical intervention .  The patient's history has been reviewed, patient examined, no change in status, stable for surgery.  I have reviewed the patient's chart and labs.  Questions were answered to the patient's satisfaction.     Katharine Look A Daily Doe

## 2018-06-17 NOTE — Anesthesia Procedure Notes (Signed)
Procedure Name: Intubation Date/Time: 06/17/2018 7:43 AM Performed by: Jonna Munro, CRNA Pre-anesthesia Checklist: Patient identified, Emergency Drugs available, Suction available, Patient being monitored and Timeout performed Patient Re-evaluated:Patient Re-evaluated prior to induction Oxygen Delivery Method: Circle system utilized Preoxygenation: Pre-oxygenation with 100% oxygen Induction Type: IV induction Ventilation: Mask ventilation without difficulty Laryngoscope Size: Mac and 3 Grade View: Grade II Tube type: Oral Tube size: 7.0 mm Number of attempts: 1 Airway Equipment and Method: Stylet Placement Confirmation: ETT inserted through vocal cords under direct vision,  positive ETCO2 and breath sounds checked- equal and bilateral Secured at: 22 cm Tube secured with: Tape Dental Injury: Teeth and Oropharynx as per pre-operative assessment

## 2018-06-17 NOTE — Transfer of Care (Signed)
Immediate Anesthesia Transfer of Care Note  Patient: Sylvia [name]  Procedure(s) Performed: XI ROBOTIC ASSISTED TOTAL HYSTERECTOMY, BILATERAL SALPINGECTOMY, LEFT OOPHORECTOMY (N/A ) CYSTOSCOPY XI ROBOTIC ASSISTED LAPAROSCOPIC LYSIS OF ADHESION  Patient Location: PACU  Anesthesia Type:General  Level of Consciousness: awake, alert  and oriented  Airway & Oxygen Therapy: Patient Spontanous Breathing and Patient connected to face mask oxygen  Post-op Assessment: Report given to RN and Post -op Vital signs reviewed and stable  Post vital signs: Reviewed and stable  Last Vitals:  Vitals Value Taken Time  BP    Temp    Pulse 98 06/17/2018 11:55 AM  Resp    SpO2 100 % 06/17/2018 11:55 AM  Vitals shown include unvalidated device data.  Last Pain:  Vitals:   06/17/18 0647  TempSrc:   PainSc: 0-No pain         Complications: No apparent anesthesia complications

## 2018-06-17 NOTE — Progress Notes (Addendum)
Dr. Cletis Media notified of pt's  Vitals of 100.0, 112, 18 159/91, 97% RA.  Pt asking to stay the night and stating that she didn't feel well enough to go home. Her nausea is slightly decreased but only tolerating a small cracker and a small amt of water.  Encouraged pt to ambulate w/ help but pt declining for now.  Will continue to monitor and encourage pt to use IS and to ambulate

## 2018-06-17 NOTE — Op Note (Signed)
Preoperative diagnosis: Uterine fibroids  Postoperative diagnosis: Same with Stage 4 endometriosis and pelvic adhesions  Anesthesia: General  Anesthesiologist: Dr. Ola Spurr  Procedure: Robotically assisted total hysterectomy with left salpyngo-oophorectomy and right salpingectomy, lysis of adhesions and cystoscopy  Surgeon: Dr. Katharine Look Bailley [place]  Assistant: Earnstine Regal P.A.-C.  Estimated blood loss: 100 cc  Procedure:  After being informed of the planned procedure with possible complications including but not limited to bleeding, infection, injury to other organs, need for laparotomy, possible need for morcellation with risks and benefits reviewed, expected hospital stay and recovery, informed consent is obtained and patient is taken to or #6. She is placed in  lithotomy position on [place] with both arms padded and tucked on each side and bilateral knee-high sequential compressive devices. She is given general anesthesia with endotracheal intubation without any complication. She is prepped and draped in a sterile fashion. A three-way Foley catheter is inserted in her bladder.  Pelvic exam reveals: enlarged uterus to 14 weeks but otherwise exam is limited by body habitus  A weighted speculum is inserted in the vagina and the anterior lip of the cervix is grasped with a tenaculum forcep. We proceed with a paracervical block and vaginal infiltration using ropivacaine 0.5% diluted 1 in 1 with saline. The uterus was then sounded at 12 cm. We easily dilate the cervix using Hegar dilator to  #27 which allows for easy placement of the intrauterine RUMI manipulator with a 3.5 KOH ring and a vaginal occluder. The ring is sutured to the cervix with 0 Vicryl.  Trocar placement is decided. We infiltrate 5 cm above the umbilicus with 10 cc of ropivacaine per protocol and perform a 10 mm vertical incision which is brought down bluntly to the fascia. The fascia is identified and grasped with Coker forceps.  The fascia is incised with Mayo scissors. Peritoneum is entered bluntly. A pursestring suture of 0 Vicryl is placed on the fascia and a 10 mm Hassan trocar is easily inserted in the abdominal cavity held in placed with a Pursestring suture. This allows for easy insufflation of a pneumoperitoneum using warmed CO2 at a maximum pressure of 15 mm of mercury. 60 cc of Ropivacaine 0.5 % diluted 1 in 1 is sent in the pelvis and the patient is positioned in reverse Trendelenburg. We then placed two 58mm robotic trocar on the left, one 77mm robotic trocar on the right and one 8 mm patient's side assistant trocar on the right  after infiltrating every site  with ropivacaine per protocol. The robot is docked on the right of the patient after positioning her in Trendelenburg. A monopolar scissor is inserted in arm #4, a Long bipolar forceps is inserted in arm #2 and a Prograsp is inserted in arm #1.  Preparation and docking is completed in 57 minutes.  Observation: There is adhesions between the omentum and the abdominal wall. Uterus is enlarged with multiple fibroids, 12-14 weeks in size. Anterior cul-de-sac is normal. Posterior cul-de-sac is completely obliterated with stage 4 endometriosis involving a 6 cm left endometrioma and a 4 cm right ovarian simple cyst. Both ovaries are adherent to the posterior uterus. Both tubes are normal.   We begin with sharp dissection of the omental adhesions. We then mobilize the left ovary bluntly which causes its cyst rupture. We further address and divide adhesions with the sigmoid colon. This allows [place] to identify the left ureter and proceed with cauterization of the left infundibulo-pelvic ligament to rmove the left ovary with its tube.  Using VesselSeal on the right, we seal and section the mesosalpynx , the right utero-ovarian ligament and the right round ligament . The right ovary is dissected off the uterus which causes rupture of the simple cyst.This gives [place] entry into the  retroperitoneal space with an easy dissection of the anterior broad ligament. This was opened all the way to the left round ligament.   We then proceed with systematic dissection of the bladder over way from the anterior vaginal cuff which is easily identified with the KOH ring. The plane of dissection is easily identified and confirmed after filling the bladder with 200 cc of saline. We are able to dissect the bladder 2 cm below the KOH ring. We then opened the posterior right broad ligament all the way to the posterior KOH ring after identifying the full course of the right ureter.   Moving to the left side, we seal and section the left round ligament.Entry into the retroperitoneal space allows [place] to complete dissection of the bladder on the left side and skeletonized the all uterine vessels. The left broad ligament is then dissected all the way to the posterior KOH ring after identifying the full course of the left ureter.  To reach the posterior Koh ring, we bluntly dissected bowels down and freed the posterior cul-de-sac.   With pressure on the KOH ring and the bladder fully dissected below we are able to cauterize the uterine vessels on both sides at the level of the KOH ring.  The vaginal occluder is inflated and we proceed with a 360 colpotomy using an open monopolar scissors and freeing the uterus entirely with the right tube.  The uterus is delivered vaginally with traction only. The vaginal occluder is reinserted in the vagina to maintain pneumoperitoneum.  Instruments are then modified for a suture cut in arm #4 and a long tip forcep in arm #2. We proceed with closure of the vaginal cuff with 2 running sutures of 0 V-Lock. We irrigated profusely with warm saline and confirm a satisfactory hemostasis. A sheet of Interceed is placed on the vaginal cuff.  All instruments are then removed and the robot is undocked. Console time: 1 hour and 57 minutes.  All trochars are removed under direct  visualization after evacuating the pneumoperitoneum.  The fascia of the supraumbilical incision is closed with the previously placed pursestring suture of 0 Vicryl. All incisions are then closed with subcuticular suture of 3-0 Monocryl and Dermabond.  A speculum is inserted in the vagina to confirm a adequate closure of the vaginal cuff and good hemostasis.  We proceed with cystoscopy which rapidly confirms an intact bladder and 2 normal ureteral jets. The bladder is backfilled with 200 cc of Saline and the Foley is removed.  Instrument and sponge count is complete x2. Estimated blood loss is 100 cc. The procedure is well tolerated by the patient is taken to recovery room in a well and stable condition.  Specimen: Uterus, tubes and left ovary weighing 182.4 g sent to pathology

## 2018-06-17 NOTE — Discharge Instructions (Signed)
Call [facility] @ [phone] if: ° °You have a temperature greater than or equal to 100.4 degrees Farenheit orally °You have pain that is not made better by the pain medication given and taken as directed °You have excessive bleeding or problems urinating ° °Take Colace (Docusate Sodium/Stool Softener) 100 mg 2-3 times daily while taking narcotic pain medicine to avoid constipation or until bowel movements are regular. °Take Ibuprofen 600 mg with food every 6 hours for 5 days then as needed for pain ° °You may drive after 1 week °You may walk up steps ° °You may shower tomorrow °You may resume a regular diet ° °Keep incisions clean and dry °Do not lift over 15 pounds for 6 weeks °Avoid anything in vagina for 6 weeks (or until after your post-operative visit) ° °

## 2018-06-17 NOTE — Progress Notes (Signed)
06/17/2018 5:58 PM Dr. Cletis Media called and made aware of pt. Continued struggles with PONV. Pt. Unable to tolerate PO's, unable to ambulate and found no relief from Reglan 10 MG IV x 1 administered earlier as ordered by Dr. Cletis Media. Verbal order received to administer 25 mg IV Phenergan x 1. MD also updated on pt. Elevated BP, orders received to continue to monitor and notify MD as needed. Will continue to closely monitor patient.  Kmari Halter, Arville Lime

## 2018-06-18 ENCOUNTER — Encounter (HOSPITAL_BASED_OUTPATIENT_CLINIC_OR_DEPARTMENT_OTHER): Payer: Self-pay | Admitting: Obstetrics and Gynecology

## 2018-06-18 DIAGNOSIS — D259 Leiomyoma of uterus, unspecified: Secondary | ICD-10-CM | POA: Diagnosis not present

## 2018-06-18 NOTE — Progress Notes (Signed)
Sylvia [name] is a50 y.o.  [id number]  Post Op Date # Robot Assisted Hysterectomy/RSO/LS/Lysis of Adhesions  Subjective: Patient is Doing well postoperatively. Patient has Pain is controlled with current analgesics. Medications being used: prescription NSAID's including Ibuprofen 600 mg and narcotic analgesics including Percocet 5/325 mg.Ambulating, tolerating a regular diet  and voiding without difficulty.   Objective: Vital signs in last 24 hours: Temp:  [97.9 F (36.6 C)-100 F (37.8 C)] 99.1 F (37.3 C) (12/13 0604) Pulse Rate:  [95-111] 98 (12/13 0604) Resp:  [10-18] 18 (12/13 0604) BP: (138-184)/(63-101) 146/80 (12/13 0604) SpO2:  [92 %-100 %] 96 % (12/13 0604)  Intake/Output from previous day: 12/12 0701 - 12/13 0700 In: 3348.1 [P.O.:150; I.V.:3198.1] Out: 2050 [Urine:1950] Intake/Output this shift: No intake/output data recorded. Recent Labs  Lab 06/11/18 1140  WBC 11.8*  HGB 12.3  HCT 39.9  PLT 325     Recent Labs  Lab 06/11/18 1140  NA 140  K 3.9  CL 106  CO2 27  BUN 12  CREATININE 0.71  CALCIUM 8.8*  GLUCOSE 102*    EXAM: General: alert, cooperative and no distress Resp: clear to auscultation bilaterally Cardio: regular rate and rhythm, S1, S2 normal, no murmur, click, rub or gallop GI: bowel sounds present, soft, incisions intact without evidence of infection Extremities: Homans sign is negative, no sign of DVT and no calf tenderness and SCD hose in place and functioning.   Assessment: s/p Procedure(s): XI ROBOTIC ASSISTED TOTAL HYSTERECTOMY, BILATERAL SALPINGECTOMY, LEFT OOPHORECTOMY CYSTOSCOPY XI ROBOTIC ASSISTED LAPAROSCOPIC LYSIS OF ADHESION: stable and progressing well  Plan: Discharge home  LOS: 0 days    Earnstine Regal, PA-C 06/18/2018 8:11 AM

## 2018-06-18 NOTE — Progress Notes (Signed)
Pt states that she feels much better.  No c/o nausea.  Pain is a 3-4.  Pt walked in hall w/ standby asst.  Steady gait.  Voiding mod amt of amber urine.

## 2018-06-18 NOTE — Anesthesia Postprocedure Evaluation (Signed)
Anesthesia Post Note  Patient: Sylvia [name]  Procedure(s) Performed: XI ROBOTIC ASSISTED TOTAL HYSTERECTOMY, BILATERAL SALPINGECTOMY, LEFT OOPHORECTOMY (N/A ) CYSTOSCOPY XI ROBOTIC ASSISTED LAPAROSCOPIC LYSIS OF ADHESION     Patient location during evaluation: PACU Anesthesia Type: General Level of consciousness: awake and alert Pain management: pain level controlled Vital Signs Assessment: post-procedure vital signs reviewed and stable Respiratory status: spontaneous breathing, nonlabored ventilation, respiratory function stable and patient connected to nasal cannula oxygen Cardiovascular status: blood pressure returned to baseline and stable Postop Assessment: no apparent nausea or vomiting Anesthetic complications: no    Last Vitals:  Vitals:   06/18/18 0151 06/18/18 0604  BP: 138/71 (!) 146/80  Pulse: 100 98  Resp: 16 18  Temp: 37 C 37.3 C  SpO2: 97% 96%    Last Pain:  Vitals:   06/18/18 0633  TempSrc:   PainSc: 3    Pain Goal: Patients Stated Pain Goal: 2 (06/18/18 0600)               Tiajuana Amass

## 2019-09-28 ENCOUNTER — Encounter: Payer: BC Managed Care – PPO | Attending: Family Medicine | Admitting: Dietician

## 2019-09-28 ENCOUNTER — Other Ambulatory Visit: Payer: Self-pay

## 2019-09-28 DIAGNOSIS — R7303 Prediabetes: Secondary | ICD-10-CM | POA: Diagnosis not present

## 2019-09-28 NOTE — Patient Instructions (Signed)
   Consider other sources of protein (new plant-based sources, seafood, etc.)   Become familiar with which foods have carbohydrates in them and try carb counting (2-4 servings/meal and 0-2 servings/snack)

## 2019-09-28 NOTE — Progress Notes (Signed)
Medical Nutrition Therapy   Primary concerns today: protein intake, prediabetes   Referral diagnoses: E66.01- obesity;  R73.03- prediabetes Preferred learning style: no preference indicated Learning readiness: ready   NUTRITION ASSESSMENT   Clinical Medical Hx: GERD, HTN, obesity, prediabetes  Labs: A1c: 6.3% Allergies: peanuts, treenuts, banana sensitivity, coconut sensitivity   Lifestyle & Dietary Hx Follows lacto-ovo vegetarian diet, considering adding seafood. May have cauliflower rice instead of brown rice, avoids large amounts of pasta or bread. Will have sweet potatoes and kale and other vegetables. Does not eat out much and does not purchase sweets. Will eat dairy (greek yogurt, butter, lactose-free ice cream) except for milk.   24-Hr Dietary Recall First Meal: lemon water + half-caff coffee + oatmeal + dates + hard boiled egg  Snack: - Second Meal: salad + homemade vinaigrette dressing + Lean Cuisine  Snack: popcorn  Third Meal: brown rice + plant-based burger  Snack: - Beverages: water  Estimated Energy Needs Calories: 1800 Carbohydrate: 200g Protein: 113g Fat: 60g   NUTRITION DIAGNOSIS  Inadequate protein intake (NI-5.6.1) related to vegetarian diet as evidenced by patient reported dietary intake.    NUTRITION INTERVENTION  Nutrition education (E-1) on the following topics:  . Plant protein  . Prediabetes nutrition therapy/ consistent carbohydrate intake   Handouts Provided Include   Plant Protein   Yellow Meal Plan Card for Carb Counting  MyPlate Portions  Meal Ideas Sheet  Orgain shake/powder samples  Learning Style & Readiness for Change Teaching method utilized: Visual & Auditory  Demonstrated degree of understanding via: Teach Back  Barriers to learning/adherence to lifestyle change: None Identified   Goals Established by Pt . Consider other sources of protein (new plant-based sources, seafood, etc.)  . Become familiar with which foods  have carbohydrates in them and try carb counting (2-4 servings/meal and 0-2 servings/snack)    MONITORING & EVALUATION Dietary intake, weekly physical activity, and goals in 4 weeks.  Next Steps  Patient is to return to [facility] for follow up in about 4 weeks.

## 2019-09-29 ENCOUNTER — Ambulatory Visit: Payer: BC Managed Care – PPO | Attending: Family

## 2019-09-29 DIAGNOSIS — Z23 Encounter for immunization: Secondary | ICD-10-CM

## 2019-09-29 NOTE — Progress Notes (Signed)
   Covid-19 Vaccination Clinic  Name:  Sylvia [name]    MRN: [id number] DOB: Jul 06, 1971  09/29/2019  Sylvia [name] was observed post Covid-19 immunization for 15 minutes without incident. She was provided with Vaccine Information Sheet and instruction to access the V-Safe system.   Sylvia [name] was instructed to call 911 with any severe reactions post vaccine: Marland Kitchen Difficulty breathing  . Swelling of face and throat  . A fast heartbeat  . A bad rash all over body  . Dizziness and weakness   Immunizations Administered    Name Date Dose VIS Date Route   Moderna COVID-19 Vaccine 09/29/2019  4:05 PM 0.5 mL 06/07/2019 Intramuscular   Manufacturer: Moderna   Lot: [id number]   [place][id number]

## 2019-10-25 ENCOUNTER — Ambulatory Visit: Payer: BC Managed Care – PPO | Admitting: Dietician

## 2019-11-01 ENCOUNTER — Ambulatory Visit: Payer: BC Managed Care – PPO | Attending: Family

## 2019-11-01 ENCOUNTER — Encounter: Payer: Self-pay | Admitting: Dietician

## 2019-11-01 ENCOUNTER — Encounter: Payer: BC Managed Care – PPO | Attending: Family Medicine | Admitting: Dietician

## 2019-11-01 ENCOUNTER — Other Ambulatory Visit: Payer: Self-pay

## 2019-11-01 DIAGNOSIS — R7303 Prediabetes: Secondary | ICD-10-CM

## 2019-11-01 DIAGNOSIS — Z23 Encounter for immunization: Secondary | ICD-10-CM

## 2019-11-01 NOTE — Progress Notes (Signed)
   Covid-19 Vaccination Clinic  Name:  Sylvia [name]    MRN: [id number] DOB: 1971/04/22  11/01/2019  [name] was observed post Covid-19 immunization for 15 minutes without incident. She was provided with Vaccine Information Sheet and instruction to access the V-Safe system.   [name] was instructed to call 911 with any severe reactions post vaccine: Marland Kitchen Difficulty breathing  . Swelling of face and throat  . A fast heartbeat  . A bad rash all over body  . Dizziness and weakness   Immunizations Administered    Name Date Dose VIS Date Route   Moderna COVID-19 Vaccine 11/01/2019  2:53 PM 0.5 mL 06/2019 Intramuscular   Manufacturer: Moderna   Lot: [id number]   [place][id number]

## 2019-11-01 NOTE — Patient Instructions (Signed)
   Consider food journaling to get a good picture of what/how much you are eating throughout the day.   Include snacks as needed (see Balanced Snacks sheet).   Try incorporating a balanced breakfast in the mornings (see Breakfast Ideas sheet).

## 2019-11-01 NOTE — Progress Notes (Signed)
Medical Nutrition Therapy  Follow-Up Visit  Patient was previously seen on 09/28/2019 for prediabetes and weight management.  Primary concerns today: food intake, weight management   Preferred learning style: no preference indicated Learning readiness: ready, change in progress   NUTRITION ASSESSMENT   Clinical Medical Hx: GERD, HTN, obesity, prediabetes  Labs: A1c: 6.3% Allergies: peanuts, treenuts, banana sensitivity, coconut sensitivity   Lifestyle & Dietary Hx Follows lacto-ovo vegetarian diet, still working on adding in seafood. States she has been cutting back on bread and will use thin slices of bread, making sure it is also a hearty source such as whole grain. Been eating lots of tossed salad (mixed greens, cucumbers, etc) and roasted vegetables. Will do gluten free pasta and cauliflower rice. May have a single serve cup of ice cream occasionally (~18g sugar) as a treat. States she is in an adjustment period for trying new foods and figuring out ways to not overeat carbohydrates/sugar. States she realized she has been eating a lot of fruit so has been monitoring this. States she would like to start food journaling to get a better picture of her intake, as it is likely she is not eating enough calories. Also states she intends to meet with a counselor.   24-Hr Dietary Recall First Meal: 2 hard boiled eggs Snack: - Second Meal: - Snack: celery + sunbutter  Third Meal: tossed salad  Snack: - Beverages: water, black decaf coffee  Estimated Energy Needs Calories: 1800 Carbohydrate: 200g Protein: 113g Fat: 60g   NUTRITION DIAGNOSIS  Inadequate protein intake (NI-5.6.1) related to vegetarian diet as evidenced by patient reported dietary intake.    NUTRITION INTERVENTION  Nutrition education (E-1) on the following topics:  . General healthful nutrition, including importance of consistent food intake throughout the day and relationship with food (effects of stress as well as  mental health)    Handouts Provided Include   Balanced Snacks  Breakfast Ideas  Metabolism Boosters (provided via email to [email or web address])   Learning Style & Readiness for Change Teaching method utilized: Visual & Auditory  Demonstrated degree of understanding via: Teach Back  Barriers to learning/adherence to lifestyle change: None Identified   Goals Established by Pt . Consider food journaling to get a good picture of what/how much you are eating throughout the day.  . Include snacks as needed (see Balanced Snacks sheet).  . Try incorporating a balanced breakfast in the mornings (see Breakfast Ideas sheet).    MONITORING & EVALUATION Dietary intake, weekly physical activity, and goals in 4 weeks.  Next Steps  Patient is to return to [facility] for follow up in about 4 weeks.

## 2019-11-30 ENCOUNTER — Ambulatory Visit: Payer: BC Managed Care – PPO | Admitting: Dietician

## 2019-12-08 ENCOUNTER — Other Ambulatory Visit: Payer: Self-pay

## 2019-12-08 ENCOUNTER — Encounter: Payer: Self-pay | Admitting: Dietician

## 2019-12-08 ENCOUNTER — Encounter: Payer: BC Managed Care – PPO | Attending: Family Medicine | Admitting: Dietician

## 2019-12-08 DIAGNOSIS — R7303 Prediabetes: Secondary | ICD-10-CM | POA: Diagnosis not present

## 2019-12-08 NOTE — Patient Instructions (Signed)
Continue working on being consistent with food journaling, finding what works best for you, getting in 3 meals per day, and being mindful. Great job with all of this!!

## 2019-12-08 NOTE — Progress Notes (Signed)
Medical Nutrition Therapy  Follow-Up Visit  Patient was previously seen on 09/28/2019 & 11/01/2019 for prediabetes and weight management.  Primary concerns today: consistency  Preferred learning style: no preference indicated Learning readiness: ready, change in progress   NUTRITION ASSESSMENT   Clinical Medical Hx: GERD, HTN, obesity, prediabetes  Labs: A1c: 6.3% Allergies: peanuts, treenuts, banana sensitivity, coconut sensitivity   Lifestyle & Dietary Hx States she has been working on consistency, such as tracking what she eats/drinks throughout the day and working on getting in 3 meals per day. Working on mindfulness. States she had testing done and learned about her food sensitivities, and states she would like to avoid high-fat dairy. Tried meat again, but did not set well with her so will continue to avoid and aim to stick to now ovo vegetarian diet. Typical meal pattern is 2-3 meals per day and maybe a snack or 2. Snacks may include frozen pineapple, sunbutter, or a cup of oatmeal with cinnamon, ginger, & dates or small amount of honey/maple syrup. May have Kodiak protein waffles or thin toast with sunbutter on the weekends for breakfast rather than her usual breakfast. For starches at dinner, may do the cauliflower rice, sweet potatoes, or gluten free pasta. Occasionally has small ice cream cup in the evening for dessert. States she tried plant based vanilla protein powder and likes this, will use in smoothies sometimes. States that lunch is most difficult for her, due to busyness at work and also planning all other meals for her family throughout the week. Has transitioned from drinking decaf coffee to decaf green tea.   24-Hr Dietary Recall First Meal: egg whites w/ sauteed kale + [employer] sausage + [employer] bread thin slices + 1 small apple + decaf green tea Snack: - Second Meal: Lean Cuisine + lemon water  Snack: 3-4 pieces frozen pineapple   Third Meal: beans +  beyond meat patty + roasted vegetables + cauliflower rice Snack: - Beverages: water, black decaf green tea  Estimated Energy Needs Calories: 1800 Carbohydrate: 200g Protein: 113g Fat: 60g   NUTRITION DIAGNOSIS  Inadequate protein intake (NI-5.6.1) related to vegetarian diet as evidenced by patient reported dietary intake.    NUTRITION INTERVENTION  Nutrition education (E-1) on the following topics:  . General healthful nutrition, including importance of consistent food intake throughout the day and relationship with food (effects of stress as well as mental health)    Handouts Provided Include  N/A  Learning Style & Readiness for Change Teaching method utilized: Visual & Auditory  Demonstrated degree of understanding via: Teach Back  Barriers to learning/adherence to lifestyle change: None Identified   Goals Established by Pt . Continue working on being consistent with food journaling, getting in 3 meals per day, and being mindful of what/when eating   MONITORING & EVALUATION Dietary intake, weekly physical activity, and goals in 6 weeks.  Next Steps  Patient is to return to [facility] for follow up in about 6 weeks.

## 2020-01-23 ENCOUNTER — Ambulatory Visit: Payer: BC Managed Care – PPO | Admitting: Dietician

## 2020-01-31 ENCOUNTER — Other Ambulatory Visit: Payer: Self-pay

## 2020-01-31 ENCOUNTER — Encounter: Payer: BC Managed Care – PPO | Attending: Family Medicine | Admitting: Dietician

## 2020-01-31 DIAGNOSIS — R7303 Prediabetes: Secondary | ICD-10-CM

## 2020-01-31 NOTE — Progress Notes (Signed)
Medical Nutrition Therapy  Follow-Up Visit  Patient was previously seen for prediabetes and weight management.  Primary concerns today: cravings  Preferred learning style: no preference indicated Learning readiness: ready, change in progress   NUTRITION ASSESSMENT   Clinical Medical Hx: GERD, HTN, obesity, prediabetes  Allergies: peanuts, treenuts, banana sensitivity, coconut sensitivity   Lifestyle & Dietary Hx Patient states she has set a timeframe for eating between 10am and 10pm and that this works well for her. States she has tried implementing protein drinks and will have one 2x/day. Still avoiding meat and following ovo-vegetarian diet. States she does not track her food as much anymore but is familiar with what she is eating. States that she gets very busy at work so she will set reminders for herself to stop and eat lunch. States that in some ways her sweet cravings have been reduced, however she still experiences strong cravings for sweets in the evening. States that sometimes her appetite overall is very strong in the evenings and before bed. We discussed working on including more carbohydrates earlier/throughout the day   Typical meal pattern is 2-3 meals per day (including the protein drink) and maybe a snack or 2. Snacks may include frozen pineapple, sunbutter, or a cup of oatmeal with cinnamon, ginger, & dates or small amount of honey/maple syrup. May have Kodiak protein waffles or thin toast with sunbutter on the weekends for breakfast rather than her usual breakfast. For starches at dinner, may do the cauliflower rice, sweet potatoes, or gluten free pasta.   States that she would like more ideas for nut-free protein bars/ snacks to have on hand or to take when traveling. Would like to make her own trail mix.   24-Hr Dietary Recall First Meal: protein drink Snack: - Second Meal: Lean Cuisine + lemon water  Snack: 3-4 pieces frozen pineapple   Third Meal: beans + beyond meat  patty + roasted vegetables + cauliflower rice Snack: protein drink  Beverages: water, lemon water, black decaf green tea  Estimated Energy Needs Calories: 1800 Carbohydrate: 200g Protein: 113g Fat: 60g   NUTRITION DIAGNOSIS  Inadequate protein intake (NI-5.6.1) related to vegetarian diet as evidenced by patient reported dietary intake.  02/01/2020: Resolved- patient is consuming adequate protein based on estimated energy needs.    NUTRITION INTERVENTION  Nutrition education (E-1) on the following topics:  . Adequate carbohydrate intake throughout the day to promote consistent energy and help curb sweets cravings in the evening.      Learning Style & Readiness for Change Teaching method utilized: Visual & Auditory  Demonstrated degree of understanding via: Teach Back  Barriers to learning/adherence to lifestyle change: None Identified   Goals Established by Pt . Continue working on getting in 3 meals per day and being mindful of what/when eating . Ensure adequate carbohydrate intake throughout the day    MONITORING & EVALUATION Dietary intake, weekly physical activity, and goals in 6 weeks.  Next Steps  Patient is to contact [facility] to schedule follow up for about 6 weeks.

## 2020-02-01 ENCOUNTER — Encounter: Payer: Self-pay | Admitting: Dietician

## 2020-02-01 NOTE — Patient Instructions (Signed)
Continue working on getting in 3 meals per day and being mindful of what/when eating.   Ensure you are getting in enough carbohydrates throughout the day, as this can help with craving sweets and strong appetite in the evenings. Try carb sources such as potatoes/sweet potatoes, whole grain breads, corn, brown rice, beans, vegetables, and whole fruits.

## 2020-05-28 ENCOUNTER — Ambulatory Visit: Payer: BC Managed Care – PPO | Attending: Family

## 2020-05-28 DIAGNOSIS — Z23 Encounter for immunization: Secondary | ICD-10-CM

## 2020-09-11 NOTE — Progress Notes (Signed)
   Covid-19 Vaccination Clinic  Name:  NERINE PULSE    MRN: [id number] DOB: [birth year]  09/11/2020  Ms. Machamer was observed post Covid-19 immunization for 15 minutes without incident. She was provided with Vaccine Information Sheet and instruction to access the V-Safe system.   Ms. Kirsten was instructed to call 911 with any severe reactions post vaccine: Marland Kitchen Difficulty breathing  . Swelling of face and throat  . A fast heartbeat  . A bad rash all over body  . Dizziness and weakness   Immunizations Administered    Name Date Dose VIS Date Route   Moderna Covid-19 Booster Vaccine 05/28/2020  8:20 AM 0.25 mL 04/25/2020 Intramuscular   Manufacturer: Moderna   Lot: [id number]   [place]: [id number]

## 2021-07-17 DIAGNOSIS — F4320 Adjustment disorder, unspecified: Secondary | ICD-10-CM | POA: Diagnosis not present

## 2021-07-18 DIAGNOSIS — J3089 Other allergic rhinitis: Secondary | ICD-10-CM | POA: Diagnosis not present

## 2021-07-18 DIAGNOSIS — J301 Allergic rhinitis due to pollen: Secondary | ICD-10-CM | POA: Diagnosis not present

## 2021-07-18 DIAGNOSIS — J3081 Allergic rhinitis due to animal (cat) (dog) hair and dander: Secondary | ICD-10-CM | POA: Diagnosis not present

## 2021-07-22 DIAGNOSIS — R102 Pelvic and perineal pain: Secondary | ICD-10-CM | POA: Diagnosis not present

## 2021-07-22 DIAGNOSIS — Z6841 Body Mass Index (BMI) 40.0 and over, adult: Secondary | ICD-10-CM | POA: Diagnosis not present

## 2021-07-22 DIAGNOSIS — Z01419 Encounter for gynecological examination (general) (routine) without abnormal findings: Secondary | ICD-10-CM | POA: Diagnosis not present

## 2021-07-22 DIAGNOSIS — Z1231 Encounter for screening mammogram for malignant neoplasm of breast: Secondary | ICD-10-CM | POA: Diagnosis not present

## 2021-07-22 DIAGNOSIS — Z1211 Encounter for screening for malignant neoplasm of colon: Secondary | ICD-10-CM | POA: Diagnosis not present

## 2021-07-24 DIAGNOSIS — J3081 Allergic rhinitis due to animal (cat) (dog) hair and dander: Secondary | ICD-10-CM | POA: Diagnosis not present

## 2021-07-24 DIAGNOSIS — J3089 Other allergic rhinitis: Secondary | ICD-10-CM | POA: Diagnosis not present

## 2021-07-24 DIAGNOSIS — J301 Allergic rhinitis due to pollen: Secondary | ICD-10-CM | POA: Diagnosis not present

## 2021-07-31 DIAGNOSIS — J3081 Allergic rhinitis due to animal (cat) (dog) hair and dander: Secondary | ICD-10-CM | POA: Diagnosis not present

## 2021-07-31 DIAGNOSIS — J3089 Other allergic rhinitis: Secondary | ICD-10-CM | POA: Diagnosis not present

## 2021-07-31 DIAGNOSIS — J301 Allergic rhinitis due to pollen: Secondary | ICD-10-CM | POA: Diagnosis not present

## 2021-07-31 DIAGNOSIS — F4320 Adjustment disorder, unspecified: Secondary | ICD-10-CM | POA: Diagnosis not present

## 2021-08-07 DIAGNOSIS — J3081 Allergic rhinitis due to animal (cat) (dog) hair and dander: Secondary | ICD-10-CM | POA: Diagnosis not present

## 2021-08-07 DIAGNOSIS — J3089 Other allergic rhinitis: Secondary | ICD-10-CM | POA: Diagnosis not present

## 2021-08-07 DIAGNOSIS — J301 Allergic rhinitis due to pollen: Secondary | ICD-10-CM | POA: Diagnosis not present

## 2021-08-21 DIAGNOSIS — J3081 Allergic rhinitis due to animal (cat) (dog) hair and dander: Secondary | ICD-10-CM | POA: Diagnosis not present

## 2021-08-21 DIAGNOSIS — J301 Allergic rhinitis due to pollen: Secondary | ICD-10-CM | POA: Diagnosis not present

## 2021-08-21 DIAGNOSIS — J3089 Other allergic rhinitis: Secondary | ICD-10-CM | POA: Diagnosis not present

## 2021-08-28 DIAGNOSIS — J301 Allergic rhinitis due to pollen: Secondary | ICD-10-CM | POA: Diagnosis not present

## 2021-08-28 DIAGNOSIS — J3089 Other allergic rhinitis: Secondary | ICD-10-CM | POA: Diagnosis not present

## 2021-08-28 DIAGNOSIS — J3081 Allergic rhinitis due to animal (cat) (dog) hair and dander: Secondary | ICD-10-CM | POA: Diagnosis not present

## 2021-08-30 DIAGNOSIS — F4320 Adjustment disorder, unspecified: Secondary | ICD-10-CM | POA: Diagnosis not present

## 2021-09-07 DIAGNOSIS — E639 Nutritional deficiency, unspecified: Secondary | ICD-10-CM | POA: Diagnosis not present

## 2021-09-07 DIAGNOSIS — E669 Obesity, unspecified: Secondary | ICD-10-CM | POA: Diagnosis not present

## 2021-09-07 DIAGNOSIS — J45909 Unspecified asthma, uncomplicated: Secondary | ICD-10-CM | POA: Diagnosis not present

## 2021-09-07 DIAGNOSIS — Z713 Dietary counseling and surveillance: Secondary | ICD-10-CM | POA: Diagnosis not present

## 2021-09-07 DIAGNOSIS — E119 Type 2 diabetes mellitus without complications: Secondary | ICD-10-CM | POA: Diagnosis not present

## 2021-09-11 DIAGNOSIS — J301 Allergic rhinitis due to pollen: Secondary | ICD-10-CM | POA: Diagnosis not present

## 2021-09-11 DIAGNOSIS — J3081 Allergic rhinitis due to animal (cat) (dog) hair and dander: Secondary | ICD-10-CM | POA: Diagnosis not present

## 2021-09-11 DIAGNOSIS — J3089 Other allergic rhinitis: Secondary | ICD-10-CM | POA: Diagnosis not present

## 2021-09-14 DIAGNOSIS — Z713 Dietary counseling and surveillance: Secondary | ICD-10-CM | POA: Diagnosis not present

## 2021-09-14 DIAGNOSIS — E639 Nutritional deficiency, unspecified: Secondary | ICD-10-CM | POA: Diagnosis not present

## 2021-09-14 DIAGNOSIS — E669 Obesity, unspecified: Secondary | ICD-10-CM | POA: Diagnosis not present

## 2021-09-19 DIAGNOSIS — E639 Nutritional deficiency, unspecified: Secondary | ICD-10-CM | POA: Diagnosis not present

## 2021-09-19 DIAGNOSIS — Z713 Dietary counseling and surveillance: Secondary | ICD-10-CM | POA: Diagnosis not present

## 2021-09-19 DIAGNOSIS — E119 Type 2 diabetes mellitus without complications: Secondary | ICD-10-CM | POA: Diagnosis not present

## 2021-09-19 DIAGNOSIS — E669 Obesity, unspecified: Secondary | ICD-10-CM | POA: Diagnosis not present

## 2021-09-19 DIAGNOSIS — I10 Essential (primary) hypertension: Secondary | ICD-10-CM | POA: Diagnosis not present

## 2021-09-25 DIAGNOSIS — J3089 Other allergic rhinitis: Secondary | ICD-10-CM | POA: Diagnosis not present

## 2021-09-25 DIAGNOSIS — J301 Allergic rhinitis due to pollen: Secondary | ICD-10-CM | POA: Diagnosis not present

## 2021-09-25 DIAGNOSIS — J3081 Allergic rhinitis due to animal (cat) (dog) hair and dander: Secondary | ICD-10-CM | POA: Diagnosis not present

## 2021-10-01 DIAGNOSIS — F4320 Adjustment disorder, unspecified: Secondary | ICD-10-CM | POA: Diagnosis not present

## 2021-10-02 DIAGNOSIS — J3081 Allergic rhinitis due to animal (cat) (dog) hair and dander: Secondary | ICD-10-CM | POA: Diagnosis not present

## 2021-10-02 DIAGNOSIS — J3089 Other allergic rhinitis: Secondary | ICD-10-CM | POA: Diagnosis not present

## 2021-10-02 DIAGNOSIS — J301 Allergic rhinitis due to pollen: Secondary | ICD-10-CM | POA: Diagnosis not present

## 2021-10-09 DIAGNOSIS — J301 Allergic rhinitis due to pollen: Secondary | ICD-10-CM | POA: Diagnosis not present

## 2021-10-09 DIAGNOSIS — J3089 Other allergic rhinitis: Secondary | ICD-10-CM | POA: Diagnosis not present

## 2021-10-09 DIAGNOSIS — J3081 Allergic rhinitis due to animal (cat) (dog) hair and dander: Secondary | ICD-10-CM | POA: Diagnosis not present

## 2021-10-16 DIAGNOSIS — J301 Allergic rhinitis due to pollen: Secondary | ICD-10-CM | POA: Diagnosis not present

## 2021-10-16 DIAGNOSIS — J3081 Allergic rhinitis due to animal (cat) (dog) hair and dander: Secondary | ICD-10-CM | POA: Diagnosis not present

## 2021-10-16 DIAGNOSIS — J3089 Other allergic rhinitis: Secondary | ICD-10-CM | POA: Diagnosis not present

## 2021-10-23 DIAGNOSIS — J3089 Other allergic rhinitis: Secondary | ICD-10-CM | POA: Diagnosis not present

## 2021-10-23 DIAGNOSIS — J3081 Allergic rhinitis due to animal (cat) (dog) hair and dander: Secondary | ICD-10-CM | POA: Diagnosis not present

## 2021-10-23 DIAGNOSIS — J301 Allergic rhinitis due to pollen: Secondary | ICD-10-CM | POA: Diagnosis not present

## 2021-10-24 DIAGNOSIS — Z91040 Latex allergy status: Secondary | ICD-10-CM | POA: Diagnosis not present

## 2021-10-24 DIAGNOSIS — F4320 Adjustment disorder, unspecified: Secondary | ICD-10-CM | POA: Diagnosis not present

## 2021-10-24 DIAGNOSIS — K5900 Constipation, unspecified: Secondary | ICD-10-CM | POA: Diagnosis not present

## 2021-10-24 DIAGNOSIS — Z1211 Encounter for screening for malignant neoplasm of colon: Secondary | ICD-10-CM | POA: Diagnosis not present

## 2021-10-25 DIAGNOSIS — J301 Allergic rhinitis due to pollen: Secondary | ICD-10-CM | POA: Diagnosis not present

## 2021-10-31 DIAGNOSIS — Z1211 Encounter for screening for malignant neoplasm of colon: Secondary | ICD-10-CM | POA: Diagnosis not present

## 2021-10-31 DIAGNOSIS — K5900 Constipation, unspecified: Secondary | ICD-10-CM | POA: Diagnosis not present

## 2021-11-06 DIAGNOSIS — J3089 Other allergic rhinitis: Secondary | ICD-10-CM | POA: Diagnosis not present

## 2021-11-06 DIAGNOSIS — J3081 Allergic rhinitis due to animal (cat) (dog) hair and dander: Secondary | ICD-10-CM | POA: Diagnosis not present

## 2021-11-06 DIAGNOSIS — J301 Allergic rhinitis due to pollen: Secondary | ICD-10-CM | POA: Diagnosis not present

## 2021-11-07 DIAGNOSIS — F4320 Adjustment disorder, unspecified: Secondary | ICD-10-CM | POA: Diagnosis not present

## 2021-11-13 DIAGNOSIS — J3081 Allergic rhinitis due to animal (cat) (dog) hair and dander: Secondary | ICD-10-CM | POA: Diagnosis not present

## 2021-11-13 DIAGNOSIS — J3089 Other allergic rhinitis: Secondary | ICD-10-CM | POA: Diagnosis not present

## 2021-11-13 DIAGNOSIS — J301 Allergic rhinitis due to pollen: Secondary | ICD-10-CM | POA: Diagnosis not present

## 2021-11-20 DIAGNOSIS — J3081 Allergic rhinitis due to animal (cat) (dog) hair and dander: Secondary | ICD-10-CM | POA: Diagnosis not present

## 2021-11-20 DIAGNOSIS — J3089 Other allergic rhinitis: Secondary | ICD-10-CM | POA: Diagnosis not present

## 2021-11-20 DIAGNOSIS — J301 Allergic rhinitis due to pollen: Secondary | ICD-10-CM | POA: Diagnosis not present

## 2021-11-27 DIAGNOSIS — J301 Allergic rhinitis due to pollen: Secondary | ICD-10-CM | POA: Diagnosis not present

## 2021-11-27 DIAGNOSIS — J3089 Other allergic rhinitis: Secondary | ICD-10-CM | POA: Diagnosis not present

## 2021-11-27 DIAGNOSIS — J3081 Allergic rhinitis due to animal (cat) (dog) hair and dander: Secondary | ICD-10-CM | POA: Diagnosis not present

## 2021-12-05 DIAGNOSIS — J3089 Other allergic rhinitis: Secondary | ICD-10-CM | POA: Diagnosis not present

## 2021-12-05 DIAGNOSIS — Z91010 Allergy to peanuts: Secondary | ICD-10-CM | POA: Diagnosis not present

## 2021-12-05 DIAGNOSIS — J4540 Moderate persistent asthma, uncomplicated: Secondary | ICD-10-CM | POA: Diagnosis not present

## 2021-12-05 DIAGNOSIS — J301 Allergic rhinitis due to pollen: Secondary | ICD-10-CM | POA: Diagnosis not present

## 2021-12-05 DIAGNOSIS — J3081 Allergic rhinitis due to animal (cat) (dog) hair and dander: Secondary | ICD-10-CM | POA: Diagnosis not present

## 2021-12-17 DIAGNOSIS — H5211 Myopia, right eye: Secondary | ICD-10-CM | POA: Diagnosis not present

## 2021-12-17 DIAGNOSIS — E119 Type 2 diabetes mellitus without complications: Secondary | ICD-10-CM | POA: Diagnosis not present

## 2021-12-17 DIAGNOSIS — H35412 Lattice degeneration of retina, left eye: Secondary | ICD-10-CM | POA: Diagnosis not present

## 2021-12-17 DIAGNOSIS — H52223 Regular astigmatism, bilateral: Secondary | ICD-10-CM | POA: Diagnosis not present

## 2021-12-17 DIAGNOSIS — H524 Presbyopia: Secondary | ICD-10-CM | POA: Diagnosis not present

## 2021-12-19 DIAGNOSIS — J3089 Other allergic rhinitis: Secondary | ICD-10-CM | POA: Diagnosis not present

## 2021-12-19 DIAGNOSIS — J301 Allergic rhinitis due to pollen: Secondary | ICD-10-CM | POA: Diagnosis not present

## 2021-12-19 DIAGNOSIS — J3081 Allergic rhinitis due to animal (cat) (dog) hair and dander: Secondary | ICD-10-CM | POA: Diagnosis not present

## 2021-12-19 DIAGNOSIS — F4320 Adjustment disorder, unspecified: Secondary | ICD-10-CM | POA: Diagnosis not present

## 2021-12-25 DIAGNOSIS — J3089 Other allergic rhinitis: Secondary | ICD-10-CM | POA: Diagnosis not present

## 2021-12-25 DIAGNOSIS — J301 Allergic rhinitis due to pollen: Secondary | ICD-10-CM | POA: Diagnosis not present

## 2021-12-25 DIAGNOSIS — J3081 Allergic rhinitis due to animal (cat) (dog) hair and dander: Secondary | ICD-10-CM | POA: Diagnosis not present

## 2022-01-02 DIAGNOSIS — F4320 Adjustment disorder, unspecified: Secondary | ICD-10-CM | POA: Diagnosis not present

## 2022-01-08 DIAGNOSIS — J3089 Other allergic rhinitis: Secondary | ICD-10-CM | POA: Diagnosis not present

## 2022-01-08 DIAGNOSIS — J301 Allergic rhinitis due to pollen: Secondary | ICD-10-CM | POA: Diagnosis not present

## 2022-01-08 DIAGNOSIS — J3081 Allergic rhinitis due to animal (cat) (dog) hair and dander: Secondary | ICD-10-CM | POA: Diagnosis not present

## 2022-01-15 DIAGNOSIS — J3089 Other allergic rhinitis: Secondary | ICD-10-CM | POA: Diagnosis not present

## 2022-01-15 DIAGNOSIS — J301 Allergic rhinitis due to pollen: Secondary | ICD-10-CM | POA: Diagnosis not present

## 2022-01-15 DIAGNOSIS — J3081 Allergic rhinitis due to animal (cat) (dog) hair and dander: Secondary | ICD-10-CM | POA: Diagnosis not present

## 2022-01-17 DIAGNOSIS — D128 Benign neoplasm of rectum: Secondary | ICD-10-CM | POA: Diagnosis not present

## 2022-01-17 DIAGNOSIS — Z1211 Encounter for screening for malignant neoplasm of colon: Secondary | ICD-10-CM | POA: Diagnosis not present

## 2022-01-17 DIAGNOSIS — K621 Rectal polyp: Secondary | ICD-10-CM | POA: Diagnosis not present

## 2022-01-20 DIAGNOSIS — R7303 Prediabetes: Secondary | ICD-10-CM | POA: Diagnosis not present

## 2022-01-20 DIAGNOSIS — J4540 Moderate persistent asthma, uncomplicated: Secondary | ICD-10-CM | POA: Diagnosis not present

## 2022-01-20 DIAGNOSIS — I10 Essential (primary) hypertension: Secondary | ICD-10-CM | POA: Diagnosis not present

## 2022-01-29 DIAGNOSIS — J3081 Allergic rhinitis due to animal (cat) (dog) hair and dander: Secondary | ICD-10-CM | POA: Diagnosis not present

## 2022-01-29 DIAGNOSIS — J3089 Other allergic rhinitis: Secondary | ICD-10-CM | POA: Diagnosis not present

## 2022-01-29 DIAGNOSIS — J301 Allergic rhinitis due to pollen: Secondary | ICD-10-CM | POA: Diagnosis not present

## 2022-02-05 DIAGNOSIS — J3089 Other allergic rhinitis: Secondary | ICD-10-CM | POA: Diagnosis not present

## 2022-02-05 DIAGNOSIS — J301 Allergic rhinitis due to pollen: Secondary | ICD-10-CM | POA: Diagnosis not present

## 2022-02-05 DIAGNOSIS — J3081 Allergic rhinitis due to animal (cat) (dog) hair and dander: Secondary | ICD-10-CM | POA: Diagnosis not present

## 2022-02-13 DIAGNOSIS — J3081 Allergic rhinitis due to animal (cat) (dog) hair and dander: Secondary | ICD-10-CM | POA: Diagnosis not present

## 2022-02-13 DIAGNOSIS — J3089 Other allergic rhinitis: Secondary | ICD-10-CM | POA: Diagnosis not present

## 2022-02-13 DIAGNOSIS — J301 Allergic rhinitis due to pollen: Secondary | ICD-10-CM | POA: Diagnosis not present

## 2022-02-13 DIAGNOSIS — F4320 Adjustment disorder, unspecified: Secondary | ICD-10-CM | POA: Diagnosis not present

## 2022-02-21 DIAGNOSIS — J301 Allergic rhinitis due to pollen: Secondary | ICD-10-CM | POA: Diagnosis not present

## 2022-02-21 DIAGNOSIS — J3081 Allergic rhinitis due to animal (cat) (dog) hair and dander: Secondary | ICD-10-CM | POA: Diagnosis not present

## 2022-02-21 DIAGNOSIS — J3089 Other allergic rhinitis: Secondary | ICD-10-CM | POA: Diagnosis not present

## 2022-02-26 DIAGNOSIS — J3081 Allergic rhinitis due to animal (cat) (dog) hair and dander: Secondary | ICD-10-CM | POA: Diagnosis not present

## 2022-02-26 DIAGNOSIS — J3089 Other allergic rhinitis: Secondary | ICD-10-CM | POA: Diagnosis not present

## 2022-02-26 DIAGNOSIS — F4320 Adjustment disorder, unspecified: Secondary | ICD-10-CM | POA: Diagnosis not present

## 2022-02-26 DIAGNOSIS — J301 Allergic rhinitis due to pollen: Secondary | ICD-10-CM | POA: Diagnosis not present

## 2022-03-05 DIAGNOSIS — J301 Allergic rhinitis due to pollen: Secondary | ICD-10-CM | POA: Diagnosis not present

## 2022-03-05 DIAGNOSIS — J3089 Other allergic rhinitis: Secondary | ICD-10-CM | POA: Diagnosis not present

## 2022-03-05 DIAGNOSIS — J3081 Allergic rhinitis due to animal (cat) (dog) hair and dander: Secondary | ICD-10-CM | POA: Diagnosis not present

## 2022-03-12 DIAGNOSIS — F4320 Adjustment disorder, unspecified: Secondary | ICD-10-CM | POA: Diagnosis not present

## 2022-03-26 DIAGNOSIS — F4320 Adjustment disorder, unspecified: Secondary | ICD-10-CM | POA: Diagnosis not present

## 2022-03-27 DIAGNOSIS — J301 Allergic rhinitis due to pollen: Secondary | ICD-10-CM | POA: Diagnosis not present

## 2022-03-27 DIAGNOSIS — J3081 Allergic rhinitis due to animal (cat) (dog) hair and dander: Secondary | ICD-10-CM | POA: Diagnosis not present

## 2022-03-27 DIAGNOSIS — J3089 Other allergic rhinitis: Secondary | ICD-10-CM | POA: Diagnosis not present

## 2022-04-02 DIAGNOSIS — J3089 Other allergic rhinitis: Secondary | ICD-10-CM | POA: Diagnosis not present

## 2022-04-02 DIAGNOSIS — J3081 Allergic rhinitis due to animal (cat) (dog) hair and dander: Secondary | ICD-10-CM | POA: Diagnosis not present

## 2022-04-02 DIAGNOSIS — J301 Allergic rhinitis due to pollen: Secondary | ICD-10-CM | POA: Diagnosis not present

## 2022-04-09 DIAGNOSIS — F4320 Adjustment disorder, unspecified: Secondary | ICD-10-CM | POA: Diagnosis not present

## 2022-04-16 DIAGNOSIS — J301 Allergic rhinitis due to pollen: Secondary | ICD-10-CM | POA: Diagnosis not present

## 2022-04-16 DIAGNOSIS — J3081 Allergic rhinitis due to animal (cat) (dog) hair and dander: Secondary | ICD-10-CM | POA: Diagnosis not present

## 2022-04-16 DIAGNOSIS — J3089 Other allergic rhinitis: Secondary | ICD-10-CM | POA: Diagnosis not present

## 2022-05-15 DIAGNOSIS — J3089 Other allergic rhinitis: Secondary | ICD-10-CM | POA: Diagnosis not present

## 2022-05-15 DIAGNOSIS — J3081 Allergic rhinitis due to animal (cat) (dog) hair and dander: Secondary | ICD-10-CM | POA: Diagnosis not present

## 2022-05-15 DIAGNOSIS — J301 Allergic rhinitis due to pollen: Secondary | ICD-10-CM | POA: Diagnosis not present

## 2022-05-19 DIAGNOSIS — I10 Essential (primary) hypertension: Secondary | ICD-10-CM | POA: Diagnosis not present

## 2022-05-19 DIAGNOSIS — R7303 Prediabetes: Secondary | ICD-10-CM | POA: Diagnosis not present

## 2022-05-19 DIAGNOSIS — Z23 Encounter for immunization: Secondary | ICD-10-CM | POA: Diagnosis not present

## 2022-05-19 DIAGNOSIS — F4320 Adjustment disorder, unspecified: Secondary | ICD-10-CM | POA: Diagnosis not present

## 2022-05-21 DIAGNOSIS — J3089 Other allergic rhinitis: Secondary | ICD-10-CM | POA: Diagnosis not present

## 2022-05-21 DIAGNOSIS — J301 Allergic rhinitis due to pollen: Secondary | ICD-10-CM | POA: Diagnosis not present

## 2022-05-21 DIAGNOSIS — J3081 Allergic rhinitis due to animal (cat) (dog) hair and dander: Secondary | ICD-10-CM | POA: Diagnosis not present

## 2022-06-04 DIAGNOSIS — F4320 Adjustment disorder, unspecified: Secondary | ICD-10-CM | POA: Diagnosis not present

## 2022-06-05 DIAGNOSIS — J301 Allergic rhinitis due to pollen: Secondary | ICD-10-CM | POA: Diagnosis not present

## 2022-06-05 DIAGNOSIS — J3089 Other allergic rhinitis: Secondary | ICD-10-CM | POA: Diagnosis not present

## 2022-06-05 DIAGNOSIS — J3081 Allergic rhinitis due to animal (cat) (dog) hair and dander: Secondary | ICD-10-CM | POA: Diagnosis not present

## 2022-06-09 DIAGNOSIS — J4540 Moderate persistent asthma, uncomplicated: Secondary | ICD-10-CM | POA: Diagnosis not present

## 2022-06-09 DIAGNOSIS — J3081 Allergic rhinitis due to animal (cat) (dog) hair and dander: Secondary | ICD-10-CM | POA: Diagnosis not present

## 2022-06-09 DIAGNOSIS — J3089 Other allergic rhinitis: Secondary | ICD-10-CM | POA: Diagnosis not present

## 2022-06-09 DIAGNOSIS — J301 Allergic rhinitis due to pollen: Secondary | ICD-10-CM | POA: Diagnosis not present

## 2022-06-09 DIAGNOSIS — Z91010 Allergy to peanuts: Secondary | ICD-10-CM | POA: Diagnosis not present

## 2022-06-18 DIAGNOSIS — F4320 Adjustment disorder, unspecified: Secondary | ICD-10-CM | POA: Diagnosis not present

## 2022-06-25 DIAGNOSIS — J3089 Other allergic rhinitis: Secondary | ICD-10-CM | POA: Diagnosis not present

## 2022-06-25 DIAGNOSIS — J3081 Allergic rhinitis due to animal (cat) (dog) hair and dander: Secondary | ICD-10-CM | POA: Diagnosis not present

## 2022-06-25 DIAGNOSIS — J301 Allergic rhinitis due to pollen: Secondary | ICD-10-CM | POA: Diagnosis not present

## 2022-07-03 DIAGNOSIS — J301 Allergic rhinitis due to pollen: Secondary | ICD-10-CM | POA: Diagnosis not present

## 2022-07-03 DIAGNOSIS — J3081 Allergic rhinitis due to animal (cat) (dog) hair and dander: Secondary | ICD-10-CM | POA: Diagnosis not present

## 2022-07-03 DIAGNOSIS — J3089 Other allergic rhinitis: Secondary | ICD-10-CM | POA: Diagnosis not present

## 2022-07-04 DIAGNOSIS — J3081 Allergic rhinitis due to animal (cat) (dog) hair and dander: Secondary | ICD-10-CM | POA: Diagnosis not present

## 2022-07-04 DIAGNOSIS — J3089 Other allergic rhinitis: Secondary | ICD-10-CM | POA: Diagnosis not present

## 2022-07-10 DIAGNOSIS — J3081 Allergic rhinitis due to animal (cat) (dog) hair and dander: Secondary | ICD-10-CM | POA: Diagnosis not present

## 2022-07-10 DIAGNOSIS — J3089 Other allergic rhinitis: Secondary | ICD-10-CM | POA: Diagnosis not present

## 2022-07-10 DIAGNOSIS — J301 Allergic rhinitis due to pollen: Secondary | ICD-10-CM | POA: Diagnosis not present

## 2022-07-16 DIAGNOSIS — F4320 Adjustment disorder, unspecified: Secondary | ICD-10-CM | POA: Diagnosis not present

## 2022-07-25 DIAGNOSIS — J3089 Other allergic rhinitis: Secondary | ICD-10-CM | POA: Diagnosis not present

## 2022-07-25 DIAGNOSIS — J301 Allergic rhinitis due to pollen: Secondary | ICD-10-CM | POA: Diagnosis not present

## 2022-07-25 DIAGNOSIS — J3081 Allergic rhinitis due to animal (cat) (dog) hair and dander: Secondary | ICD-10-CM | POA: Diagnosis not present

## 2022-08-06 DIAGNOSIS — J3081 Allergic rhinitis due to animal (cat) (dog) hair and dander: Secondary | ICD-10-CM | POA: Diagnosis not present

## 2022-08-06 DIAGNOSIS — J301 Allergic rhinitis due to pollen: Secondary | ICD-10-CM | POA: Diagnosis not present

## 2022-08-06 DIAGNOSIS — J3089 Other allergic rhinitis: Secondary | ICD-10-CM | POA: Diagnosis not present

## 2022-08-21 DIAGNOSIS — J301 Allergic rhinitis due to pollen: Secondary | ICD-10-CM | POA: Diagnosis not present

## 2022-08-21 DIAGNOSIS — J3089 Other allergic rhinitis: Secondary | ICD-10-CM | POA: Diagnosis not present

## 2022-08-21 DIAGNOSIS — J3081 Allergic rhinitis due to animal (cat) (dog) hair and dander: Secondary | ICD-10-CM | POA: Diagnosis not present

## 2022-08-26 DIAGNOSIS — J301 Allergic rhinitis due to pollen: Secondary | ICD-10-CM | POA: Diagnosis not present

## 2022-08-26 DIAGNOSIS — J3089 Other allergic rhinitis: Secondary | ICD-10-CM | POA: Diagnosis not present

## 2022-08-26 DIAGNOSIS — J3081 Allergic rhinitis due to animal (cat) (dog) hair and dander: Secondary | ICD-10-CM | POA: Diagnosis not present

## 2022-08-27 DIAGNOSIS — F4320 Adjustment disorder, unspecified: Secondary | ICD-10-CM | POA: Diagnosis not present

## 2022-09-02 DIAGNOSIS — J301 Allergic rhinitis due to pollen: Secondary | ICD-10-CM | POA: Diagnosis not present

## 2022-09-02 DIAGNOSIS — J3081 Allergic rhinitis due to animal (cat) (dog) hair and dander: Secondary | ICD-10-CM | POA: Diagnosis not present

## 2022-09-02 DIAGNOSIS — J3089 Other allergic rhinitis: Secondary | ICD-10-CM | POA: Diagnosis not present

## 2022-09-08 DIAGNOSIS — R6882 Decreased libido: Secondary | ICD-10-CM | POA: Diagnosis not present

## 2022-09-08 DIAGNOSIS — Z1231 Encounter for screening mammogram for malignant neoplasm of breast: Secondary | ICD-10-CM | POA: Diagnosis not present

## 2022-09-08 DIAGNOSIS — N951 Menopausal and female climacteric states: Secondary | ICD-10-CM | POA: Diagnosis not present

## 2022-09-08 DIAGNOSIS — Z01419 Encounter for gynecological examination (general) (routine) without abnormal findings: Secondary | ICD-10-CM | POA: Diagnosis not present

## 2022-09-09 DIAGNOSIS — J3081 Allergic rhinitis due to animal (cat) (dog) hair and dander: Secondary | ICD-10-CM | POA: Diagnosis not present

## 2022-09-09 DIAGNOSIS — J301 Allergic rhinitis due to pollen: Secondary | ICD-10-CM | POA: Diagnosis not present

## 2022-09-09 DIAGNOSIS — J3089 Other allergic rhinitis: Secondary | ICD-10-CM | POA: Diagnosis not present

## 2022-09-11 ENCOUNTER — Other Ambulatory Visit: Payer: Self-pay | Admitting: Obstetrics and Gynecology

## 2022-09-11 DIAGNOSIS — R921 Mammographic calcification found on diagnostic imaging of breast: Secondary | ICD-10-CM

## 2022-09-14 DIAGNOSIS — M25511 Pain in right shoulder: Secondary | ICD-10-CM | POA: Diagnosis not present

## 2022-09-16 DIAGNOSIS — J3081 Allergic rhinitis due to animal (cat) (dog) hair and dander: Secondary | ICD-10-CM | POA: Diagnosis not present

## 2022-09-16 DIAGNOSIS — J301 Allergic rhinitis due to pollen: Secondary | ICD-10-CM | POA: Diagnosis not present

## 2022-09-16 DIAGNOSIS — J3089 Other allergic rhinitis: Secondary | ICD-10-CM | POA: Diagnosis not present

## 2022-09-23 DIAGNOSIS — M25511 Pain in right shoulder: Secondary | ICD-10-CM | POA: Diagnosis not present

## 2022-09-25 ENCOUNTER — Ambulatory Visit
Admission: RE | Admit: 2022-09-25 | Discharge: 2022-09-25 | Disposition: A | Discharge status: Home / Self Care | Payer: BLUE CROSS/BLUE SHIELD | Source: Ambulatory Visit | Attending: Obstetrics and Gynecology | Admitting: Obstetrics and Gynecology

## 2022-09-25 DIAGNOSIS — J301 Allergic rhinitis due to pollen: Secondary | ICD-10-CM | POA: Diagnosis not present

## 2022-09-25 DIAGNOSIS — J3089 Other allergic rhinitis: Secondary | ICD-10-CM | POA: Diagnosis not present

## 2022-09-25 DIAGNOSIS — J3081 Allergic rhinitis due to animal (cat) (dog) hair and dander: Secondary | ICD-10-CM | POA: Diagnosis not present

## 2022-09-25 DIAGNOSIS — R921 Mammographic calcification found on diagnostic imaging of breast: Secondary | ICD-10-CM

## 2022-10-01 DIAGNOSIS — F4320 Adjustment disorder, unspecified: Secondary | ICD-10-CM | POA: Diagnosis not present

## 2022-10-08 DIAGNOSIS — Z79890 Hormone replacement therapy (postmenopausal): Secondary | ICD-10-CM | POA: Diagnosis not present

## 2022-10-15 DIAGNOSIS — F4320 Adjustment disorder, unspecified: Secondary | ICD-10-CM | POA: Diagnosis not present

## 2022-10-16 DIAGNOSIS — J3089 Other allergic rhinitis: Secondary | ICD-10-CM | POA: Diagnosis not present

## 2022-10-16 DIAGNOSIS — J3081 Allergic rhinitis due to animal (cat) (dog) hair and dander: Secondary | ICD-10-CM | POA: Diagnosis not present

## 2022-10-16 DIAGNOSIS — J301 Allergic rhinitis due to pollen: Secondary | ICD-10-CM | POA: Diagnosis not present

## 2022-11-05 DIAGNOSIS — J3081 Allergic rhinitis due to animal (cat) (dog) hair and dander: Secondary | ICD-10-CM | POA: Diagnosis not present

## 2022-11-05 DIAGNOSIS — F4320 Adjustment disorder, unspecified: Secondary | ICD-10-CM | POA: Diagnosis not present

## 2022-11-05 DIAGNOSIS — J3089 Other allergic rhinitis: Secondary | ICD-10-CM | POA: Diagnosis not present

## 2022-11-05 DIAGNOSIS — J301 Allergic rhinitis due to pollen: Secondary | ICD-10-CM | POA: Diagnosis not present

## 2022-11-19 DIAGNOSIS — J3081 Allergic rhinitis due to animal (cat) (dog) hair and dander: Secondary | ICD-10-CM | POA: Diagnosis not present

## 2022-11-19 DIAGNOSIS — J3089 Other allergic rhinitis: Secondary | ICD-10-CM | POA: Diagnosis not present

## 2022-11-19 DIAGNOSIS — J301 Allergic rhinitis due to pollen: Secondary | ICD-10-CM | POA: Diagnosis not present

## 2022-11-26 DIAGNOSIS — F4320 Adjustment disorder, unspecified: Secondary | ICD-10-CM | POA: Diagnosis not present

## 2022-12-03 DIAGNOSIS — J3089 Other allergic rhinitis: Secondary | ICD-10-CM | POA: Diagnosis not present

## 2022-12-03 DIAGNOSIS — J301 Allergic rhinitis due to pollen: Secondary | ICD-10-CM | POA: Diagnosis not present

## 2022-12-03 DIAGNOSIS — J3081 Allergic rhinitis due to animal (cat) (dog) hair and dander: Secondary | ICD-10-CM | POA: Diagnosis not present

## 2022-12-12 DIAGNOSIS — J301 Allergic rhinitis due to pollen: Secondary | ICD-10-CM | POA: Diagnosis not present

## 2022-12-12 DIAGNOSIS — J3089 Other allergic rhinitis: Secondary | ICD-10-CM | POA: Diagnosis not present

## 2022-12-12 DIAGNOSIS — J3081 Allergic rhinitis due to animal (cat) (dog) hair and dander: Secondary | ICD-10-CM | POA: Diagnosis not present

## 2022-12-24 DIAGNOSIS — F4320 Adjustment disorder, unspecified: Secondary | ICD-10-CM | POA: Diagnosis not present

## 2022-12-25 DIAGNOSIS — J3081 Allergic rhinitis due to animal (cat) (dog) hair and dander: Secondary | ICD-10-CM | POA: Diagnosis not present

## 2022-12-25 DIAGNOSIS — J301 Allergic rhinitis due to pollen: Secondary | ICD-10-CM | POA: Diagnosis not present

## 2022-12-25 DIAGNOSIS — J3089 Other allergic rhinitis: Secondary | ICD-10-CM | POA: Diagnosis not present

## 2022-12-31 DIAGNOSIS — J3089 Other allergic rhinitis: Secondary | ICD-10-CM | POA: Diagnosis not present

## 2022-12-31 DIAGNOSIS — J3081 Allergic rhinitis due to animal (cat) (dog) hair and dander: Secondary | ICD-10-CM | POA: Diagnosis not present

## 2022-12-31 DIAGNOSIS — J301 Allergic rhinitis due to pollen: Secondary | ICD-10-CM | POA: Diagnosis not present

## 2023-01-03 DIAGNOSIS — J4531 Mild persistent asthma with (acute) exacerbation: Secondary | ICD-10-CM | POA: Diagnosis not present

## 2023-01-14 DIAGNOSIS — J209 Acute bronchitis, unspecified: Secondary | ICD-10-CM | POA: Diagnosis not present

## 2023-01-14 DIAGNOSIS — Z91010 Allergy to peanuts: Secondary | ICD-10-CM | POA: Diagnosis not present

## 2023-01-14 DIAGNOSIS — J4540 Moderate persistent asthma, uncomplicated: Secondary | ICD-10-CM | POA: Diagnosis not present

## 2023-01-14 DIAGNOSIS — J3081 Allergic rhinitis due to animal (cat) (dog) hair and dander: Secondary | ICD-10-CM | POA: Diagnosis not present

## 2023-01-14 DIAGNOSIS — J3089 Other allergic rhinitis: Secondary | ICD-10-CM | POA: Diagnosis not present

## 2023-01-14 DIAGNOSIS — J301 Allergic rhinitis due to pollen: Secondary | ICD-10-CM | POA: Diagnosis not present

## 2023-01-15 ENCOUNTER — Other Ambulatory Visit: Payer: Self-pay | Admitting: Allergy

## 2023-01-15 ENCOUNTER — Ambulatory Visit
Admission: RE | Admit: 2023-01-15 | Discharge: 2023-01-15 | Disposition: A | Discharge status: Home / Self Care | Payer: BC Managed Care – PPO | Source: Ambulatory Visit | Attending: Allergy | Admitting: Allergy

## 2023-01-15 DIAGNOSIS — R0602 Shortness of breath: Secondary | ICD-10-CM | POA: Diagnosis not present

## 2023-01-15 DIAGNOSIS — R059 Cough, unspecified: Secondary | ICD-10-CM | POA: Diagnosis not present

## 2023-01-15 DIAGNOSIS — J4540 Moderate persistent asthma, uncomplicated: Secondary | ICD-10-CM

## 2023-01-22 ENCOUNTER — Other Ambulatory Visit (HOSPITAL_COMMUNITY): Payer: Self-pay | Admitting: Family Medicine

## 2023-01-22 ENCOUNTER — Ambulatory Visit (HOSPITAL_COMMUNITY)
Admission: RE | Admit: 2023-01-22 | Discharge: 2023-01-22 | Disposition: A | Discharge status: Home / Self Care | Payer: BC Managed Care – PPO | Source: Ambulatory Visit | Attending: Family Medicine | Admitting: Family Medicine

## 2023-01-22 DIAGNOSIS — R0609 Other forms of dyspnea: Secondary | ICD-10-CM | POA: Diagnosis not present

## 2023-01-22 DIAGNOSIS — R064 Hyperventilation: Secondary | ICD-10-CM | POA: Diagnosis not present

## 2023-01-22 DIAGNOSIS — R5383 Other fatigue: Secondary | ICD-10-CM | POA: Diagnosis not present

## 2023-01-22 DIAGNOSIS — R0602 Shortness of breath: Secondary | ICD-10-CM

## 2023-01-22 LAB — POCT I-STAT CREATININE: Creatinine, Ser: 0.7 mg/dL (ref 0.44–1.00)

## 2023-01-22 MED ORDER — IOHEXOL 350 MG/ML SOLN
75.0000 mL | Freq: Once | INTRAVENOUS | Status: AC | PRN
  Administered 2023-01-22: 75 mL via INTRAVENOUS

## 2023-01-29 DIAGNOSIS — Z0289 Encounter for other administrative examinations: Secondary | ICD-10-CM

## 2023-02-04 DIAGNOSIS — J4540 Moderate persistent asthma, uncomplicated: Secondary | ICD-10-CM | POA: Diagnosis not present

## 2023-02-04 DIAGNOSIS — J309 Allergic rhinitis, unspecified: Secondary | ICD-10-CM | POA: Diagnosis not present

## 2023-02-05 ENCOUNTER — Encounter (INDEPENDENT_AMBULATORY_CARE_PROVIDER_SITE_OTHER): Payer: Self-pay | Admitting: Physician Assistant

## 2023-02-05 ENCOUNTER — Ambulatory Visit (INDEPENDENT_AMBULATORY_CARE_PROVIDER_SITE_OTHER): Payer: BC Managed Care – PPO | Admitting: Physician Assistant

## 2023-02-05 VITALS — BP 136/84 | HR 88 | Temp 98.4°F | Ht 64.0 in | Wt 257.0 lb

## 2023-02-05 DIAGNOSIS — R7303 Prediabetes: Secondary | ICD-10-CM

## 2023-02-05 DIAGNOSIS — I10 Essential (primary) hypertension: Secondary | ICD-10-CM

## 2023-02-05 DIAGNOSIS — T380X5A Adverse effect of glucocorticoids and synthetic analogues, initial encounter: Secondary | ICD-10-CM

## 2023-02-05 DIAGNOSIS — J4540 Moderate persistent asthma, uncomplicated: Secondary | ICD-10-CM

## 2023-02-05 DIAGNOSIS — Z6841 Body Mass Index (BMI) 40.0 and over, adult: Secondary | ICD-10-CM

## 2023-02-05 DIAGNOSIS — R739 Hyperglycemia, unspecified: Secondary | ICD-10-CM | POA: Diagnosis not present

## 2023-02-05 NOTE — Progress Notes (Signed)
Office: [phone]  /  Fax: [phone]   Initial Visit  Sylvia [name] was seen in clinic today to evaluate for obesity. She is interested in losing weight to improve overall health and reduce the risk of weight related complications. She presents today to review program treatment options, initial physical assessment, and evaluation.     She was referred by: PCP- DR. 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 She states: Adopt healthier eating patterns, Improve energy levels and physical activity, Improve existing medical conditions, Reduce number of medications, Improve quality of life, Improve appearance, Improve self-confidence, and Lose a target amount of weight : 25 lbs  Weight history: Struggling with weight for many years- now worsened by asthma-frequent steroid therapy and limited exercise tolerance.   When asked how has your weight affected you? She states: Has affected self-esteem, Contributed to medical problems, Contributed to orthopedic problems or mobility issues, Having fatigue, and Having poor endurance  Some associated conditions: Hypertension, Prediabetes, and Lung disease  Contributing factors: Family history, Medications, and Reduced physical activity  Weight promoting medications identified: Steroids  Current nutrition plan: None  Current level of physical activity: Walking  Current or previous pharmacotherapy: Metformin x 2 yrs.   Response to medication: Lost weight and was able to maintain weight loss Lost about 10 lbs after starting metformin and has maintained this loss.    Past medical history includes:   Past Medical History:  Diagnosis Date   Allergic rhinitis    Ankle fracture 10/2006   Right, Fracture of the posterior malleolus with intraarticular extension   Anxiety    Arthritis    Asthma    Decreased libido    Depression    GERD  (gastroesophageal reflux disease)    Hypertension    Low TSH level 02/10/2012   Migraines    Obesity    Pneumonia    Steroid-induced hyperglycemia 02/09/2012   Vaginal discharge      Objective:   BP 136/84   Pulse 88   Temp 98.4 F (36.9 C)   Ht 5\' 4"  (1.626 m)   Wt 257 lb (116.6 kg)   SpO2 98%   BMI 44.11 kg/m  She was weighed on the bioimpedance scale: Body mass index is 44.11 kg/m.  Peak Weight:269 lbs , Body Fat%:51.5 %, Visceral Fat Rating:17, Weight trend over the last 12 months: Decreasing  General:  Alert, oriented and cooperative. Patient is in no acute distress.  Respiratory: Normal respiratory effort, no problems with respiration noted   Gait: able to ambulate independently  Mental Status: Normal mood and affect. Normal behavior. Normal judgment and thought content.   DIAGNOSTIC DATA REVIEWED:  BMET    Component Value Date/Time   NA 140 06/11/2018 1140   K 3.9 06/11/2018 1140   CL 106 06/11/2018 1140   CO2 27 06/11/2018 1140   GLUCOSE 102 (H) 06/11/2018 1140   BUN 12 06/11/2018 1140   CREATININE 0.70 01/22/2023 1355   CALCIUM 8.8 (L) 06/11/2018 1140   GFRNONAA >60 06/11/2018 1140   GFRAA >60 06/11/2018 1140   Lab Results  Component Value Date   HGBA1C 5.2 02/09/2012   No results found for: "INSULIN" CBC    Component Value Date/Time   WBC 11.8 (H) 06/11/2018 1140   RBC 4.26 06/11/2018 1140   HGB 12.3 06/11/2018 1140   HCT 39.9 06/11/2018 1140   PLT 325 06/11/2018 1140  MCV 93.7 06/11/2018 1140   MCH 28.9 06/11/2018 1140   MCHC 30.8 06/11/2018 1140   RDW 14.4 06/11/2018 1140   Iron/TIBC/Ferritin/ %Sat No results found for: "IRON", "TIBC", "FERRITIN", "IRONPCTSAT" Lipid Panel  No results found for: "CHOL", "TRIG", "HDL", "CHOLHDL", "VLDL", "LDLCALC", "LDLDIRECT" Hepatic Function Panel     Component Value Date/Time   PROT 7.2 02/09/2012 0236   ALBUMIN 3.6 02/09/2012 0236   AST 13 02/09/2012 0236   ALT 10 02/09/2012 0236   ALKPHOS 81  02/09/2012 0236   BILITOT 0.2 (L) 02/09/2012 0236      Component Value Date/Time   TSH 0.273 (L) 02/09/2012 0236     Assessment and Plan:   Primary hypertension  Prediabetes  Steroid-induced hyperglycemia  Moderate persistent asthma, unspecified whether complicated  Class 3 severe obesity due to excess calories with serious comorbidity and body mass index (BMI) of 50.0 to 59.9 in adult [facility])        Obesity Treatment / Action Plan:  Patient will work on garnering support from family and friends to begin weight loss journey. Will work on eliminating or reducing the presence of highly palatable, calorie dense foods in the home. Will complete provided nutritional and psychosocial assessment questionnaire before the next appointment. Will be scheduled for indirect calorimetry to determine resting energy expenditure in a fasting state.  This will allow [place] to create a reduced calorie, high-protein meal plan to promote loss of fat mass while preserving muscle mass. Will avoid skipping meals which may result in increased hunger signals and overeating at certain times. Will work on managing stress via relaxation methods as this may result in unhealthy eating patterns. Counseled on the health benefits of losing 5%-15% of total body weight. Was counseled on nutritional approaches to weight loss and benefits of reducing processed foods and consuming plant-based foods and high quality protein as part of nutritional weight management. Was counseled on pharmacotherapy and role as an adjunct in weight management.   Obesity Education Performed Today:  She was weighed on the bioimpedance scale and results were discussed and documented in the synopsis.  We discussed obesity as a disease and the importance of a more detailed evaluation of all the factors contributing to the disease.  We discussed the importance of long term lifestyle changes which include nutrition, exercise and behavioral  modifications as well as the importance of customizing this to her specific health and social needs.  We discussed the benefits of reaching a healthier weight to alleviate the symptoms of existing conditions and reduce the risks of the biomechanical, metabolic and psychological effects of obesity.  MARGARETHE SCHOENBERGER appears to be in the action stage of change and states they are ready to start intensive lifestyle modifications and behavioral modifications.  30 minutes was spent today on this visit including the above counseling, pre-visit chart review, and post-visit documentation.  Reviewed by clinician on day of visit: allergies, medications, problem list, medical history, surgical history, family history, social history, and previous encounter notes pertinent to obesity diagnosis.   Clarissia Mckeen,PA-C

## 2023-02-13 DIAGNOSIS — J3089 Other allergic rhinitis: Secondary | ICD-10-CM | POA: Diagnosis not present

## 2023-02-13 DIAGNOSIS — J301 Allergic rhinitis due to pollen: Secondary | ICD-10-CM | POA: Diagnosis not present

## 2023-02-13 DIAGNOSIS — J3081 Allergic rhinitis due to animal (cat) (dog) hair and dander: Secondary | ICD-10-CM | POA: Diagnosis not present

## 2023-02-17 ENCOUNTER — Encounter: Payer: Self-pay | Admitting: Pulmonary Disease

## 2023-02-17 ENCOUNTER — Other Ambulatory Visit
Admission: RE | Admit: 2023-02-17 | Discharge: 2023-02-17 | Disposition: A | Discharge status: Home / Self Care | Payer: BC Managed Care – PPO | Source: Ambulatory Visit | Attending: Pulmonary Disease | Admitting: Pulmonary Disease

## 2023-02-17 ENCOUNTER — Ambulatory Visit: Payer: BC Managed Care – PPO | Admitting: Pulmonary Disease

## 2023-02-17 VITALS — BP 122/70 | HR 88 | Temp 97.9°F | Ht 64.0 in | Wt 262.0 lb

## 2023-02-17 DIAGNOSIS — J4550 Severe persistent asthma, uncomplicated: Secondary | ICD-10-CM | POA: Insufficient documentation

## 2023-02-17 LAB — CBC WITH DIFFERENTIAL/PLATELET
Abs Immature Granulocytes: 0.04 10*3/uL (ref 0.00–0.07)
Basophils Absolute: 0.1 10*3/uL (ref 0.0–0.1)
Basophils Relative: 1 %
Eosinophils Absolute: 0.2 10*3/uL (ref 0.0–0.5)
Eosinophils Relative: 2 %
HCT: 43.2 % (ref 36.0–46.0)
Hemoglobin: 13.8 g/dL (ref 12.0–15.0)
Immature Granulocytes: 0 %
Lymphocytes Relative: 23 %
Lymphs Abs: 2.4 10*3/uL (ref 0.7–4.0)
MCH: 30 pg (ref 26.0–34.0)
MCHC: 31.9 g/dL (ref 30.0–36.0)
MCV: 93.9 fL (ref 80.0–100.0)
Monocytes Absolute: 0.9 10*3/uL (ref 0.1–1.0)
Monocytes Relative: 9 %
Neutro Abs: 6.8 10*3/uL (ref 1.7–7.7)
Neutrophils Relative %: 65 %
Platelets: 354 10*3/uL (ref 150–400)
RBC: 4.6 MIL/uL (ref 3.87–5.11)
RDW: 13.5 % (ref 11.5–15.5)
WBC: 10.4 10*3/uL (ref 4.0–10.5)
nRBC: 0 % (ref 0.0–0.2)

## 2023-02-17 MED ORDER — AIRSUPRA 90-80 MCG/ACT IN AERO: 2.0000 | INHALATION_SPRAY | Freq: Four times a day (QID) | RESPIRATORY_TRACT | Status: DC | PRN

## 2023-02-17 MED ORDER — FLUTICASONE PROPIONATE 50 MCG/ACT NA SUSP: 1.0000 | Freq: Every day | NASAL | 0 refills | Status: AC

## 2023-02-17 NOTE — Progress Notes (Signed)
Synopsis: Referred in by Devra Dopp, MD   Subjective:   PATIENT ID: Sylvia [name] GENDER: female DOB: 1970/12/11, MRN: [id number]  Chief Complaint  Patient presents with   pulmonary consult    CT 7/18. Hx of asthma. C/o SOB with exertion and sinus pressure/congestion    HPI Sylvia [name] is a pleasant 52 year old female patient with a past medical history of severe persistent asthma on Trelegy presenting to the pulmonary office for worsening asthma control.  She reports that she was diagnosed with asthma at a young age and was hospitalized multiple times in the past but never required intubation.  Last being 6 to 7 years ago.  However about a month ago she started having shortness of breath chest tightness on a daily basis and had to use her rescue inhaler multiple times a day that was associated with coughing spells at night with wheezing.  She went to the urgent care clinic in July and was prescribed prednisone.  It did help however she was never back to baseline.  She went to her allergist Dr. [place] Callas who prescribed another round of prednisone with same response.  She denies any heartburn denies any acid taste in the mouth.  She does snore at night per her husband and feels fatigued during the day.  She denies any weight loss loss of appetite night sweats. She has a history of ectopic dermatitis and allergic rhinitis.    Family history: Daughter with asthma   Social history: Never smoker, denies alcohol use or illicit drug use. Worked as Air cabin crew. Does not have any pets at home.   ROS All systems were reviewed and are negative except for the above.  Objective:   Vitals:   02/17/23 0834  BP: 122/70  Pulse: 88  Temp: 97.9 F (36.6 C)  TempSrc: Temporal  SpO2: 95%  Weight: 262 lb (118.8 kg)  Height: 5\' 4"  (1.626 m)   95% on RA BMI Readings from Last 3 Encounters:  02/17/23 44.97 kg/m  02/05/23 44.11 kg/m  06/11/18 (P) 45.84 kg/m   Wt Readings from  Last 3 Encounters:  02/17/23 262 lb (118.8 kg)  02/05/23 257 lb (116.6 kg)  06/11/18 (P) 265 lb (120.2 kg)    Physical Exam GEN: NAD, Healthy Appearing HEENT: Supple Neck, Reactive Pupils, possible nasal polyp seen. CVS: Normal S1, Normal S2, RRR, No murmurs or ES appreciated  Lungs: Faint expiratory wheezing.  Abdomen: Soft, non tender, non distended, + BS  Extremities: Warm and well perfused, No edema  Skin: No suspicious lesions appreciated  Psych: Normal Affect  Ancillary Information   CBC    Component Value Date/Time   WBC 11.8 (H) 06/11/2018 1140   RBC 4.26 06/11/2018 1140   HGB 12.3 06/11/2018 1140   HCT 39.9 06/11/2018 1140   PLT 325 06/11/2018 1140   MCV 93.7 06/11/2018 1140   MCH 28.9 06/11/2018 1140   MCHC 30.8 06/11/2018 1140   RDW 14.4 06/11/2018 1140   LYMPHSABS 0.5 (L) 02/09/2012 0236   MONOABS 0.1 02/09/2012 0236   EOSABS 0.0 02/09/2012 0236   BASOSABS 0.0 02/09/2012 0236    Imaging  CXR 01/15/2023: No active cardiopulmonary disease CT Chest 01/22/2023: No acute intrathoracic findings.        No data to display           Assessment & Plan:  Sylvia [name] is a pleasant 52 year old female patient with a past medical history of severe persistent asthma on Trelegy presenting to the  pulmonary office for worsening asthma control.  #Severe Persistent Asthma, poorly controlled  ACT 12  []  PFT  []  Phenotyping with CBC/diff and Allergen panel with IgE  []  Given degrea of dyspnea will order an echocardiogram to evaluate cardiac function and PA pressures.  []  continue with Fluticasone-Umeclidinium-Vilanterol [Trelegy] 200-62.5-25mcg 1 puff daily.  []  Start with Albuterol-budesonide [airsupra] 90-59mcg 2puffs Q6H PRN for wheezing/sob/chest tightness  []  Will consider biologics (dupixent) pending on the above.  []  c/w good house care  inlcuding airpurifiers and dehumidifier.  []  C/w weightloss and healthy lifestyle.   #Allergic rhinitis with possible  polyps  []  Start Flonase ns 1 spray in each nares for 1 month  []  Avoid NSAIDs  []  Will consider Biologics as above  #High risk for OSA Per husband she does snore at night and has some apneic episodes. She is high risk for OSA.   []  In-lab polysomnography.   Return in about 4 weeks (around 03/17/2023).  I spent 60 minutes caring for this patient today, including preparing to see the patient, obtaining a medical history , reviewing a separately obtained history, performing a medically appropriate examination and/or evaluation, counseling and educating the patient/family/caregiver, ordering medications, tests, or procedures, documenting clinical information in the electronic health record, and independently interpreting results (not separately reported/billed) and communicating results to the patient/family/caregiver  Janann Colonel, MD [facility] 02/17/2023 10:04 AM

## 2023-02-18 DIAGNOSIS — J3081 Allergic rhinitis due to animal (cat) (dog) hair and dander: Secondary | ICD-10-CM | POA: Diagnosis not present

## 2023-02-18 DIAGNOSIS — J3089 Other allergic rhinitis: Secondary | ICD-10-CM | POA: Diagnosis not present

## 2023-02-18 DIAGNOSIS — J301 Allergic rhinitis due to pollen: Secondary | ICD-10-CM | POA: Diagnosis not present

## 2023-02-20 LAB — ALLERGEN PANEL (27) + IGE: Alternaria Alternata IgE: 2.28 kU/L — AB

## 2023-02-23 DIAGNOSIS — F4320 Adjustment disorder, unspecified: Secondary | ICD-10-CM | POA: Diagnosis not present

## 2023-02-27 NOTE — Telephone Encounter (Signed)
Dr. Larinda Buttery, please advise. Thanks

## 2023-03-02 DIAGNOSIS — F4320 Adjustment disorder, unspecified: Secondary | ICD-10-CM | POA: Diagnosis not present

## 2023-03-04 DIAGNOSIS — J301 Allergic rhinitis due to pollen: Secondary | ICD-10-CM | POA: Diagnosis not present

## 2023-03-04 DIAGNOSIS — J3081 Allergic rhinitis due to animal (cat) (dog) hair and dander: Secondary | ICD-10-CM | POA: Diagnosis not present

## 2023-03-04 DIAGNOSIS — J3089 Other allergic rhinitis: Secondary | ICD-10-CM | POA: Diagnosis not present

## 2023-03-06 ENCOUNTER — Ambulatory Visit
Admission: RE | Admit: 2023-03-06 | Discharge: 2023-03-06 | Disposition: A | Discharge status: Home / Self Care | Payer: BC Managed Care – PPO | Source: Ambulatory Visit | Attending: Pulmonary Disease | Admitting: Pulmonary Disease

## 2023-03-06 DIAGNOSIS — J4550 Severe persistent asthma, uncomplicated: Secondary | ICD-10-CM | POA: Insufficient documentation

## 2023-03-06 DIAGNOSIS — I10 Essential (primary) hypertension: Secondary | ICD-10-CM | POA: Diagnosis not present

## 2023-03-06 DIAGNOSIS — R0609 Other forms of dyspnea: Secondary | ICD-10-CM

## 2023-03-06 DIAGNOSIS — I340 Nonrheumatic mitral (valve) insufficiency: Secondary | ICD-10-CM | POA: Insufficient documentation

## 2023-03-06 DIAGNOSIS — R0600 Dyspnea, unspecified: Secondary | ICD-10-CM | POA: Insufficient documentation

## 2023-03-06 LAB — ECHOCARDIOGRAM COMPLETE
AR max vel: 2.1 cm2
AV Area VTI: 2.69 cm2
AV Area mean vel: 2.13 cm2
AV Mean grad: 4 mmHg
AV Peak grad: 6.7 mmHg
Ao pk vel: 1.29 m/s
Area-P 1/2: 4.39 cm2
MV VTI: 2.41 cm2
S' Lateral: 2.3 cm

## 2023-03-06 NOTE — Progress Notes (Signed)
*  PRELIMINARY RESULTS* Echocardiogram 2D Echocardiogram has been performed.  [name] 03/06/2023, 9:48 AM

## 2023-03-12 ENCOUNTER — Ambulatory Visit: Payer: BC Managed Care – PPO | Attending: Pulmonary Disease

## 2023-03-12 DIAGNOSIS — J4550 Severe persistent asthma, uncomplicated: Secondary | ICD-10-CM | POA: Diagnosis not present

## 2023-03-12 DIAGNOSIS — J3089 Other allergic rhinitis: Secondary | ICD-10-CM | POA: Diagnosis not present

## 2023-03-12 DIAGNOSIS — J3081 Allergic rhinitis due to animal (cat) (dog) hair and dander: Secondary | ICD-10-CM | POA: Diagnosis not present

## 2023-03-12 DIAGNOSIS — J301 Allergic rhinitis due to pollen: Secondary | ICD-10-CM | POA: Diagnosis not present

## 2023-03-12 LAB — PULMONARY FUNCTION TEST ARMC ONLY
DL/VA % pred: 118 %
DL/VA: 5.07 ml/min/mmHg/L
DLCO unc % pred: 82 %
DLCO unc: 17.37 ml/min/mmHg
FEF 25-75 Post: 2.12 L/s
FEF 25-75 Pre: 2.49 L/s
FEF2575-%Change-Post: -14 %
FEF2575-%Pred-Post: 77 %
FEF2575-%Pred-Pre: 91 %
FEV1-%Change-Post: -4 %
FEV1-%Pred-Post: 72 %
FEV1-%Pred-Pre: 75 %
FEV1-Post: 2.01 L
FEV1-Pre: 2.09 L
FEV1FVC-%Change-Post: 3 %
FEV1FVC-%Pred-Pre: 104 %
FEV6-%Change-Post: -7 %
FEV6-%Pred-Post: 67 %
FEV6-%Pred-Pre: 73 %
FEV6-Post: 2.31 L
FEV6-Pre: 2.5 L
FEV6FVC-%Change-Post: 0 %
FEV6FVC-%Pred-Post: 102 %
FEV6FVC-%Pred-Pre: 102 %
FVC-%Change-Post: -7 %
FVC-%Pred-Post: 65 %
FVC-%Pred-Pre: 71 %
FVC-Post: 2.32 L
FVC-Pre: 2.5 L
Post FEV1/FVC ratio: 87 %
Post FEV6/FVC ratio: 100 %
Pre FEV1/FVC ratio: 84 %
Pre FEV6/FVC Ratio: 100 %
RV % pred: 90 %
RV: 1.64 L
TLC % pred: 79 %
TLC: 4.01 L

## 2023-03-12 MED ORDER — ALBUTEROL SULFATE (2.5 MG/3ML) 0.083% IN NEBU
2.5000 mg | INHALATION_SOLUTION | Freq: Once | RESPIRATORY_TRACT | Status: AC
  Administered 2023-03-12: 2.5 mg via RESPIRATORY_TRACT

## 2023-03-16 DIAGNOSIS — F4320 Adjustment disorder, unspecified: Secondary | ICD-10-CM | POA: Diagnosis not present

## 2023-03-19 DIAGNOSIS — J3089 Other allergic rhinitis: Secondary | ICD-10-CM | POA: Diagnosis not present

## 2023-03-19 DIAGNOSIS — J301 Allergic rhinitis due to pollen: Secondary | ICD-10-CM | POA: Diagnosis not present

## 2023-03-19 DIAGNOSIS — J3081 Allergic rhinitis due to animal (cat) (dog) hair and dander: Secondary | ICD-10-CM | POA: Diagnosis not present

## 2023-03-24 ENCOUNTER — Encounter (INDEPENDENT_AMBULATORY_CARE_PROVIDER_SITE_OTHER): Payer: Self-pay | Admitting: Family Medicine

## 2023-03-24 ENCOUNTER — Ambulatory Visit: Payer: BC Managed Care – PPO | Attending: Otolaryngology

## 2023-03-24 ENCOUNTER — Ambulatory Visit (INDEPENDENT_AMBULATORY_CARE_PROVIDER_SITE_OTHER): Payer: BC Managed Care – PPO | Admitting: Family Medicine

## 2023-03-24 VITALS — BP 119/75 | HR 87 | Temp 98.7°F | Ht 64.0 in | Wt 254.0 lb

## 2023-03-24 DIAGNOSIS — J4540 Moderate persistent asthma, uncomplicated: Secondary | ICD-10-CM

## 2023-03-24 DIAGNOSIS — R0683 Snoring: Secondary | ICD-10-CM | POA: Insufficient documentation

## 2023-03-24 DIAGNOSIS — R7989 Other specified abnormal findings of blood chemistry: Secondary | ICD-10-CM | POA: Diagnosis not present

## 2023-03-24 DIAGNOSIS — Z6841 Body Mass Index (BMI) 40.0 and over, adult: Secondary | ICD-10-CM

## 2023-03-24 DIAGNOSIS — F5089 Other specified eating disorder: Secondary | ICD-10-CM

## 2023-03-24 DIAGNOSIS — Z1331 Encounter for screening for depression: Secondary | ICD-10-CM

## 2023-03-24 DIAGNOSIS — R0609 Other forms of dyspnea: Secondary | ICD-10-CM | POA: Diagnosis not present

## 2023-03-24 DIAGNOSIS — R5383 Other fatigue: Secondary | ICD-10-CM

## 2023-03-24 DIAGNOSIS — G4733 Obstructive sleep apnea (adult) (pediatric): Secondary | ICD-10-CM | POA: Insufficient documentation

## 2023-03-24 DIAGNOSIS — I10 Essential (primary) hypertension: Secondary | ICD-10-CM | POA: Diagnosis not present

## 2023-03-24 DIAGNOSIS — E559 Vitamin D deficiency, unspecified: Secondary | ICD-10-CM | POA: Diagnosis not present

## 2023-03-24 DIAGNOSIS — R7303 Prediabetes: Secondary | ICD-10-CM

## 2023-03-24 DIAGNOSIS — E668 Other obesity: Secondary | ICD-10-CM | POA: Diagnosis not present

## 2023-03-24 DIAGNOSIS — E119 Type 2 diabetes mellitus without complications: Secondary | ICD-10-CM | POA: Diagnosis not present

## 2023-03-24 DIAGNOSIS — F39 Unspecified mood [affective] disorder: Secondary | ICD-10-CM

## 2023-03-24 NOTE — Progress Notes (Unsigned)
Carlye Grippe, D.O.  ABFM, ABOM Specializing in Clinical Bariatric Medicine Office located at: [address]  [place], [place]  [address]     Bariatric Medicine Visit  Dear Sigmund Hazel, MD  Thank you for referring Sylvia [name] to our clinic today for evaluation.  We performed a consultation to discuss her options for treatment and educate the patient on her disease state.  The following note includes my evaluation and treatment recommendations.   Please do not hesitate to reach out to me directly if you have any further concerns.   Assessment and Plan:   Orders Placed This Encounter  Procedures   Vitamin B12   Comprehensive metabolic panel   Folate   Hemoglobin A1c   Insulin, random   Lipid Panel With LDL/HDL Ratio   VITAMIN D 25 Hydroxy (Vit-D Deficiency, Fractures)   TSH   T4, free   T3   EKG 12-Lead    Medications Discontinued During This Encounter  Medication Reason   Albuterol-Budesonide (AIRSUPRA) 90-80 MCG/ACT AERO Patient Preference   ibuprofen (ADVIL,MOTRIN) 600 MG tablet Patient Preference     Fatigue Assessment & Plan: Sylvia [name] does feel that her weight is causing her energy to be lower than it should be. Fatigue may be related to Sylvia, depression or many other causes. she does not appear to have any red flag symptoms and this appears to most likely be related to her current lifestyle habits and dietary intake.  Labs will be ordered and reviewed with her at their next office visit in two weeks.  Epworth sleepiness scale is 11 and appears to not be within normal limits.  Sylvia [name] admits to some daytime somnolence and admits to waking up still tired. Patient has a history of morning headaches (up to 2x a wk)  Sylvia [name] generally gets 6 hours of sleep per night, and states that she has generally unrestful sleep. Snoring is present. Pt reports that she has stopped breathing in her sleep in the past.   Discussed the importance of 7-9 hrs of sleep for  optimal weight loss and overall health. Sylvia [name] endorses that she has a sleep study scheduled for tonight. She will c/w all recommendations per PCP  & sleep medicine doctor.   ECG: Performed and reviewed/ interpreted independently.  Normal sinus rhythm, rate 90 bpm; reassuring without any acute abnormalities, will continue to monitor for symptoms.   Shortness of breath on exertion Assessment & Plan: Shayna does feel that she gets out of breath more easily than she used to when she exercises and seems to be worsening over time with weight gain.  This has gotten worse recently. Aaradhya denies shortness of breath at rest or orthopnea. Sylvia [name]'s shortness of breath appears to be Sylvia related and exercise induced, as they do not appear to have any "red flag" symptoms/ concerns today.  Also, this condition appears to be related to a state of poor cardiovascular conditioning   Obtain labs today and will be reviewed with her at their next office visit in two weeks.  Indirect Calorimeter completed today to help guide our dietary regimen. It shows a VO2 of 219 and a REE of 1512.  Her calculated basal metabolic rate is 9563 thus her measured basal metabolic rate is worse than expected.  Patient agreed to work on weight loss at this time.  As Sylvia [name] progresses through our weight loss program, we will gradually increase exercise as tolerated to treat her current condition.   If Sylvia [name] follows our  recommendations and loses 5-10% of their weight without improvement of her shortness of breath or if at any time, symptoms become more concerning, they agree to urgently follow up with their PCP/ specialist for further consideration/ evaluation.   Sylvia [name] verbalizes agreement with this plan.    Mood disorder (HCC) - emotional eating/ Modified PHQ-9 Depression Screen:  Assessment & Plan: Her Food and Mood (modified PHQ-9) score was 20. Denies any SI/HI. Mood is stable. Reports never being on a  mood medication. Pt typically  sees a therapist every other wk. Additionally, pt tends to eat when stressed, sad, and as a reward.   Continue meeting with therapist. Patient was referred to Dr. Dewaine Conger, our Bariatric Psychologist, for evaluation due to her elevated PHQ-9 score and  struggles with emotional eating. Will begin to monitor condition.    Primary hypertension Assessment & Plan: BP Readings from Last 3 Encounters:  03/24/23 119/75  02/17/23 122/70  02/05/23 136/84   HTN treated with Norvasc 10 mg daily. Blood pressure is stable today - no concerns in this regard.  Begin Prudent nutritional plan and low sodium diet, advance exercise as tolerated.  We will begin to monitor closely alongside PCP/ specialists.   Prediabetes Assessment & Plan: Lab Results  Component Value Date   HGBA1C 5.2 02/09/2012    Prediabetes treated with Metformin 500 mg bid - pt tolerating medication well, denies any N/V/D. C/w Metformin per PCP/specialist. Begin prescribed nutritional plan. Will check labs today.   Moderate persistent asthma, unspecified whether complicated Assessment & Plan: Condition is stable and treated with Montelukast 10 mg daily, Trelegy once daily, & Levalbuterol prn. C/w all medications and treatment per PCP/ pulmonologist.    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 Fat mass is 128.4 lbs. Total body water is 89 lb. Laelia will work on healthier eating habits and try their best to follow the Category 1 meal plan. The Pescatarain plan was also provided as a guide.    Behavioral Intervention Additional resources provided today: category 1 meal plan information and Pescatarian Plan Evidence-based interventions for health behavior change were utilized today including the discussion of self monitoring techniques, problem-solving barriers and SMART goal setting techniques.    Regarding patient's less desirable eating habits and patterns, we employed the technique of small changes.  Pt will specifically work on: beginning prescribed meal plan for next visit.    FOLLOW UP: Follow up in 2 weeks. She was informed of the importance of frequent follow up visits to maximize her success with intensive lifestyle modifications for her multiple health conditions.  Sylvia [name] is aware that we will review all of her lab results at our next visit.  She is aware that if anything is critical/ life threatening with the results, we will be contacting her via MyChart prior to the office visit to discuss management.    Chief Complaint:   Sylvia [name] (MR# [id number]) is a pleasant 52 y.o. female who presents for evaluation and treatment of Sylvia and related comorbidities. Current BMI is Body mass index is 43.6 kg/m. Sylvia [name] has been struggling with her weight for many years and has been unsuccessful in either losing weight, maintaining weight loss, or reaching her healthy weight goal.  Sylvia [name] is currently in the action stage of change and ready to dedicate time achieving and  maintaining a healthier weight. Sylvia [name] is interested in becoming our patient and working on intensive lifestyle modifications including (but not limited to) diet and exercise for weight loss.  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 Patient is married to Sylvia [name]  and has 1 child. She lives with Sylvia [name] and her 74 y.o daughter Sylvia [name].   Desired weight is 200-225 lbs in 6 to 12 months.  Has tried LA weight loss - lost 25 lbs and kept off weight for 2 yrs.  She enjoyed LA weight loss because she "could eat much of what I wanted, had shakes and bars and someone to talk to".   Eats outside the home once a week.   Craves chocolate, bread, & breakfast foods in the mornings.   Snacks on popcorn & small ice cream  bars.   Hardly drinks any SSBs.   Reports trying to be Vegetarian.   Worst food habit: "not enough proteins like sweet treats for breakfast & ice-cream"   Subjective:   This is the patient's first visit at [facility].  The patient's NEW PATIENT PACKET that they filled out prior to today's office visit was reviewed at length and information from that paperwork was included within the following office visit note.    Included in the packet: current and past health history, medications, allergies, ROS, gynecologic history (women only), surgical history, family history, social history, weight history, weight loss surgery history (for those that have had weight loss surgery), nutritional evaluation, mood and food questionnaire along with a depression screening (PHQ9) on all patients, an Epworth questionnaire, sleep habits questionnaire, patient life and health improvement goals questionnaire. These will all be scanned into the patient's chart under the "media" tab.   Review of Systems: Please refer to new patient packet scanned into media. Pertinent positives were addressed with patient today.  Reviewed by clinician on day of visit: allergies, medications, problem list, medical history, surgical history, family history, social history, and previous encounter notes.  During the visit, I independently reviewed the patient's EKG, bioimpedance scale results, and indirect calorimeter results. I used this information to tailor a meal plan for the patient that will help Sylvia [name] to lose weight and will improve her Sylvia-related conditions going forward.  I performed a medically necessary appropriate examination and/or evaluation. I discussed the assessment and treatment plan with the patient. The patient was provided an opportunity to ask questions and all were answered. The patient agreed with the plan and demonstrated an understanding of the instructions. Labs were ordered today  (unless patient declined them) and will be reviewed with the patient at our next visit unless more critical results need to be addressed immediately. Clinical information was updated and documented in the EMR.   Objective:   PHYSICAL EXAM: Blood pressure 119/75, pulse 87, temperature 98.7 F (37.1 C), height 5\' 4"  (1.626 m), weight 254 lb (115.2 kg), last menstrual period 05/24/2018, SpO2 97%. Body mass index is 43.6 kg/m. General: Well Developed, well nourished, and in no acute distress.  HEENT: Normocephalic, atraumatic Skin: Warm and dry, cap RF less 2 sec, good turgor Chest:  Normal excursion, shape, no gross abn Respiratory: speaking in full sentences, no conversational dyspnea NeuroM-Sk: Ambulates w/o assistance, moves * 4 Psych: A and O *3, insight good, mood-full  Anthropometric Measurements Height: 5\' 4"  (1.626 m) Weight: 254 lb (115.2 kg) BMI (Calculated): 43.58 Starting Weight: 254 lb Peak Weight:  279 lb Waist Measurement : 48 inches   Body Composition  Body Fat %: 50.4 % Fat Mass (lbs): 128.4 lbs Muscle Mass (lbs): 119.8 lbs Total Body Water (lbs): 89 lbs Visceral Fat Rating : 16   Other Clinical Data RMR: 1512 Fasting: yes Labs: yes Today's Visit #: 1   DIAGNOSTIC DATA REVIEWED:  BMET    Component Value Date/Time   NA 140 06/11/2018 1140   K 3.9 06/11/2018 1140   CL 106 06/11/2018 1140   CO2 27 06/11/2018 1140   GLUCOSE 102 (H) 06/11/2018 1140   BUN 12 06/11/2018 1140   CREATININE 0.70 01/22/2023 1355   CALCIUM 8.8 (L) 06/11/2018 1140   GFRNONAA >60 06/11/2018 1140   GFRAA >60 06/11/2018 1140   Lab Results  Component Value Date   HGBA1C 5.2 02/09/2012   No results found for: "INSULIN" Lab Results  Component Value Date   TSH 0.273 (L) 02/09/2012   CBC    Component Value Date/Time   WBC 10.4 02/17/2023 0937   RBC 4.60 02/17/2023 0937   HGB 13.8 02/17/2023 0937   HCT 43.2 02/17/2023 0937   PLT 354 02/17/2023 0937   MCV 93.9 02/17/2023  0937   MCH 30.0 02/17/2023 0937   MCHC 31.9 02/17/2023 0937   RDW 13.5 02/17/2023 0937   Iron Studies No results found for: "IRON", "TIBC", "FERRITIN", "IRONPCTSAT" Lipid Panel  No results found for: "CHOL", "TRIG", "HDL", "CHOLHDL", "VLDL", "LDLCALC", "LDLDIRECT" Hepatic Function Panel     Component Value Date/Time   PROT 7.2 02/09/2012 0236   ALBUMIN 3.6 02/09/2012 0236   AST 13 02/09/2012 0236   ALT 10 02/09/2012 0236   ALKPHOS 81 02/09/2012 0236   BILITOT 0.2 (L) 02/09/2012 0236      Component Value Date/Time   TSH 0.273 (L) 02/09/2012 0236   Nutritional No results found for: "VD25OH"  Attestation Statements:   I, Sylvia [name], acting as a Stage manager for [employer], DO., have compiled all relevant documentation for today's office visit on behalf of Thomasene Lot, DO, while in the presence of [employer], DO.  Time spent on visit including pre-visit chart review and post-visit care was estimated to be 60  minutes. Over 50% of the time was spent in direct face to face counseling and coordination of care.  I have reviewed the above documentation for accuracy and completeness, and I agree with the above. Carlye Grippe, D.O.  The 21st Century Cures Act was signed into law in 2016 which includes the topic of electronic health records.  This provides immediate access to information in MyChart.  This includes consultation notes, operative notes, office notes, lab results and pathology reports.  If you have any questions about what you read please let us know at your next visit so we can discuss your concerns and take corrective action if need be.  We are right here with you.

## 2023-03-25 ENCOUNTER — Institutional Professional Consult (permissible substitution): Payer: BC Managed Care – PPO | Admitting: Pulmonary Disease

## 2023-03-26 ENCOUNTER — Ambulatory Visit: Payer: BC Managed Care – PPO | Admitting: Pulmonary Disease

## 2023-03-26 ENCOUNTER — Encounter: Payer: Self-pay | Admitting: Pulmonary Disease

## 2023-03-26 ENCOUNTER — Telehealth: Payer: Self-pay | Admitting: Pharmacist

## 2023-03-26 ENCOUNTER — Telehealth: Payer: Self-pay

## 2023-03-26 VITALS — BP 132/86 | HR 103 | Temp 97.6°F | Ht 64.0 in | Wt 258.0 lb

## 2023-03-26 DIAGNOSIS — J3081 Allergic rhinitis due to animal (cat) (dog) hair and dander: Secondary | ICD-10-CM | POA: Diagnosis not present

## 2023-03-26 DIAGNOSIS — J4550 Severe persistent asthma, uncomplicated: Secondary | ICD-10-CM

## 2023-03-26 DIAGNOSIS — J301 Allergic rhinitis due to pollen: Secondary | ICD-10-CM | POA: Diagnosis not present

## 2023-03-26 DIAGNOSIS — J3089 Other allergic rhinitis: Secondary | ICD-10-CM | POA: Diagnosis not present

## 2023-03-26 NOTE — Telephone Encounter (Signed)
Dupixent forms received. Will start Dupixent BIV. Thanks!

## 2023-03-26 NOTE — Progress Notes (Signed)
Synopsis: Referred in by Sylvia Hazel, MD   Subjective:   PATIENT ID: Sylvia [name] GENDER: female DOB: 19-May-1971, MRN: [id number]  Chief Complaint  Patient presents with   Follow-up    Occasional shortness of breath with exertion. Patient is using flonase daily. She reports persistent nasal congestion in the morning.     HPI Sylvia [name] is a pleasant 52 year old female patient with a past medical history of severe persistent asthma on Trelegy presenting to the pulmonary office for worsening asthma control.  She reports that she was diagnosed with asthma at a young age and was hospitalized multiple times in the past but never required intubation.  Last being 6 to 7 years ago.  About a month prior to presentation she started having shortness of breath chest tightness on a daily basis and had to use her rescue inhaler multiple times a day that was associated with coughing spells at night with wheezing.  She went to the urgent care clinic in July and was prescribed prednisone.  It did help however she was never back to baseline.  She went to her [name] Sylvia [name] who prescribed another round of prednisone with same response.  She denies any heartburn denies any acid taste in the mouth.  She does snore at night per her husband and feels fatigued during the day.  She denies any weight loss loss of appetite night sweats. She has a history of ectopic dermatitis and allergic rhinitis.    In the interim, she underwent PFTs that showed mild restriction with normal DLCO. Her Eosinophil count was 200 and IgE total count was a 1000. Antigen panel was + for multiple antigens highest for Dog Dander. She also underwent a sleep study on 09/18 pending read.   Family history: Daughter with asthma   Social history: Never smoker, denies alcohol use or illicit drug use. Worked as Air cabin crew. Does not have any pets at home.   ROS All systems were reviewed and are negative except for the  above.  Objective:   Vitals:   03/26/23 0933  BP: (!) 160/96  Pulse: (!) 103  Temp: 97.6 F (36.4 C)  TempSrc: Temporal  SpO2: 98%  Weight: 258 lb (117 kg)  Height: 5\' 4"  (1.626 m)   98% on RA BMI Readings from Last 3 Encounters:  03/26/23 44.29 kg/m  03/24/23 43.60 kg/m  02/17/23 44.97 kg/m   Wt Readings from Last 3 Encounters:  03/26/23 258 lb (117 kg)  03/24/23 254 lb (115.2 kg)  02/17/23 262 lb (118.8 kg)    Physical Exam GEN: NAD, Healthy Appearing HEENT: Supple Neck, Reactive Pupils, possible nasal polyp seen. CVS: Normal S1, Normal S2, RRR, No murmurs or ES appreciated  Lungs: Faint expiratory wheezing.  Abdomen: Soft, non tender, non distended, + BS  Extremities: Warm and well perfused, No edema  Skin: No suspicious lesions appreciated  Psych: Normal Affect  Ancillary Information   CBC    Component Value Date/Time   WBC 10.4 02/17/2023 0937   RBC 4.60 02/17/2023 0937   HGB 13.8 02/17/2023 0937   HCT 43.2 02/17/2023 0937   PLT 354 02/17/2023 0937   MCV 93.9 02/17/2023 0937   MCH 30.0 02/17/2023 0937   MCHC 31.9 02/17/2023 0937   RDW 13.5 02/17/2023 0937   LYMPHSABS 2.4 02/17/2023 0937   MONOABS 0.9 02/17/2023 0937   EOSABS 0.2 02/17/2023 0937   BASOSABS 0.1 02/17/2023 0937    Imaging  CXR 01/15/2023: No active cardiopulmonary disease  CTA Chest 01/22/2023: No acute intrathoracic findings.    Echocardiogram 03/06/23  1. Left ventricular ejection fraction, by estimation, is 60 to 65%. The  left ventricle has normal function. The left ventricle has no regional  wall motion abnormalities. Left ventricular diastolic parameters were  normal.   2. Right ventricular systolic function is normal. The right ventricular  size is normal. There is normal pulmonary artery systolic pressure. The  estimated right ventricular systolic pressure is 19.1 mmHg.   3. The mitral valve is normal in structure. Mild mitral valve  regurgitation. No evidence of  mitral stenosis.   4. The aortic valve is normal in structure. Aortic valve regurgitation is  not visualized. Aortic valve sclerosis is present, with no evidence of  aortic valve stenosis.   5. The inferior vena cava is normal in size with greater than 50%  respiratory variability, suggesting right atrial pressure of 3 mmHg.     Latest Ref Rng & Units 03/12/2023    8:13 AM  PFT Results  FVC-Pre L 2.50   FVC-Predicted Pre % 71   FVC-Post L 2.32   FVC-Predicted Post % 65   Pre FEV1/FVC % % 84   Post FEV1/FCV % % 87   FEV1-Pre L 2.09   FEV1-Predicted Pre % 75   FEV1-Post L 2.01   DLCO uncorrected ml/min/mmHg 17.37   DLCO UNC% % 82   DLVA Predicted % 118   TLC L 4.01   TLC % Predicted % 79   RV % Predicted % 90      Assessment & Plan:  Sylvia [name] is a pleasant 52 year old female patient with a past medical history of severe persistent asthma on Trelegy presenting to the pulmonary office for worsening asthma control.  #Severe Persistent Asthma, poorly controlled  ACT 12  IgE 1000.  Eos 200  []  continue with Fluticasone-Umeclidinium-Vilanterol [Trelegy] 200-62.5-25mcg 1 puff daily.  []  c/w with Albuterol-budesonide [airsupra] 90-66mcg 2puffs Q6H PRN for wheezing/sob/chest tightness  []  Will start w/ Dupixent Therapy (Discussed with Dr. Ridott [name] [name]) []  c/w good house care  inlcuding airpurifiers and dehumidifier.  []  C/w weightloss and healthy lifestyle.   #Allergic rhinitis with possible polyps  []  Start Flonase ns 1 spray in each nares for 1 month  []  Avoid NSAIDs  []  Will consider Biologics as above  #High risk for OSA Per husband she does snore at night and has some apneic episodes. She is high risk for OSA.   []  Pending In-lab polysomnography.   No follow-ups on file.  I spent 60 minutes caring for this patient today, including preparing to see the patient, obtaining a medical history , reviewing a separately obtained history, performing a medically  appropriate examination and/or evaluation, counseling and educating the patient/family/caregiver, ordering medications, tests, or procedures, documenting clinical information in the electronic health record, and independently interpreting results (not separately reported/billed) and communicating results to the patient/family/caregiver  Janann Colonel, MD [facility] 03/26/2023 9:37 AM

## 2023-03-26 NOTE — Telephone Encounter (Signed)
Please start Dupixent BIV  Dose : 600mg  SQ at Week 0 then 300mg  SQ every 14 days  PAP placed in PAP pending info folder in pharmacy office  Chesley Mires, PharmD, MPH, BCPS, CPP Clinical Pharmacist (Rheumatology and Pulmonology)

## 2023-03-26 NOTE — Telephone Encounter (Signed)
Dupixent enrollment form completed and faxed to [facility] pharmacy team.

## 2023-03-27 ENCOUNTER — Other Ambulatory Visit (HOSPITAL_COMMUNITY): Payer: Self-pay

## 2023-03-27 NOTE — Telephone Encounter (Signed)
Submitted a Prior Authorization request to [employer] for DUPIXENT via CoverMyMeds. Will update once we receive a response.  Key: [id number]

## 2023-03-27 NOTE — Telephone Encounter (Signed)
Received notification from [employer] regarding a prior authorization for DUPIXENT. Authorization has been APPROVED from 02/25/23 to 09/23/23.   Unable to run test claim because patient must fill through [facility]: [phone]  Authorization # [id number]  Patient will need to be enrolled into Dupixent copay card  Chesley Mires, PharmD, MPH, BCPS, CPP Clinical Pharmacist (Rheumatology and Pulmonology)

## 2023-03-30 DIAGNOSIS — F4320 Adjustment disorder, unspecified: Secondary | ICD-10-CM | POA: Diagnosis not present

## 2023-03-31 ENCOUNTER — Telehealth (HOSPITAL_BASED_OUTPATIENT_CLINIC_OR_DEPARTMENT_OTHER): Payer: BC Managed Care – PPO | Admitting: Pulmonary Disease

## 2023-03-31 DIAGNOSIS — G4733 Obstructive sleep apnea (adult) (pediatric): Secondary | ICD-10-CM

## 2023-03-31 NOTE — Telephone Encounter (Signed)
Split study showed mod  OSA with AHI 15/ hr -corrected by CPAP 8 cm with med nasal pillows mask

## 2023-04-01 ENCOUNTER — Telehealth: Payer: Self-pay | Admitting: Pulmonary Disease

## 2023-04-01 NOTE — Telephone Encounter (Signed)
Per Dr. Larinda Buttery verbally- start cpap 8cm h2O.  Lm for patient.

## 2023-04-01 NOTE — Telephone Encounter (Signed)
Will await Dr. Candis Musa response.   Would you like to proceed with cpap 8cm?

## 2023-04-01 NOTE — Telephone Encounter (Signed)
See last signed encounter. PT OK w/moving fwd w/CPAP. Please call to advise next steps w/CPAP order.  Also, Dr. Mervyn Skeeters said he was going to talk to her allergy Dr. about injections. She is checking on the status of that as well.  Her # is [phone] Best time after 1:00.

## 2023-04-02 NOTE — Telephone Encounter (Signed)
Enrolled patient into Dupixent copay card (activated card via phone call): ID: [id number] Group: [id number] PCN: [id number] BIN: [id number]  Phone: [phone]  Called patient to see if she's available tomorrow afternoon @ 3pm to get started on Dupixent @ [facility] since I will happen to be there. She requested call back in an hour so she can check her schedule  Chesley Mires, PharmD, MPH, BCPS, CPP Clinical Pharmacist (Rheumatology and Pulmonology)

## 2023-04-02 NOTE — Telephone Encounter (Signed)
Patient scheduled for Dupixent new start on 04/03/23 @ 3:30pm  Sylvia [name], PharmD, MPH, BCPS, CPP Clinical Pharmacist (Rheumatology and Pulmonology)

## 2023-04-02 NOTE — Addendum Note (Signed)
Addended by: Lajoyce Lauber A on: 04/02/2023 08:57 AM   Modules accepted: Orders

## 2023-04-02 NOTE — Telephone Encounter (Signed)
Spoke to patient. She agrees with cpap.  Order has been placed. She will call back to schedule appt once setup on cpap.  She is also requesting an update on allergy shots. She stated that Dr. Larinda Buttery was going to discuss this with allergist.  Patient mentioned that she is having to use her albuterol inhaler every morning and at night before bedtime to prevent wheezing and chest tightness.   Dr. Larinda Buttery, please advise. Thanks

## 2023-04-02 NOTE — Telephone Encounter (Signed)
Pharmacy team, has patient been enrolled for Dupixent co pay card?

## 2023-04-02 NOTE — Telephone Encounter (Signed)
Please refer to 03/31/2023 phone note.

## 2023-04-03 ENCOUNTER — Ambulatory Visit: Payer: BC Managed Care – PPO | Admitting: Pharmacist

## 2023-04-03 ENCOUNTER — Encounter: Payer: Self-pay | Admitting: Pulmonary Disease

## 2023-04-03 ENCOUNTER — Other Ambulatory Visit: Payer: Self-pay

## 2023-04-03 DIAGNOSIS — J4550 Severe persistent asthma, uncomplicated: Secondary | ICD-10-CM

## 2023-04-03 DIAGNOSIS — Z7189 Other specified counseling: Secondary | ICD-10-CM

## 2023-04-03 MED ORDER — DUPIXENT 300 MG/2ML ~~LOC~~ SOAJ
300.0000 mg | SUBCUTANEOUS | 1 refills | Status: DC
Start: 2023-04-03 — End: 2023-10-09

## 2023-04-03 NOTE — Progress Notes (Signed)
HPI Patient presents today to  Pulmonary to see pharmacy team for Dupixent new start. Past medical history includes moderate persistent asthma with allergic rhinitis.  She receives allergy shots through Dr. Blasdell Callas at [facility] on a weekly basis (usually Fridays). Today's allergy shots have been cancelled.  She states that her asthma only occasionally has flareups but this past summer was noticeably triggering. She has three exacerbations and required multiple corticosteroid tapers  Has never self-administered injectable medications and is slightly nervous  Respiratory Medications Current regimen: Trelegy 200-62.5-25mcg (1 puff once daily), montelukast 10mg  nightly Tried in past: Symbicort, Qvar Patient reports no known adherence challenges  OBJECTIVE Allergies  Allergen Reactions   Latex Rash   Other Anaphylaxis    Tree nut   Peanut-Containing Drug Products Anaphylaxis   Amoxicillin-Pot Clavulanate     Other Reaction(s): severe diarrhea/stomach pain/nausea   Banana    Cherry    Coconut (Cocos Nucifera)    Watermelon [Citrullus Vulgaris]     Watermelon and Melons    Outpatient Encounter Medications as of 04/03/2023  Medication Sig   Dupilumab (DUPIXENT) 300 MG/2ML SOPN Inject 300 mg into the skin every 14 (fourteen) days. **loading dose completed in clinic on 04/03/23**   albuterol (PROVENTIL) (2.5 MG/3ML) 0.083% nebulizer solution Take 3 mLs (2.5 mg total) by nebulization every 6 (six) hours as needed.   amLODipine (NORVASC) 10 MG tablet Take 10 mg by mouth daily.   cetirizine (ZYRTEC) 10 MG tablet TAKE 1 TABLET BY MOUTH EVERY DAY (Patient taking differently: Take 10 mg by mouth daily.)   EPIPEN 2-PAK 0.3 MG/0.3ML DEVI Inject 0.3 mg into the muscle once as needed (anaphylaxis). As directed   estradiol (VIVELLE-DOT) 0.025 MG/24HR Place 1 patch onto the skin once a week.   fluticasone (FLONASE) 50 MCG/ACT nasal spray Place 1 spray into both nostrils daily  for 30 doses.   Fluticasone-Umeclidin-Vilant (TRELEGY ELLIPTA) 200-62.5-25 MCG/ACT AEPB 1 puff Inhalation Once a day   levalbuterol (XOPENEX HFA) 45 MCG/ACT inhaler 2 PUFFS EVERY 4 HOURS AS NEEDED 30 DAYS (Patient taking differently: Inhale 2 puffs into the lungs every 4 (four) hours as needed for wheezing or shortness of breath.)   metFORMIN (GLUCOPHAGE) 500 MG tablet Take 500 mg by mouth 2 (two) times daily with a meal.   montelukast (SINGULAIR) 10 MG tablet Take 10 mg by mouth at bedtime.   Specialty Vitamins Products (MENOPAUSE RELIEF PO) Take by mouth. Metagenics Estrovera Menopause Relief-1 tablet at night   No facility-administered encounter medications on file as of 04/03/2023.     Immunization History  Administered Date(s) Administered   Influenza Split 05/06/2011, 04/01/2012, 04/06/2016   Influenza Whole 04/06/2009, 03/19/2010   Influenza,inj,Quad PF,6+ Mos 06/15/2013, 06/13/2015   Influenza-Unspecified 05/18/2013, 04/01/2018, 03/25/2019   Moderna SARS-COV2 Booster Vaccination 05/28/2020   Moderna Sars-Covid-2 Vaccination 09/29/2019, 11/01/2019   Pneumococcal Polysaccharide-23 05/16/2009, 02/10/2012   Tdap 07/08/2007     PFTs    Latest Ref Rng & Units 03/12/2023    8:13 AM  PFT Results  FVC-Pre L 2.50   FVC-Predicted Pre % 71   FVC-Post L 2.32   FVC-Predicted Post % 65   Pre FEV1/FVC % % 84   Post FEV1/FCV % % 87   FEV1-Pre L 2.09   FEV1-Predicted Pre % 75   FEV1-Post L 2.01   DLCO uncorrected ml/min/mmHg 17.37   DLCO UNC% % 82   DLVA Predicted % 118   TLC L 4.01   TLC % Predicted %  79   RV % Predicted % 90     Eosinophils Most recent blood eosinophil count was 200 cells/microL taken on 02/17/23.   IgE: 1035 on 02/17/23  Assessment   Biologics training for dupilumab (Dupixent)  Goals of therapy: Mechanism: human monoclonal IgG4 antibody that inhibits interleukin-4 and interleukin-13 cytokine-induced responses, including release of proinflammatory cytokines,  chemokines, and IgE Reviewed that Dupixent is add-on medication and patient must continue maintenance inhaler regimen. Response to therapy: may take 4 months to determine efficacy. Discussed that patients generally feel improvement sooner than 4 months.  Side effects: injection site reaction (6-18%), antibody development (5-16%), ophthalmic conjunctivitis (2-16%), transient blood eosinophilia (1-2%)  Dose: 600mg  at Week 0 (administered today in clinic) followed by 300mg  every 14 days thereafter  Administration/Storage:  Reviewed administration sites of thigh or abdomen (at least 2-3 inches away from abdomen). Reviewed the upper arm is only appropriate if caregiver is administering injection  Do not shake pen/syringe as this could lead to product foaming or precipitation. Do not use if solution is discolored or contains particulate matter or if window on prefilled pen is yellow (indicates pen has been used).  Reviewed storage of medication in refrigerator. Reviewed that Dupixent can be stored at room temperature in unopened carton for up to 14 days.  Access: Approval of Dupixent through: insurance Patient enrolled into copay card program to help with copay assistance.  Patient self-administered Dupixent 300mg /54ml x 2 (total dose 600mg ) in right lower abdomen and provider adminsitered in left lower abdomen using sample: Dupixent 300mg /63mL autoinjector pen NDC: [phone] Lot: [id number] Expiration: 11/03/2024  Patient monitored for 30 minutes for adverse reaction.  Patient tolerated well.  Injection site noted. Patient denies itchiness and irritation at injection., No swelling or redness noted., and Reviewed injection site reaction management with patient verbally and printed information for review in AVS. Patient states she feels some tingling but no emergent symptoms and she attributes it to be more of a sensation - "she knows she received the injection"  Medication Reconciliation  A drug  regimen assessment was performed, including review of allergies, interactions, disease-state management, dosing and immunization history. Medications were reviewed with the patient, including name, instructions, indication, goals of therapy, potential side effects, importance of adherence, and safe use.  Drug interaction(s): none noted  PLAN Continue Dupixent 300mg  every 14 days.  Next dose is due 04/17/23 and every 14 days thereafter. Rx sent to:  [facility]. Their phone number is [phone]  .  Patient provided with pharmacy phone number and advised to call later this week to schedule shipment to home. Patient provided with copay card information to provide to pharmacy if quoted copay exceeds $5 per month. She will recruit help from her husband if needed to help with injections at home She feels relatively confident with home administration and confirms adequate support to home to remain compliant Continue maintenance asthma regimen of:  Trelegy 200-62.5-25mcg (1 puff once daily), montelukast 10mg  nightly Continue weekly allergy shots with Dr. [place] Callas but do recommend spacing to at least 3-4 days from Dupixent doses (to prevent overlapping injection reactions; relatively low concern for drug-drug interaction between allergy shots and Dupixent)  All questions encouraged and answered.  Instructed patient to reach out with any further questions or concerns.  Thank you for allowing pharmacy to participate in this patient's care.  This appointment required 60 minutes of patient care (this includes precharting, chart review, review of results, face-to-face care, etc.).   Chesley Mires, PharmD, MPH,  BCPS, CPP Clinical Pharmacist (Rheumatology and Pulmonology)

## 2023-04-03 NOTE — Patient Instructions (Addendum)
Your next DUPIXENT dose is due on 04/17/23, 05/01/23, and every 14 days thereafter  CONTINUE Trelegy 200-62.5-25mcg (1 puff once daily), montelukast 10mg  nightly CONTINUE weekly allergy shots with Dr. [place] Callas. He can decide to discontinue once you're stable on treatment (a few months down the road at least). Try to space your allergy shots from Dupixent by 3-4 days if possible!  Your prescription will be shipped from [facility]. Their phone number is [phone] Please call to schedule shipment and confirm address. They will mail your medication to your home.  Your copay should be affordable. If you call the pharmacy and it is not affordable, please double-check that they are billing through your copay card as secondary coverage. That copay card information is: ID: [id number] Group: [id number] PCN: [id number] BIN: [id number]  You will need to be seen by your provider in 3 to 4 months to assess how DUPIXENT is working for you. Please keep your follow-up appointment that is scheduled in December. Call our clinic if you need to make this appointment.  How to manage an injection site reaction: Remember the 5 C's: COUNTER - leave on the counter at least 30 minutes but up to overnight to bring medication to room temperature. This may help prevent stinging COLD - place something cold (like an ice gel pack or cold water bottle) on the injection site just before cleansing with alcohol. This may help reduce pain CLARITIN - use Claritin (generic name is loratadine) for the first two weeks of treatment or the day of, the day before, and the day after injecting. This will help to minimize injection site reactions CORTISONE CREAM - apply if injection site is irritated and itching CALL ME - if injection site reaction is bigger than the size of your fist, looks infected, blisters, or if you develop hives

## 2023-04-06 NOTE — Telephone Encounter (Signed)
Dr. Larinda Buttery, please advise on albuterol. Thanks

## 2023-04-07 ENCOUNTER — Encounter (INDEPENDENT_AMBULATORY_CARE_PROVIDER_SITE_OTHER): Payer: Self-pay | Admitting: Family Medicine

## 2023-04-07 ENCOUNTER — Ambulatory Visit (INDEPENDENT_AMBULATORY_CARE_PROVIDER_SITE_OTHER): Payer: BC Managed Care – PPO | Admitting: Family Medicine

## 2023-04-07 VITALS — BP 141/79 | HR 97 | Temp 98.7°F | Ht 64.0 in | Wt 250.0 lb

## 2023-04-07 DIAGNOSIS — I152 Hypertension secondary to endocrine disorders: Secondary | ICD-10-CM

## 2023-04-07 DIAGNOSIS — G4733 Obstructive sleep apnea (adult) (pediatric): Secondary | ICD-10-CM

## 2023-04-07 DIAGNOSIS — E119 Type 2 diabetes mellitus without complications: Secondary | ICD-10-CM

## 2023-04-07 DIAGNOSIS — Z6841 Body Mass Index (BMI) 40.0 and over, adult: Secondary | ICD-10-CM

## 2023-04-07 DIAGNOSIS — E785 Hyperlipidemia, unspecified: Secondary | ICD-10-CM

## 2023-04-07 DIAGNOSIS — E1159 Type 2 diabetes mellitus with other circulatory complications: Secondary | ICD-10-CM | POA: Diagnosis not present

## 2023-04-07 DIAGNOSIS — E1169 Type 2 diabetes mellitus with other specified complication: Secondary | ICD-10-CM

## 2023-04-07 DIAGNOSIS — Z7984 Long term (current) use of oral hypoglycemic drugs: Secondary | ICD-10-CM

## 2023-04-07 DIAGNOSIS — E66813 Obesity, class 3: Secondary | ICD-10-CM

## 2023-04-07 DIAGNOSIS — E559 Vitamin D deficiency, unspecified: Secondary | ICD-10-CM

## 2023-04-07 MED ORDER — VITAMIN D (ERGOCALCIFEROL) 1.25 MG (50000 UNIT) PO CAPS
50000.0000 [IU] | ORAL_CAPSULE | ORAL | 0 refills | Status: DC
Start: 2023-04-07 — End: 2023-04-22

## 2023-04-07 NOTE — Progress Notes (Deleted)
Sylvia [name], D.O.  ABFM, ABOM Clinical Bariatric Medicine Physician  Office located at: [address] [place], [place]  [address]     Assessment and Plan:   Meds ordered this encounter  Medications   Vitamin D, Ergocalciferol, (DRISDOL) 1.25 MG (50000 UNIT) CAPS capsule    Sig: Take 1 capsule (50,000 Units total) by mouth every 7 (seven) days.    Dispense:  4 capsule    Refill:  0      New onset type 2 diabetes mellitus (HCC) Assessment & Plan: Has never been told she has DM in the past.  Pt is shocked and tearful to learn of this news today. Lab Results  Component Value Date   HGBA1C 7.0 (H) 03/24/2023   HGBA1C 5.2 02/09/2012   INSULIN 12.0 03/24/2023   Lab Results  Component Value Date   CREATININE 0.64 03/24/2023   BUN 12 03/24/2023   NA 142 03/24/2023   K 4.0 03/24/2023   CL 102 03/24/2023   CO2 25 03/24/2023      Component Value Date/Time   PROT 6.6 03/24/2023 0910   ALBUMIN 3.9 03/24/2023 0910   AST 15 03/24/2023 0910   ALT 22 03/24/2023 0910   ALKPHOS 106 03/24/2023 0910   BILITOT 0.3 03/24/2023 0910   Lab Results  Component Value Date   TSH 1.730 03/24/2023   FREET4 1.11 03/24/2023   Lab Results  Component Value Date   VITAMINB12 648 03/24/2023   FOLATE 9.4 03/24/2023    Currently on Metformin 500 mg bid without any adverse SE which was apprently for Pre-DM per pt prior. Hemoglobin A1c levels are 7.0 and in the diabetic range. Fasting insulin levels are roughly 2.5 times normal. Kidney function, electrolytes, and liver enzymes within normal limits. No concerns with thyroid levels. No concerns with B12 and folate levels.   I encouraged the patient about the importance of drinking approximately half of their body weight ( in pounds) in ounces of water daily. Explained role of simple carbs and insulin levels on hunger and cravings. Continue to decrease simple carbs/ sugars; increase fiber and proteins -> follow her meal plan. Con t with  Metformin at current dose - we may consider initiating an additional diabetic medication that aids in weight loss in the future.  Pt reminded to also f/up with PCP regarding this new diagnosis in the very near future.  Educated about need for DM eye exams and foot exams etc.       Hyperlipidemia associated with type 2 diabetes mellitus (HCC) - new onset Assessment & Plan: Lab Results  Component Value Date   CHOL 198 03/24/2023   HDL 62 03/24/2023   LDLCALC 111 (H) 03/24/2023   TRIG 141 03/24/2023   The 10-year ASCVD risk score (Arnett DK, et al., 2019) is: 10.7%   Values used to calculate the score:     Age: 52 years     Sex: Female     Is Non-Hispanic African American: Yes     Diabetic: Yes     Tobacco smoker: No     Systolic Blood Pressure: 141 mmHg     Is BP treated: Yes     HDL Cholesterol: 62 mg/dL     Total Cholesterol: 198 mg/dL  LDL levels are elevated at 111. Goal LDL < 70 in Diabetics. Other components of lipid panel are within recommended limits.    Extensive counseling done re: Chol goals in a diabetic.  We recommend: aerobic activity  with eventual goal of a minimum of 150+ min wk plus 2 days/ week of resistance or strength training.  C/w our treatment plan of a prudent nutritional plan that is low in saturated and trans fats, and low in fatty carbs to improve these numbers. Pt advised to f/up with PCP in the very near future to discuss this NEW diagnosis and the initiation of statin therapy     Hypertension associated with diabetes (HCC) - not at goal Assessment & Plan: Last 3 blood pressure readings in our office are as follows: BP Readings from Last 3 Encounters:  04/07/23 (!) 141/79  03/26/23 132/86  03/24/23 119/75   HTN treated with Norvasc 10 mg daily only.  BP above target; pt asymptomatic; no concerns   Goal BP 130/80 or less. Pt instructed to check bp at home 2-3 times a week. Continue Norvasc per PCP and our low sodium diet, advance exercise as  tolerated. Pt advised to discuss with PCP at next OV re: adding an additional antihypertensive to her current regiment if BP still remains elevated and above goal     Vitamin D deficiency - new onset Assessment & Plan:  + fatigue/ tiredness, + achiness in joints.  No prior h/o vit D def.  No prior bone density Lab Results  Component Value Date   VD25OH 25.3 (L) 03/24/2023   I discussed the importance of vitamin D to the patient's health and well-being. I reviewed possible symptoms of low Vitamin D including:  low energy, depressed mood, muscle aches, joint aches, osteoporosis etc. with patient.  Educated on how low vitamin D levels can make it more difficult to lose weight.  Pt has been instructed to begin Rx vitamin D 50,000 units once a week.    OSA- will be started on CPAP soon Assessment & Plan: Pt had a sleep study recently and reports that she will be started on CPAP soon. Will continue to monitor.    Class 3 severe obesity due to excess calories with serious comorbidity and body mass index (BMI) of 50.0 to 59.9 in adult [facility]) Assessment & Plan: Since last office visit patient's  Muscle mass has increased by 0.8 lb. Fat mass has decreased by 5.4 lb. Total body water has decreased by 1.8 lb.  Counseling done on how various foods will affect these numbers and how to maximize success  Total lbs lost to date: 4 lbs  Total weight loss percentage to date: 1.57%   Meal Plan: Begin journaling 1,100 calories and 85+ grams protein daily with the Category 1 meal plan as a guide.    I discussed with pt the potential benefits of tracking the foods they eat and drink. Journaling can help one further understand their eating habits and patterns and it's a great tool to make more conscious meal choices throughout the day.    Recommended patient to sometimes have [employer] All Natural or Whole 30 on days she does not feel like cooking.   Behavioral Intervention Additional resources provided  today: Food journaling plan information Evidence-based interventions for health behavior change were utilized today including the discussion of self monitoring techniques, problem-solving barriers and SMART goal setting techniques.   Regarding patient's less desirable eating habits and patterns, we employed the technique of small changes.  Pt will specifically work on: begin journaling her intake/ bring in food log  & drink 80 ounces of water daily for next visit.   FOLLOW UP: Return 04/22/23. She was informed of the importance of  frequent follow up visits to maximize her success with intensive lifestyle modifications for her multiple health conditions.  Subjective:   Chief complaint: Obesity Sylvia [name] is here to discuss her progress with her obesity treatment plan. She is on the Category 1 Plan and states she is following her eating plan approximately 50 % of the time. She states she is not exercising.   Interval History:  Sylvia [name] is here today for her first follow-up office visit since starting the program with [place]. Sylvia [name] endorses that her family has been supportive of her weight loss journey. Pt reports starting the meal plan roughly 1 week ago. Has been mostly following the Category 1, but is also eating some foods from the [place] plan. She weighed her proteins and her veggies. On some nights, pt felt hungry and would subside this by drinking water. Pt has several questions about her meal plan, which will all be addressed today.   All blood work/ lab tests that were recently ordered by myself or an outside provider were reviewed with patient today per their request. Extended time was spent counseling her on all new disease processes that were discovered or preexisting ones that are affected by BMI.  she understands that many of these abnormalities will need to monitored regularly along with the current treatment plan of prudent dietary changes, in which we are making each and every  office visit, to improve these health parameters.  Pharmacotherapy for weight loss: She is currently taking  Metformin 500 mg bid  for medical weight loss.  Denies side effects.    Review of Systems:  Pertinent positives were addressed with patient today.  Reviewed by clinician on day of visit: allergies, medications, problem list, medical history, surgical history, family history, social history, and previous encounter notes.  Weight Summary and Biometrics   Weight Lost Since Last Visit: 4lb  Weight Gained Since Last Visit: 0lb   Vitals Temp: 98.7 F (37.1 C) BP: (!) 141/79 Pulse Rate: 97 SpO2: 98 %   Anthropometric Measurements Height: 5\' 4"  (1.626 m) Weight: 250 lb (113.4 kg) BMI (Calculated): 42.89 Weight at Last Visit: 254lb Weight Lost Since Last Visit: 4lb Weight Gained Since Last Visit: 0lb Starting Weight: 254lb Total Weight Loss (lbs): 4 lb (1.814 kg) Peak Weight: 279lb   Body Composition  Body Fat %: 49.2 % Fat Mass (lbs): 123 lbs Muscle Mass (lbs): 120.6 lbs Total Body Water (lbs): 88.8 lbs Visceral Fat Rating : 16   Other Clinical Data Fasting: no Labs: no Today's Visit #: 2   Objective:   PHYSICAL EXAM:  Blood pressure (!) 141/79, pulse 97, temperature 98.7 F (37.1 C), height 5\' 4"  (1.626 m), weight 250 lb (113.4 kg), last menstrual period 05/24/2018, SpO2 98%. Body mass index is 42.91 kg/m.  General: Well Developed, well nourished, and in no acute distress.  HEENT: Normocephalic, atraumatic Skin: Warm and dry, cap RF less 2 sec, good turgor Chest:  Normal excursion, shape, no gross abn Respiratory: speaking in full sentences, no conversational dyspnea NeuroM-Sk: Ambulates w/o assistance, moves * 4 Psych: A and O *3, insight good, mood-full  DIAGNOSTIC DATA REVIEWED:  BMET    Component Value Date/Time   NA 142 03/24/2023 0910   K 4.0 03/24/2023 0910   CL 102 03/24/2023 0910   CO2 25 03/24/2023 0910   GLUCOSE 104 (H) 03/24/2023  0910   GLUCOSE 102 (H) 06/11/2018 1140   BUN 12 03/24/2023 0910   CREATININE 0.64 03/24/2023 0910   CALCIUM  9.4 03/24/2023 0910   GFRNONAA >60 06/11/2018 1140   GFRAA >60 06/11/2018 1140   Lab Results  Component Value Date   HGBA1C 7.0 (H) 03/24/2023   HGBA1C 5.2 02/09/2012   Lab Results  Component Value Date   INSULIN 12.0 03/24/2023   Lab Results  Component Value Date   TSH 1.730 03/24/2023   CBC    Component Value Date/Time   WBC 10.4 02/17/2023 0937   RBC 4.60 02/17/2023 0937   HGB 13.8 02/17/2023 0937   HCT 43.2 02/17/2023 0937   PLT 354 02/17/2023 0937   MCV 93.9 02/17/2023 0937   MCH 30.0 02/17/2023 0937   MCHC 31.9 02/17/2023 0937   RDW 13.5 02/17/2023 0937   Iron Studies No results found for: "IRON", "TIBC", "FERRITIN", "IRONPCTSAT" Lipid Panel     Component Value Date/Time   CHOL 198 03/24/2023 0910   TRIG 141 03/24/2023 0910   HDL 62 03/24/2023 0910   LDLCALC 111 (H) 03/24/2023 0910   Hepatic Function Panel     Component Value Date/Time   PROT 6.6 03/24/2023 0910   ALBUMIN 3.9 03/24/2023 0910   AST 15 03/24/2023 0910   ALT 22 03/24/2023 0910   ALKPHOS 106 03/24/2023 0910   BILITOT 0.3 03/24/2023 0910      Component Value Date/Time   TSH 1.730 03/24/2023 0910   Nutritional Lab Results  Component Value Date   VD25OH 25.3 (L) 03/24/2023    Attestations:   Reviewed by clinician on day of visit: allergies, medications, problem list, medical history, surgical history, family history, social history, and previous encounter notes.   Patient was in the office today and time spent on visit including pre-visit chart review and post-visit care/coordination of care and electronic medical record documentation was 55 minutes. 50% of the time was in face to face counseling of this patient's medical condition(s) and providing education on treatment options to include the first-line treatment of diet and lifestyle modification.  I, Special Randolm Idol, acting  as a Stage manager for [employer], DO., have compiled all relevant documentation for today's office visit on behalf of Thomasene Lot, DO, while in the presence of [employer], DO.  I have reviewed the above documentation for accuracy and completeness, and I agree with the above. Sylvia [name], D.O.  The 21st Century Cures Act was signed into law in 2016 which includes the topic of electronic health records.  This provides immediate access to information in MyChart.  This includes consultation notes, operative notes, office notes, lab results and pathology reports.  If you have any questions about what you read please let us know at your next visit so we can discuss your concerns and take corrective action if need be.  We are right here with you.

## 2023-04-08 DIAGNOSIS — F4320 Adjustment disorder, unspecified: Secondary | ICD-10-CM | POA: Diagnosis not present

## 2023-04-08 NOTE — Progress Notes (Signed)
Sylvia [name], D.O.  ABFM, ABOM Clinical Bariatric Medicine Physician  Office located at: [address] [place], [place]  [address]     Assessment and Plan:   Meds ordered this encounter  Medications   Vitamin D, Ergocalciferol, (DRISDOL) 1.25 MG (50000 UNIT) CAPS capsule    Sig: Take 1 capsule (50,000 Units total) by mouth every 7 (seven) days.    Dispense:  4 capsule    Refill:  0    New onset type 2 diabetes mellitus (HCC) Assessment & Plan: Has never been told she has DM in the past.  Pt is shocked and tearful to learn of this news today. Lab Results  Component Value Date   HGBA1C 7.0 (H) 03/24/2023   HGBA1C 5.2 02/09/2012   INSULIN 12.0 03/24/2023   Lab Results  Component Value Date   CREATININE 0.64 03/24/2023   BUN 12 03/24/2023   NA 142 03/24/2023   K 4.0 03/24/2023   CL 102 03/24/2023   CO2 25 03/24/2023      Component Value Date/Time   PROT 6.6 03/24/2023 0910   ALBUMIN 3.9 03/24/2023 0910   AST 15 03/24/2023 0910   ALT 22 03/24/2023 0910   ALKPHOS 106 03/24/2023 0910   BILITOT 0.3 03/24/2023 0910   Lab Results  Component Value Date   TSH 1.730 03/24/2023   FREET4 1.11 03/24/2023   Lab Results  Component Value Date   VITAMINB12 648 03/24/2023   FOLATE 9.4 03/24/2023    Currently on Metformin 500 mg bid without any adverse SE which was apprently for Pre-DM per pt prior. Hemoglobin A1c levels are 7.0 and in the diabetic range. Fasting insulin levels are roughly 2.5 times normal. Kidney function, electrolytes, and liver enzymes within normal limits. No concerns with thyroid levels. No concerns with B12 and folate levels.   I recommended the patient to drink approximately half of their body weight in ounces of water daily. Additionally, have an extra bottle of water for every 30 minutes of exercise. Explained role of simple carbs and insulin levels on hunger and cravings. Continue to decrease simple carbs/ sugars; increase fiber and proteins  -> follow her meal plan. C/w with Metformin at current dose - we may consider initiating an additional diabetic medication that aids in weight loss in the future. Pt reminded to also f/up with PCP regarding this new diagnosis in the very near future.  Educated about need for DM eye exams and foot exams etc.     Hyperlipidemia associated with type 2 diabetes mellitus (HCC) - new onset Assessment & Plan: Lab Results  Component Value Date   CHOL 198 03/24/2023   HDL 62 03/24/2023   LDLCALC 111 (H) 03/24/2023   TRIG 141 03/24/2023   The 10-year ASCVD risk score (Arnett DK, et al., 2019) is: 10.7%   Values used to calculate the score:     Age: 52 years     Sex: Female     Is Non-Hispanic African American: Yes     Diabetic: Yes     Tobacco smoker: No     Systolic Blood Pressure: 141 mmHg     Is BP treated: Yes     HDL Cholesterol: 62 mg/dL     Total Cholesterol: 198 mg/dL  LDL levels are elevated at 111. Goal LDL < 70 in diabetics. Other components of lipid panel are within recommended limits.    Extensive counseling done re: chol goals in a diabetic.  We recommend: aerobic  activity with eventual goal of a minimum of 150+ min wk plus 2 days/ week of resistance or strength training.  C/w our treatment plan of a prudent nutritional plan that is low in saturated and trans fats, and low in fatty carbs to improve these numbers. Pt advised to f/up with PCP in the very near future to discuss this NEW diagnosis and the initiation of statin therapy.   Hypertension associated with diabetes (HCC) - not at goal Assessment & Plan: Last 3 blood pressure readings in our office are as follows: BP Readings from Last 3 Encounters:  04/07/23 (!) 141/79  03/26/23 132/86  03/24/23 119/75   HTN treated with Norvasc 10 mg daily only.  BP above target; pt asymptomatic; no concerns. Goal BP 130/80 or less. Pt instructed to check bp at home 2-3 times a week. Continue Norvasc per PCP and our low sodium diet,  advance exercise as tolerated. Pt advised to discuss with PCP at next OV re: adding an additional antihypertensive to her current regiment if BP still remains elevated and above goal   Vitamin D deficiency - new onset Assessment & Plan:  + fatigue/ tiredness, + achiness in joints.  No prior h/o vit D def.  No prior bone density Lab Results  Component Value Date   VD25OH 25.3 (L) 03/24/2023   I discussed the importance of vitamin D to the patient's health and well-being. I reviewed possible symptoms of low Vitamin D including:  low energy, depressed mood, muscle aches, joint aches, osteoporosis etc. with patient.  Educated on how low vitamin D levels can make it more difficult to lose weight.  Pt has been instructed to begin Rx vitamin D 50,000 units once a week.    OSA- will be started on CPAP soon Assessment & Plan: Pt had a sleep study recently and reports that she will be started on CPAP soon. Will continue to monitor.    Class 3 severe obesity due to excess calories with serious comorbidity and body mass index (BMI) of 50.0 to 59.9 in adult [facility]) Assessment & Plan: Since last office visit patient's  Muscle mass has increased by 0.8 lb. Fat mass has decreased by 5.4 lb. Total body water has decreased by 1.8 lb.  Counseling done on how various foods will affect these numbers and how to maximize success  Total lbs lost to date: 4 lbs  Total weight loss percentage to date: 1.57%   Meal Plan: Begin journaling 1,100 calories and 85+ grams protein daily with the Category 1 meal plan as a guide.    I discussed with pt the potential benefits of tracking the foods they eat and drink. Journaling can help one further understand their eating habits and patterns and it's a great tool to make more conscious meal choices throughout the day.    Recommended patient to sometimes have [employer] or Whole 30 on days she does not feel like cooking.   Behavioral Intervention Additional  resources provided today: Food journaling plan information Evidence-based interventions for health behavior change were utilized today including the discussion of self monitoring techniques, problem-solving barriers and SMART goal setting techniques.   Regarding patient's less desirable eating habits and patterns, we employed the technique of small changes.  Pt will specifically work on: begin journaling her intake/ bring in food log  & drink 80 ounces of water daily for next visit.   FOLLOW UP: Return 04/22/23. She was informed of the importance of frequent follow up visits to  maximize her success with intensive lifestyle modifications for her multiple health conditions.  Subjective:   Chief complaint: Obesity Sylvia [name] is here to discuss her progress with her obesity treatment plan. She is on the Category 1 Plan and states she is following her eating plan approximately 50 % of the time. She states she is not exercising.   Interval History:  Sylvia [name] is here today for her first follow-up office visit since starting the program with [place]. [place]a endorses that her family has been supportive of her weight loss journey. Pt reports starting the meal plan roughly 1 week ago. Has been mostly following the Category 1, but is also eating some foods from the Pescatar[place] plan. She weighed her proteins and her veggies. On some nights, pt felt hungry and would subside this by drinking water. Pt has several questions about her meal plan, which will all be addressed today.   All blood work/ lab tests that were recently ordered by myself or an outside provider were reviewed with patient today per their request. Extended time was spent counseling her on all new disease processes that were discovered or preexisting ones that are affected by BMI.  she understands that many of these abnormalities will need to monitored regularly along with the current treatment plan of prudent dietary changes, in which we are making  each and every office visit, to improve these health parameters.  Pharmacotherapy for weight loss: She is currently taking  Metformin 500 mg bid  for medical weight loss.  Denies side effects.    Review of Systems:  Pertinent positives were addressed with patient today.  Reviewed by clinician on day of visit: allergies, medications, problem list, medical history, surgical history, family history, social history, and previous encounter notes.  Weight Summary and Biometrics   Weight Lost Since Last Visit: 4lb  Weight Gained Since Last Visit: 0lb   Vitals Temp: 98.7 F (37.1 C) BP: (!) 141/79 Pulse Rate: 97 SpO2: 98 %   Anthropometric Measurements Height: 5\' 4"  (1.626 m) Weight: 250 lb (113.4 kg) BMI (Calculated): 42.89 Weight at Last Visit: 254lb Weight Lost Since Last Visit: 4lb Weight Gained Since Last Visit: 0lb Starting Weight: 254lb Total Weight Loss (lbs): 4 lb (1.814 kg) Peak Weight: 279lb   Body Composition  Body Fat %: 49.2 % Fat Mass (lbs): 123 lbs Muscle Mass (lbs): 120.6 lbs Total Body Water (lbs): 88.8 lbs Visceral Fat Rating : 16   Other Clinical Data Fasting: no Labs: no Today's Visit #: 2   Objective:   PHYSICAL EXAM:  Blood pressure (!) 141/79, pulse 97, temperature 98.7 F (37.1 C), height 5\' 4"  (1.626 m), weight 250 lb (113.4 kg), last menstrual period 05/24/2018, SpO2 98%. Body mass index is 42.91 kg/m.  General: Well Developed, well nourished, and in no acute distress.  HEENT: Normocephalic, atraumatic Skin: Warm and dry, cap RF less 2 sec, good turgor Chest:  Normal excursion, shape, no gross abn Respiratory: speaking in full sentences, no conversational dyspnea NeuroM-Sk: Ambulates w/o assistance, moves * 4 Psych: A and O *3, insight good, mood-full  DIAGNOSTIC DATA REVIEWED:  BMET    Component Value Date/Time   NA 142 03/24/2023 0910   K 4.0 03/24/2023 0910   CL 102 03/24/2023 0910   CO2 25 03/24/2023 0910   GLUCOSE 104  (H) 03/24/2023 0910   GLUCOSE 102 (H) 06/11/2018 1140   BUN 12 03/24/2023 0910   CREATININE 0.64 03/24/2023 0910   CALCIUM 9.4 03/24/2023 0910  GFRNONAA >60 06/11/2018 1140   GFRAA >60 06/11/2018 1140   Lab Results  Component Value Date   HGBA1C 7.0 (H) 03/24/2023   HGBA1C 5.2 02/09/2012   Lab Results  Component Value Date   INSULIN 12.0 03/24/2023   Lab Results  Component Value Date   TSH 1.730 03/24/2023   CBC    Component Value Date/Time   WBC 10.4 02/17/2023 0937   RBC 4.60 02/17/2023 0937   HGB 13.8 02/17/2023 0937   HCT 43.2 02/17/2023 0937   PLT 354 02/17/2023 0937   MCV 93.9 02/17/2023 0937   MCH 30.0 02/17/2023 0937   MCHC 31.9 02/17/2023 0937   RDW 13.5 02/17/2023 0937   Iron Studies No results found for: "IRON", "TIBC", "FERRITIN", "IRONPCTSAT" Lipid Panel     Component Value Date/Time   CHOL 198 03/24/2023 0910   TRIG 141 03/24/2023 0910   HDL 62 03/24/2023 0910   LDLCALC 111 (H) 03/24/2023 0910   Hepatic Function Panel     Component Value Date/Time   PROT 6.6 03/24/2023 0910   ALBUMIN 3.9 03/24/2023 0910   AST 15 03/24/2023 0910   ALT 22 03/24/2023 0910   ALKPHOS 106 03/24/2023 0910   BILITOT 0.3 03/24/2023 0910      Component Value Date/Time   TSH 1.730 03/24/2023 0910   Nutritional Lab Results  Component Value Date   VD25OH 25.3 (L) 03/24/2023    Attestations:   Reviewed by clinician on day of visit: allergies, medications, problem list, medical history, surgical history, family history, social history, and previous encounter notes.   Patient was in the office today and time spent on visit including pre-visit chart review and post-visit care/coordination of care and electronic medical record documentation was 55 minutes. 50% of the time was in face to face counseling of this patient's medical condition(s) and providing education on treatment options to include the first-line treatment of diet and lifestyle modification.  I, Special  Randolm Idol, acting as a Stage manager for [employer], DO., have compiled all relevant documentation for today's office visit on behalf of Thomasene Lot, DO, while in the presence of [employer], DO.  I have reviewed the above documentation for accuracy and completeness, and I agree with the above. Sylvia [name], D.O.  The 21st Century Cures Act was signed into law in 2016 which includes the topic of electronic health records.  This provides immediate access to information in MyChart.  This includes consultation notes, operative notes, office notes, lab results and pathology reports.  If you have any questions about what you read please let us know at your next visit so we can discuss your concerns and take corrective action if need be.  We are right here with you.

## 2023-04-09 NOTE — Telephone Encounter (Signed)
Dr. Larinda Buttery, please advise. Thanks

## 2023-04-10 DIAGNOSIS — J3089 Other allergic rhinitis: Secondary | ICD-10-CM | POA: Diagnosis not present

## 2023-04-10 DIAGNOSIS — J301 Allergic rhinitis due to pollen: Secondary | ICD-10-CM | POA: Diagnosis not present

## 2023-04-10 DIAGNOSIS — J3081 Allergic rhinitis due to animal (cat) (dog) hair and dander: Secondary | ICD-10-CM | POA: Diagnosis not present

## 2023-04-10 MED ORDER — AIRSUPRA 90-80 MCG/ACT IN AERO: 2.0000 | INHALATION_SPRAY | Freq: Four times a day (QID) | RESPIRATORY_TRACT | 3 refills | Status: DC

## 2023-04-10 NOTE — Telephone Encounter (Signed)
Noted  

## 2023-04-13 DIAGNOSIS — F4320 Adjustment disorder, unspecified: Secondary | ICD-10-CM | POA: Diagnosis not present

## 2023-04-17 DIAGNOSIS — J301 Allergic rhinitis due to pollen: Secondary | ICD-10-CM | POA: Diagnosis not present

## 2023-04-17 DIAGNOSIS — J3081 Allergic rhinitis due to animal (cat) (dog) hair and dander: Secondary | ICD-10-CM | POA: Diagnosis not present

## 2023-04-17 DIAGNOSIS — J3089 Other allergic rhinitis: Secondary | ICD-10-CM | POA: Diagnosis not present

## 2023-04-19 ENCOUNTER — Other Ambulatory Visit (INDEPENDENT_AMBULATORY_CARE_PROVIDER_SITE_OTHER): Payer: Self-pay | Admitting: Family Medicine

## 2023-04-19 DIAGNOSIS — E559 Vitamin D deficiency, unspecified: Secondary | ICD-10-CM

## 2023-04-21 NOTE — Addendum Note (Signed)
Addended by: Carlye Grippe on: 04/21/2023 08:45 AM   Modules accepted: Level of Service

## 2023-04-22 ENCOUNTER — Ambulatory Visit (INDEPENDENT_AMBULATORY_CARE_PROVIDER_SITE_OTHER): Payer: BC Managed Care – PPO | Admitting: Physician Assistant

## 2023-04-22 ENCOUNTER — Encounter (INDEPENDENT_AMBULATORY_CARE_PROVIDER_SITE_OTHER): Payer: Self-pay | Admitting: Physician Assistant

## 2023-04-22 VITALS — BP 138/81 | HR 82 | Temp 98.4°F | Ht 64.0 in | Wt 251.0 lb

## 2023-04-22 DIAGNOSIS — I10 Essential (primary) hypertension: Secondary | ICD-10-CM | POA: Diagnosis not present

## 2023-04-22 DIAGNOSIS — F5089 Other specified eating disorder: Secondary | ICD-10-CM

## 2023-04-22 DIAGNOSIS — E1169 Type 2 diabetes mellitus with other specified complication: Secondary | ICD-10-CM

## 2023-04-22 DIAGNOSIS — F39 Unspecified mood [affective] disorder: Secondary | ICD-10-CM

## 2023-04-22 DIAGNOSIS — Z6841 Body Mass Index (BMI) 40.0 and over, adult: Secondary | ICD-10-CM

## 2023-04-22 DIAGNOSIS — E559 Vitamin D deficiency, unspecified: Secondary | ICD-10-CM | POA: Diagnosis not present

## 2023-04-22 DIAGNOSIS — E669 Obesity, unspecified: Secondary | ICD-10-CM

## 2023-04-22 DIAGNOSIS — Z7984 Long term (current) use of oral hypoglycemic drugs: Secondary | ICD-10-CM

## 2023-04-22 DIAGNOSIS — E119 Type 2 diabetes mellitus without complications: Secondary | ICD-10-CM

## 2023-04-22 DIAGNOSIS — F4320 Adjustment disorder, unspecified: Secondary | ICD-10-CM | POA: Diagnosis not present

## 2023-04-22 DIAGNOSIS — G4733 Obstructive sleep apnea (adult) (pediatric): Secondary | ICD-10-CM

## 2023-04-22 DIAGNOSIS — I152 Hypertension secondary to endocrine disorders: Secondary | ICD-10-CM

## 2023-04-22 MED ORDER — VITAMIN D (ERGOCALCIFEROL) 1.25 MG (50000 UNIT) PO CAPS
50000.0000 [IU] | ORAL_CAPSULE | ORAL | 0 refills | Status: DC
Start: 2023-04-22 — End: 2023-05-28

## 2023-04-22 NOTE — Progress Notes (Unsigned)
.smr  Office: [phone]  /  Fax: [phone]  WEIGHT SUMMARY AND BIOMETRICS  Vitals Temp: 98.4 F (36.9 C) BP: 138/81 Pulse Rate: 82 SpO2: 99 %   Anthropometric Measurements Height: 5\' 4"  (1.626 m) Weight: 251 lb (113.9 kg) BMI (Calculated): 43.06 Weight at Last Visit: 250 lb Weight Lost Since Last Visit: 0 Weight Gained Since Last Visit: 1 lb Starting Weight: 254 lb Total Weight Loss (lbs): 3 lb (1.361 kg) Peak Weight: 279 lb   Body Composition  Body Fat %: 50.5 % Fat Mass (lbs): 126.8 lbs Muscle Mass (lbs): 118 lbs Total Body Water (lbs): 88.2 lbs Visceral Fat Rating : 16   Other Clinical Data RMR: 1512 Fasting: no Labs: no Today's Visit #: 3 Starting Date: 03/24/23     HPI  Chief Complaint: OBESITY  Sylvia [name] is here to discuss her progress with her obesity treatment plan. She is on the {MWMwtlossportion/plan2:23431} and states she is following her eating plan approximately *** % of the time. She states she is exercising *** minutes *** times per week.   Interval History:  Since last office visit she ***  The patient, a 52 year old female with a history of obesity, type 2 diabetes, hypertension, vitamin D deficiency, and obstructive sleep apnea, presents for a follow-up of her obesity treatment plan. She reports difficulty adhering to her calorie-restricted diet, particularly during dinner. She has been a vegan and vegetarian in the past and is currently a pescatarian. She expresses frustration with the lack of diversity in her meals, particularly with the monotony of eating fish every day. She also reports difficulty drinking enough water, especially when she feels cold. The patient is also dealing with the emotional impact of her recent diabetes diagnosis, which has caused her significant distress. She has sought therapy to help manage her feelings and is currently attending weekly sessions.  Pharmacotherapy: ***  TREATMENT PLAN FOR  OBESITY:  Recommended Dietary Goals  Essynce is currently in the action stage of change. As such, her goal is to continue weight management plan. She has agreed to {MWMwtlossportion/plan2:23431}.  Behavioral Intervention  We discussed the following Behavioral Modification Strategies today: {EMWMwtlossstrategies:28914::"continue to work on maintaining a reduced calorie state, getting the recommended amount of protein, incorporating whole foods, making healthy choices, staying well hydrated and practicing mindfulness when eating."}.  Additional resources provided today: NA  Recommended Physical Activity Goals  Annye has been advised to work up to 150 minutes of moderate intensity aerobic activity a week and strengthening exercises 2-3 times per week for cardiovascular health, weight loss maintenance and preservation of muscle mass.   She has agreed to {EMEXERCISE:28847::"Think about enjoyable ways to increase daily physical activity and overcoming barriers to exercise","Increase physical activity in their day and reduce sedentary time (increase NEAT)."}   Pharmacotherapy We discussed various medication options to help Sylvia [name] with her weight loss efforts and we both agreed to ***.    Return in about 3 weeks (around 05/13/2023).Marland Kitchen She was informed of the importance of frequent follow up visits to maximize her success with intensive lifestyle modifications for her multiple health conditions.  PHYSICAL EXAM:  Blood pressure 138/81, pulse 82, temperature 98.4 F (36.9 C), height 5\' 4"  (1.626 m), weight 251 lb (113.9 kg), last menstrual period 05/24/2018, SpO2 99%. Body mass index is 43.08 kg/m.  General: She is overweight, cooperative, alert, well developed, and in no acute distress. PSYCH: Has normal mood, affect and thought process.   Lungs: Normal breathing effort, no conversational dyspnea.  DIAGNOSTIC DATA  REVIEWED:  BMET    Component Value Date/Time   NA 142 03/24/2023 0910   K  4.0 03/24/2023 0910   CL 102 03/24/2023 0910   CO2 25 03/24/2023 0910   GLUCOSE 104 (H) 03/24/2023 0910   GLUCOSE 102 (H) 06/11/2018 1140   BUN 12 03/24/2023 0910   CREATININE 0.64 03/24/2023 0910   CALCIUM 9.4 03/24/2023 0910   GFRNONAA >60 06/11/2018 1140   GFRAA >60 06/11/2018 1140   Lab Results  Component Value Date   HGBA1C 7.0 (H) 03/24/2023   HGBA1C 5.2 02/09/2012   Lab Results  Component Value Date   INSULIN 12.0 03/24/2023   Lab Results  Component Value Date   TSH 1.730 03/24/2023   CBC    Component Value Date/Time   WBC 10.4 02/17/2023 0937   RBC 4.60 02/17/2023 0937   HGB 13.8 02/17/2023 0937   HCT 43.2 02/17/2023 0937   PLT 354 02/17/2023 0937   MCV 93.9 02/17/2023 0937   MCH 30.0 02/17/2023 0937   MCHC 31.9 02/17/2023 0937   RDW 13.5 02/17/2023 0937   Iron Studies No results found for: "IRON", "TIBC", "FERRITIN", "IRONPCTSAT" Lipid Panel     Component Value Date/Time   CHOL 198 03/24/2023 0910   TRIG 141 03/24/2023 0910   HDL 62 03/24/2023 0910   LDLCALC 111 (H) 03/24/2023 0910   Hepatic Function Panel     Component Value Date/Time   PROT 6.6 03/24/2023 0910   ALBUMIN 3.9 03/24/2023 0910   AST 15 03/24/2023 0910   ALT 22 03/24/2023 0910   ALKPHOS 106 03/24/2023 0910   BILITOT 0.3 03/24/2023 0910      Component Value Date/Time   TSH 1.730 03/24/2023 0910   Nutritional Lab Results  Component Value Date   VD25OH 25.3 (L) 03/24/2023    ASSOCIATED CONDITIONS ADDRESSED TODAY  ASSESSMENT AND PLAN  Problem List Items Addressed This Visit   None Visit Diagnoses     New onset type 2 diabetes mellitus (HCC)    -  Primary   Hypertension associated with diabetes (HCC)       Vitamin D deficiency - new onset       OSA- will be started on CPAP soon       Mood disorder (HCC) - emotional eating       Obesity (HCC)- Start BMI 43.6 Date 03/24/2023           ATTESTASTION STATEMENTS:  Reviewed by clinician on day of visit: allergies,  medications, problem list, medical history, surgical history, family history, social history, and previous encounter notes.   I have personally spent 30 minutes total time today in preparation, patient care, nutritional counseling and documentation for this visit, including the following: review of clinical lab tests; review of medical tests/procedures/services.      Jenny Omdahl, PA-C

## 2023-04-23 DIAGNOSIS — E1159 Type 2 diabetes mellitus with other circulatory complications: Secondary | ICD-10-CM | POA: Insufficient documentation

## 2023-04-23 DIAGNOSIS — E119 Type 2 diabetes mellitus without complications: Secondary | ICD-10-CM | POA: Insufficient documentation

## 2023-04-23 DIAGNOSIS — Z6841 Body Mass Index (BMI) 40.0 and over, adult: Secondary | ICD-10-CM | POA: Insufficient documentation

## 2023-04-23 DIAGNOSIS — E559 Vitamin D deficiency, unspecified: Secondary | ICD-10-CM | POA: Insufficient documentation

## 2023-04-24 DIAGNOSIS — J3089 Other allergic rhinitis: Secondary | ICD-10-CM | POA: Diagnosis not present

## 2023-04-24 DIAGNOSIS — J3081 Allergic rhinitis due to animal (cat) (dog) hair and dander: Secondary | ICD-10-CM | POA: Diagnosis not present

## 2023-04-24 DIAGNOSIS — J301 Allergic rhinitis due to pollen: Secondary | ICD-10-CM | POA: Diagnosis not present

## 2023-04-27 DIAGNOSIS — G4733 Obstructive sleep apnea (adult) (pediatric): Secondary | ICD-10-CM | POA: Diagnosis not present

## 2023-04-29 DIAGNOSIS — F4320 Adjustment disorder, unspecified: Secondary | ICD-10-CM | POA: Diagnosis not present

## 2023-05-01 DIAGNOSIS — J3081 Allergic rhinitis due to animal (cat) (dog) hair and dander: Secondary | ICD-10-CM | POA: Diagnosis not present

## 2023-05-01 DIAGNOSIS — J3089 Other allergic rhinitis: Secondary | ICD-10-CM | POA: Diagnosis not present

## 2023-05-01 DIAGNOSIS — J301 Allergic rhinitis due to pollen: Secondary | ICD-10-CM | POA: Diagnosis not present

## 2023-05-04 DIAGNOSIS — F4320 Adjustment disorder, unspecified: Secondary | ICD-10-CM | POA: Diagnosis not present

## 2023-05-05 DIAGNOSIS — I10 Essential (primary) hypertension: Secondary | ICD-10-CM | POA: Diagnosis not present

## 2023-05-05 DIAGNOSIS — Z23 Encounter for immunization: Secondary | ICD-10-CM | POA: Diagnosis not present

## 2023-05-05 DIAGNOSIS — R7303 Prediabetes: Secondary | ICD-10-CM | POA: Diagnosis not present

## 2023-05-05 DIAGNOSIS — G4733 Obstructive sleep apnea (adult) (pediatric): Secondary | ICD-10-CM | POA: Diagnosis not present

## 2023-05-07 DIAGNOSIS — J301 Allergic rhinitis due to pollen: Secondary | ICD-10-CM | POA: Diagnosis not present

## 2023-05-08 DIAGNOSIS — J3081 Allergic rhinitis due to animal (cat) (dog) hair and dander: Secondary | ICD-10-CM | POA: Diagnosis not present

## 2023-05-08 DIAGNOSIS — J3089 Other allergic rhinitis: Secondary | ICD-10-CM | POA: Diagnosis not present

## 2023-05-08 DIAGNOSIS — J301 Allergic rhinitis due to pollen: Secondary | ICD-10-CM | POA: Diagnosis not present

## 2023-05-13 ENCOUNTER — Ambulatory Visit (INDEPENDENT_AMBULATORY_CARE_PROVIDER_SITE_OTHER): Payer: BC Managed Care – PPO | Admitting: Family Medicine

## 2023-05-13 ENCOUNTER — Encounter (INDEPENDENT_AMBULATORY_CARE_PROVIDER_SITE_OTHER): Payer: Self-pay | Admitting: Family Medicine

## 2023-05-13 VITALS — BP 132/77 | HR 86 | Temp 99.3°F | Ht 64.0 in | Wt 247.0 lb

## 2023-05-13 DIAGNOSIS — E119 Type 2 diabetes mellitus without complications: Secondary | ICD-10-CM | POA: Diagnosis not present

## 2023-05-13 DIAGNOSIS — I152 Hypertension secondary to endocrine disorders: Secondary | ICD-10-CM | POA: Diagnosis not present

## 2023-05-13 DIAGNOSIS — E559 Vitamin D deficiency, unspecified: Secondary | ICD-10-CM

## 2023-05-13 DIAGNOSIS — E669 Obesity, unspecified: Secondary | ICD-10-CM

## 2023-05-13 DIAGNOSIS — E1159 Type 2 diabetes mellitus with other circulatory complications: Secondary | ICD-10-CM

## 2023-05-13 DIAGNOSIS — Z6841 Body Mass Index (BMI) 40.0 and over, adult: Secondary | ICD-10-CM

## 2023-05-13 DIAGNOSIS — F4320 Adjustment disorder, unspecified: Secondary | ICD-10-CM | POA: Diagnosis not present

## 2023-05-13 DIAGNOSIS — Z7984 Long term (current) use of oral hypoglycemic drugs: Secondary | ICD-10-CM

## 2023-05-13 NOTE — Progress Notes (Signed)
Sylvia [name], D.O.  ABFM, ABOM Specializing in Clinical Bariatric Medicine  Office located at: [address] [place], [place]  [address]     Assessment and Plan:   Vitamin D deficiency - new onset Assessment: Condition is Not optimized.. Since beginning Ergocalciferol once weekly pt has tolerated this well. She denies any adverse side effects, especially regarding her bone health.  Lab Results  Component Value Date   VD25OH 25.3 (L) 03/24/2023   Plan: - Continue with Ergocalciferol 50K IU weekly. She denies need for refill.   - weight loss will likely improve availability of vitamin D, thus encouraged Sylvia [name] to continue with meal plan and their weight loss efforts to further improve this condition.  Thus, we will need to monitor levels regularly (every 3-4 mo on average) to keep levels within normal limits and prevent over supplementation.   Hypertension associated with diabetes (HCC) Assessment: Condition is Controlled.. This is being controlled with Norvasc only. She tolerates these well and denies any unwanted side effects. Pt is asymptomatic and denies any hypertensive episodes.  Last 3 blood pressure readings in our office are as follows: BP Readings from Last 3 Encounters:  05/13/23 132/77  04/22/23 138/81  04/07/23 (!) 141/79   Plan: - Continue with Norvasc at current dose as directed by prescriber.   Sylvia [name] BP is 132/77 at goal today.   - Lifestyle changes such as following our low salt, heart healthy meal plan and engaging in a regular exercise program discussed   - Ambulatory and sedentary blood pressure monitoring encouraged.  - We will continue to monitor closely alongside PCP/ specialists.  Pt reminded to also f/up with those individuals as instructed by them.    New onset type 2 diabetes mellitus (HCC) Assessment: Condition is Not optimized.. This is currently being treated with Metformin. Pt has been compliant with taking this and denies  any GI upset or N/V/D. Pt endorses her hunger and cravings are controlled when eating everything on the prescribed meal plan.  Lab Results  Component Value Date   HGBA1C 7.0 (H) 03/24/2023   HGBA1C 5.2 02/09/2012   INSULIN 12.0 03/24/2023    Plan: - Continue Metformin at current dose BID.   - Hypoglycemia prevention discussed with the patient.  Eat on a regular basis- no skipping or going long periods without eating.      - Unless pre-existing renal or cardiopulmonary conditions exist which patient was told to limit their fluid intake by another provider, I recommended roughly one half of their weight in pounds, to be the approximate ounces of non-caloric, non-caffeinated beverages they should drink per day; including more if they are engaging in exercise. Increasing water intake and physical activity will help alleviate constipation symptoms.   - Recheck labs in 3 months if not done at Endo provider / PCP.     TREATMENT PLAN FOR OBESITY: BMI 40.0-44.9, adult (HCC) Current BMI 42.38 Obesity (HCC)- Start BMI 43.6 Date 03/24/2023 Assessment:  Sylvia [name] is here to discuss her progress with her obesity treatment plan along with follow-up of her obesity related diagnoses. See Medical Weight Management Flowsheet for complete bioelectrical impedance results.  Condition is not optimized. Biometric data collected today, was reviewed with patient.   Since last office visit on 04/22/2023 patient's  Muscle mass has decreased by 2.8lb. Fat mass has decreased by 0.8lb. Total body water has increased by 2.8lb.  Counseling done on how various foods will affect these numbers and how  to maximize success  Total lbs lost to date: 7 Total weight loss percentage to date: 2.76%   Plan: - [place]ah will continue to work on healthier eating habits and follow the Category 1 Plan and keeping a food journal and adhering to recommended goals of 1050-1150 calories and 80+ protein.  - I advised if she wants to  skip a meal she is allowed to have 1 protein shake a day.   - I recommended Ghughes sugar freeBBQ sauce.   Behavioral Intervention Additional resources provided today:  food log Evidence-based interventions for health behavior change were utilized today including the discussion of self monitoring techniques, problem-solving barriers and SMART goal setting techniques.   Regarding patient's less desirable eating habits and patterns, we employed the technique of small changes.  Pt will specifically work on: Increase water intake to 75oz minimum for next visit.     She has agreed to Think about enjoyable ways to increase daily physical activity and overcoming barriers to exercise and Increase physical activity in their day and reduce sedentary time (increase NEAT).   FOLLOW UP: Return in about 2 weeks (around 05/27/2023).  She was informed of the importance of frequent follow up visits to maximize her success with intensive lifestyle modifications for her multiple health conditions.  Subjective:   Chief complaint: Obesity Sylvia [name] is here to discuss her progress with her obesity treatment plan. She is on the Category 1 Plan and keeping a food journal and adhering to recommended goals of 1100 calories and 85+ protein and states she is following her eating plan approximately 100% of the time. She states she is not exercising.  Interval History:  Sylvia [name] is here for a follow up office visit.     Since last office visit:  Since last OV she has been well. Pt has made a point on following the prescribed meal plan "exactly to the T". She has made a conscious effort to journal consistently but feels as this is a lot of writing and would prefer using [employer]. She has had 3 celebration events since last OV and to prevent her from eating off plan she eats before the even and brings in her own healthy foods. She notes struggling with her water intake as she has cold sensitivity and this triggers  it. To combat this she tries to drink hot water or hot water with green tea. She was recommended fairlife milk at her last visit and enjoys this. On a couple of days she had 1245cal with 69g, 1017cal with 87g, and 1070cal with 68g of protein. Her daughter informed me that she forgets to eat on some days after work. Pt states that some days she forgets to eat due to work, not feeling well, or business. She informed me that she has changed her sleep patterns and she now uses a CPAP machine. She has a follow-up on this later this month. Pt also keeps a log day to day writing down how she feels, how this was effected, and how this can be improved. She will take this log and discuss this with her therapist.   We reviewed her meal plan and all questions were answered.   Pharmacotherapy for weight loss: She is currently taking  metformin   for medical weight loss.  Denies side effects.    Review of Systems:  Pertinent positives were addressed with patient today.  Reviewed by clinician on day of visit: allergies, medications, problem list, medical history, surgical history, family history, social  history, and previous encounter notes.  Weight Summary and Biometrics   Weight Lost Since Last Visit: 4lb  Weight Gained Since Last Visit: 0lb   Vitals Temp: 99.3 F (37.4 C) BP: 132/77 Pulse Rate: 86 SpO2: 99 %   Anthropometric Measurements Height: 5\' 4"  (1.626 m) Weight: 247 lb (112 kg) BMI (Calculated): 42.38 Weight at Last Visit: 251lb Weight Lost Since Last Visit: 4lb Weight Gained Since Last Visit: 0lb Starting Weight: 254 lb Total Weight Loss (lbs): 7 lb (3.175 kg) Peak Weight: 279 lb   Body Composition  Body Fat %: 50.9 % Fat Mass (lbs): 126 lbs Muscle Mass (lbs): 115.2 lbs Total Body Water (lbs): 91 lbs Visceral Fat Rating : 16   Other Clinical Data Fasting: no Labs: no Today's Visit #: 4 Starting Date: 03/24/23   Objective:   PHYSICAL EXAM: Blood pressure 132/77,  pulse 86, temperature 99.3 F (37.4 C), height 5\' 4"  (1.626 m), weight 247 lb (112 kg), last menstrual period 05/24/2018, SpO2 99%. Body mass index is 42.4 kg/m.  General: Well Developed, well nourished, and in no acute distress.  HEENT: Normocephalic, atraumatic Skin: Warm and dry, cap RF less 2 sec, good turgor Chest:  Normal excursion, shape, no gross abn Respiratory: speaking in full sentences, no conversational dyspnea NeuroM-Sk: Ambulates w/o assistance, moves * 4 Psych: A and O *3, insight good, mood-full  DIAGNOSTIC DATA REVIEWED:  BMET    Component Value Date/Time   NA 142 03/24/2023 0910   K 4.0 03/24/2023 0910   CL 102 03/24/2023 0910   CO2 25 03/24/2023 0910   GLUCOSE 104 (H) 03/24/2023 0910   GLUCOSE 102 (H) 06/11/2018 1140   BUN 12 03/24/2023 0910   CREATININE 0.64 03/24/2023 0910   CALCIUM 9.4 03/24/2023 0910   GFRNONAA >60 06/11/2018 1140   GFRAA >60 06/11/2018 1140   Lab Results  Component Value Date   HGBA1C 7.0 (H) 03/24/2023   HGBA1C 5.2 02/09/2012   Lab Results  Component Value Date   INSULIN 12.0 03/24/2023   Lab Results  Component Value Date   TSH 1.730 03/24/2023   CBC    Component Value Date/Time   WBC 10.4 02/17/2023 0937   RBC 4.60 02/17/2023 0937   HGB 13.8 02/17/2023 0937   HCT 43.2 02/17/2023 0937   PLT 354 02/17/2023 0937   MCV 93.9 02/17/2023 0937   MCH 30.0 02/17/2023 0937   MCHC 31.9 02/17/2023 0937   RDW 13.5 02/17/2023 0937   Iron Studies No results found for: "IRON", "TIBC", "FERRITIN", "IRONPCTSAT" Lipid Panel     Component Value Date/Time   CHOL 198 03/24/2023 0910   TRIG 141 03/24/2023 0910   HDL 62 03/24/2023 0910   LDLCALC 111 (H) 03/24/2023 0910   Hepatic Function Panel     Component Value Date/Time   PROT 6.6 03/24/2023 0910   ALBUMIN 3.9 03/24/2023 0910   AST 15 03/24/2023 0910   ALT 22 03/24/2023 0910   ALKPHOS 106 03/24/2023 0910   BILITOT 0.3 03/24/2023 0910      Component Value Date/Time    TSH 1.730 03/24/2023 0910   Nutritional Lab Results  Component Value Date   VD25OH 25.3 (L) 03/24/2023    Attestations:   This encounter took 60 total minutes of time including any pre-visit and post-visit time spent on this date of service, including taking a thorough history, reviewing any labs and/or imaging, reviewing prior notes, as well as documenting in the electronic health record on the date of service.  Over 50% of that time was in direct face-to-face counseling and coordinating care for the patient today  I, Clinical biochemist, acting as a Stage manager for [employer], DO., have compiled all relevant documentation for today's office visit on behalf of Thomasene Lot, DO, while in the presence of [employer], DO.  I have reviewed the above documentation for accuracy and completeness, and I agree with the above. Sylvia [name], D.O.  The 21st Century Cures Act was signed into law in 2016 which includes the topic of electronic health records.  This provides immediate access to information in MyChart.  This includes consultation notes, operative notes, office notes, lab results and pathology reports.  If you have any questions about what you read please let us know at your next visit so we can discuss your concerns and take corrective action if need be.  We are right here with you.

## 2023-05-22 DIAGNOSIS — J301 Allergic rhinitis due to pollen: Secondary | ICD-10-CM | POA: Diagnosis not present

## 2023-05-22 DIAGNOSIS — J3081 Allergic rhinitis due to animal (cat) (dog) hair and dander: Secondary | ICD-10-CM | POA: Diagnosis not present

## 2023-05-22 DIAGNOSIS — J3089 Other allergic rhinitis: Secondary | ICD-10-CM | POA: Diagnosis not present

## 2023-05-25 ENCOUNTER — Other Ambulatory Visit (INDEPENDENT_AMBULATORY_CARE_PROVIDER_SITE_OTHER): Payer: Self-pay | Admitting: Physician Assistant

## 2023-05-25 DIAGNOSIS — E559 Vitamin D deficiency, unspecified: Secondary | ICD-10-CM

## 2023-05-28 ENCOUNTER — Encounter (INDEPENDENT_AMBULATORY_CARE_PROVIDER_SITE_OTHER): Payer: Self-pay | Admitting: Family Medicine

## 2023-05-28 ENCOUNTER — Ambulatory Visit (INDEPENDENT_AMBULATORY_CARE_PROVIDER_SITE_OTHER): Payer: BC Managed Care – PPO | Admitting: Family Medicine

## 2023-05-28 VITALS — BP 138/83 | HR 81 | Temp 98.4°F | Ht 64.0 in | Wt 248.0 lb

## 2023-05-28 DIAGNOSIS — Z6841 Body Mass Index (BMI) 40.0 and over, adult: Secondary | ICD-10-CM | POA: Diagnosis not present

## 2023-05-28 DIAGNOSIS — E559 Vitamin D deficiency, unspecified: Secondary | ICD-10-CM

## 2023-05-28 DIAGNOSIS — F430 Acute stress reaction: Secondary | ICD-10-CM | POA: Diagnosis not present

## 2023-05-28 DIAGNOSIS — E669 Obesity, unspecified: Secondary | ICD-10-CM

## 2023-05-28 DIAGNOSIS — G4733 Obstructive sleep apnea (adult) (pediatric): Secondary | ICD-10-CM | POA: Diagnosis not present

## 2023-05-28 MED ORDER — VITAMIN D (ERGOCALCIFEROL) 1.25 MG (50000 UNIT) PO CAPS
50000.0000 [IU] | ORAL_CAPSULE | ORAL | 0 refills | Status: DC
Start: 2023-05-28 — End: 2023-06-16

## 2023-05-28 NOTE — Progress Notes (Signed)
Sylvia [name], D.O.  ABFM, ABOM Specializing in Clinical Bariatric Medicine  Office located at: [address] [place], [place]  [address]   Assessment and Plan:   FOR THE DISEASE OF OBESITY: BMI 40.0-44.9, adult (HCC) Current BMI 42.55 Obesity (HCC)- Start BMI 43.6 Date 03/24/2023 Assessment & Plan: Since last office visit on 05/13/23 patient's , muscle mass has increased by 0.4 lb. Fat mass has increased by 0.6 lb. Total body water has increased by 0.4 lb.  Counseling done on how various foods will affect these numbers and how to maximize success  Total lbs lost to date: 6 lbs  Total weight loss percentage to date: 2.38%    Recommended Dietary Goals Sylvia [name] is currently in the action stage of change. As such, her goal is to continue weight management plan.  She has agreed to: continue current plan   Behavioral Intervention We discussed the following today: work on managing stress, creating time for self-care and relaxation and celebration eating strategies  Additional resources provided today:  handouts on various low calorie, low carb, high protein recipes and handout on Thanksgiving eating strategies   Evidence-based interventions for health behavior change were utilized today including the discussion of self monitoring techniques, problem-solving barriers and SMART goal setting techniques.   Regarding patient's less desirable eating habits and patterns, we employed the technique of small changes.   Pt will specifically work on: deep breathing for stress management 10 minutes 2x daily.    Recommended Physical Activity Goals Shmya has been advised to work up to 150 minutes of moderate intensity aerobic activity a week and strengthening exercises 2-3 times per week for cardiovascular health, weight loss maintenance and preservation of muscle mass.   She has agreed to : Continue current level of physical activity    Pharmacotherapy We both agreed to : continue  current anti-obesity medication regimen; will consider initiating Mounjaro after the New Year.    FOR ASSOCIATED CONDITIONS ADDRESSED TODAY:   Vitamin D deficiency Assessment & Plan: Most recent vit D:  Lab Results  Component Value Date   VD25OH 25.3 (L) 03/24/2023   She is currently on ERGO 50,000 units a week without any adverse SE. She will continue with weight loss efforts and high dose vitamin D. Recheck vitamin D in 1-2 mos.   Orders:  -     Vitamin D (Ergocalciferol); Take 1 capsule (50,000 Units total) by mouth every 7 (seven) days.  Dispense: 4 capsule; Refill: 0   Acute reaction to situational stress Assessment & Plan: She endorses being under stress due to deaths in the family as well as work dynamics. She has not been doing any emotional eating. In fact, she is doing very well with adhering to her journaling parameters.   Behavior modification techniques were discussed today to help deal with stress, such as increasing exercise and meditating twice daily. Reminded patient of the importance of following their prudent nutrition plan and how food can affect mood as well to support emotional wellbeing.    FOLLOW UP: Return 06/16/23. She was informed of the importance of frequent follow up visits to maximize her success with intensive lifestyle modifications for her multiple health conditions.  Subjective:   Chief complaint: Obesity [name] is here to discuss her progress with her obesity treatment plan. She is on the Category 1 Plan and keeping a food journal and adhering to recommended goals of 1050-1150 calories and 80+ protein and states she is following her eating plan approximately  99% of the time. She states she is not exercising.  Interval History:  Sylvia [name] is here for a follow up office visit. Since last OV,  Sylvia [name] is up 1 lb. She endorses drinking roughly 70 ounces of water daily. Per her journaling log, she is adhering to journaling parameters essentially  every day. Hunger and cravings are pretty well controlled. She does report being under a lot of stress due to deaths in the family.   Barriers identified: none  Pharmacotherapy for weight loss: She is currently taking  Metformin 500 mg tid  .   Review of Systems:  Pertinent positives were addressed with patient today.  Reviewed by clinician on day of visit: allergies, medications, problem list, medical history, surgical history, family history, social history, and previous encounter notes.  Weight Summary and Biometrics   Weight Lost Since Last Visit: 0lb  Weight Gained Since Last Visit: 1lb   Vitals Temp: 98.4 F (36.9 C) BP: 138/83 Pulse Rate: 81 SpO2: 98 %   Anthropometric Measurements Height: 5\' 4"  (1.626 m) Weight: 248 lb (112.5 kg) BMI (Calculated): 42.55 Weight at Last Visit: 247lb Weight Lost Since Last Visit: 0lb Weight Gained Since Last Visit: 1lb Starting Weight: 254lb Total Weight Loss (lbs): 6 lb (2.722 kg) Peak Weight: 279lb   Body Composition  Body Fat %: 51 % Fat Mass (lbs): 126.6 lbs Muscle Mass (lbs): 115.6 lbs Total Body Water (lbs): 91.4 lbs Visceral Fat Rating : 16   Other Clinical Data Fasting: no Labs: no Today's Visit #: 5 Starting Date: 03/24/23   Objective:   PHYSICAL EXAM: Blood pressure 138/83, pulse 81, temperature 98.4 F (36.9 C), height 5\' 4"  (1.626 m), weight 248 lb (112.5 kg), last menstrual period 05/24/2018, SpO2 98%. Body mass index is 42.57 kg/m.  General: she is overweight, cooperative and in no acute distress. PSYCH: Has normal mood, affect and thought process.   HEENT: EOMI, sclerae are anicteric. Lungs: Normal breathing effort, no conversational dyspnea. Extremities: Moves * 4 Neurologic: A and O * 3, good insight  DIAGNOSTIC DATA REVIEWED: BMET    Component Value Date/Time   NA 142 03/24/2023 0910   K 4.0 03/24/2023 0910   CL 102 03/24/2023 0910   CO2 25 03/24/2023 0910   GLUCOSE 104 (H) 03/24/2023  0910   GLUCOSE 102 (H) 06/11/2018 1140   BUN 12 03/24/2023 0910   CREATININE 0.64 03/24/2023 0910   CALCIUM 9.4 03/24/2023 0910   GFRNONAA >60 06/11/2018 1140   GFRAA >60 06/11/2018 1140   Lab Results  Component Value Date   HGBA1C 7.0 (H) 03/24/2023   HGBA1C 5.2 02/09/2012   Lab Results  Component Value Date   INSULIN 12.0 03/24/2023   Lab Results  Component Value Date   TSH 1.730 03/24/2023   CBC    Component Value Date/Time   WBC 10.4 02/17/2023 0937   RBC 4.60 02/17/2023 0937   HGB 13.8 02/17/2023 0937   HCT 43.2 02/17/2023 0937   PLT 354 02/17/2023 0937   MCV 93.9 02/17/2023 0937   MCH 30.0 02/17/2023 0937   MCHC 31.9 02/17/2023 0937   RDW 13.5 02/17/2023 0937   Iron Studies No results found for: "IRON", "TIBC", "FERRITIN", "IRONPCTSAT" Lipid Panel     Component Value Date/Time   CHOL 198 03/24/2023 0910   TRIG 141 03/24/2023 0910   HDL 62 03/24/2023 0910   LDLCALC 111 (H) 03/24/2023 0910   Hepatic Function Panel     Component Value Date/Time  PROT 6.6 03/24/2023 0910   ALBUMIN 3.9 03/24/2023 0910   AST 15 03/24/2023 0910   ALT 22 03/24/2023 0910   ALKPHOS 106 03/24/2023 0910   BILITOT 0.3 03/24/2023 0910      Component Value Date/Time   TSH 1.730 03/24/2023 0910   Nutritional Lab Results  Component Value Date   VD25OH 25.3 (L) 03/24/2023    Attestations:   I, Special Puri, acting as a Stage manager for Thomasene Lot, DO., have compiled all relevant documentation for today's office visit on behalf of Thomasene Lot, DO, while in the presence of [employer], DO.  Patient was in the office today and time spent on visit including pre-visit chart review and post-visit care/coordination of care and electronic medical record documentation was 40 minutes. 50% of the time was in face to face counseling of this patient's medical condition(s) and providing education on treatment options to include the first-line treatment of diet and lifestyle  modification.  I have reviewed the above documentation for accuracy and completeness, and I agree with the above. Sylvia [name], D.O.  The 21st Century Cures Act was signed into law in 2016 which includes the topic of electronic health records.  This provides immediate access to information in MyChart.  This includes consultation notes, operative notes, office notes, lab results and pathology reports.  If you have any questions about what you read please let us know at your next visit so we can discuss your concerns and take corrective action if need be.  We are right here with you.

## 2023-05-29 DIAGNOSIS — J3089 Other allergic rhinitis: Secondary | ICD-10-CM | POA: Diagnosis not present

## 2023-05-29 DIAGNOSIS — J301 Allergic rhinitis due to pollen: Secondary | ICD-10-CM | POA: Diagnosis not present

## 2023-05-29 DIAGNOSIS — J3081 Allergic rhinitis due to animal (cat) (dog) hair and dander: Secondary | ICD-10-CM | POA: Diagnosis not present

## 2023-06-01 DIAGNOSIS — F4320 Adjustment disorder, unspecified: Secondary | ICD-10-CM | POA: Diagnosis not present

## 2023-06-10 DIAGNOSIS — F4320 Adjustment disorder, unspecified: Secondary | ICD-10-CM | POA: Diagnosis not present

## 2023-06-11 DIAGNOSIS — J301 Allergic rhinitis due to pollen: Secondary | ICD-10-CM | POA: Diagnosis not present

## 2023-06-11 DIAGNOSIS — J3089 Other allergic rhinitis: Secondary | ICD-10-CM | POA: Diagnosis not present

## 2023-06-11 DIAGNOSIS — J3081 Allergic rhinitis due to animal (cat) (dog) hair and dander: Secondary | ICD-10-CM | POA: Diagnosis not present

## 2023-06-16 ENCOUNTER — Ambulatory Visit (INDEPENDENT_AMBULATORY_CARE_PROVIDER_SITE_OTHER): Payer: BC Managed Care – PPO | Admitting: Physician Assistant

## 2023-06-16 ENCOUNTER — Encounter (INDEPENDENT_AMBULATORY_CARE_PROVIDER_SITE_OTHER): Payer: Self-pay | Admitting: Physician Assistant

## 2023-06-16 VITALS — BP 127/76 | HR 90 | Temp 97.9°F | Ht 64.0 in | Wt 242.0 lb

## 2023-06-16 DIAGNOSIS — Z6841 Body Mass Index (BMI) 40.0 and over, adult: Secondary | ICD-10-CM

## 2023-06-16 DIAGNOSIS — I152 Hypertension secondary to endocrine disorders: Secondary | ICD-10-CM

## 2023-06-16 DIAGNOSIS — E1159 Type 2 diabetes mellitus with other circulatory complications: Secondary | ICD-10-CM

## 2023-06-16 DIAGNOSIS — E785 Hyperlipidemia, unspecified: Secondary | ICD-10-CM | POA: Diagnosis not present

## 2023-06-16 DIAGNOSIS — E1169 Type 2 diabetes mellitus with other specified complication: Secondary | ICD-10-CM

## 2023-06-16 DIAGNOSIS — E559 Vitamin D deficiency, unspecified: Secondary | ICD-10-CM

## 2023-06-16 DIAGNOSIS — E119 Type 2 diabetes mellitus without complications: Secondary | ICD-10-CM

## 2023-06-16 DIAGNOSIS — E669 Obesity, unspecified: Secondary | ICD-10-CM

## 2023-06-16 MED ORDER — VITAMIN D (ERGOCALCIFEROL) 1.25 MG (50000 UNIT) PO CAPS
50000.0000 [IU] | ORAL_CAPSULE | ORAL | 0 refills | Status: DC
Start: 2023-06-16 — End: 2023-07-13

## 2023-06-16 NOTE — Progress Notes (Signed)
SUBJECTIVE:  Chief Complaint: Obesity  Interim History: She is down 6 lbs since her last visit.  Down 12 lbs overall TBW loss of 4.7% since 03/24/23  Sylvia [name], a 52 year old female with obesity, new onset type 2 diabetes, hyperlipidemia, hypertension, and vitamin D deficiency, presents for a follow-up visit regarding her obesity treatment plan. She has been successful in her weight loss efforts, losing an additional six pounds since the last visit, totaling a twelve-pound weight loss overall. Her adipose mass has decreased from 126.6 lbs to 120.4 lbs since the last visit on November 21st.  Despite recent life stressors, including a death in the family, serious illness, and work-related stress, she has managed to maintain her weight loss efforts. She reports some days of not drinking enough water and having to make quick food decisions while on the road. However, she has been mindful of her food choices, even under stress.  She has been strategic about her food intake, especially when she knows she will be in situations where healthy food options may be limited. She has been focusing on high fiber foods like spaghetti squash, but also mindful of the carbohydrate content. She has also noticed that she was not accounting for the calories in sugar-free gum and Crystal Light, which she has been consuming regularly.  She has a vacation planned in the mountains after Christmas, which she is looking forward to. She plans to continue her mindful eating strategies during the holiday season and her vacation. She has an appointment scheduled for January 6th and another one on January 29th for continued follow-up on her obesity treatment plan.  Sylvia [name] is here to discuss her progress with her obesity treatment plan. She is on the Category 1 Plan and keeping a food journal and adhering to recommended goals of 1050-1150 calories and 80 grams of protein and states she is following her eating plan  approximately 75 % of the time. She states she is exercising light weights/light walking 15-20  minutes 3 times per week.   OBJECTIVE: Visit Diagnoses: Problem List Items Addressed This Visit     New onset type 2 diabetes mellitus (HCC) - Primary   Hypertension associated with diabetes (HCC)   Vitamin D deficiency - new onset   Relevant Medications   Vitamin D, Ergocalciferol, (DRISDOL) 1.25 MG (50000 UNIT) CAPS capsule   Obesity (HCC)- Start BMI 43.6 Date 03/24/2023   BMI 40.0-44.9, adult (HCC) Current BMI 43.1  Obesity Continued weight loss with a total of 12 pounds lost. Reduction in adipose tissue from 126.6 to 120.4 since the last visit on November 21. Maintaining muscle mass well despite weight loss. Managing stress and life events well. Discussed strategies for mindful eating and portion control during holidays, including protein loading before events and using high-fiber foods like spaghetti squash. Emphasized the importance of logging caloric intake from gum and Crystal Light. - Continue current weight loss plan - Encourage mindful eating and portion control during holidays - Advise protein loading before events to manage hunger - Recommend high-fiber foods like spaghetti squash - Monitor and log caloric intake from gum and Crystal Light  Type 2 Diabetes Mellitus Lab Results  Component Value Date   HGBA1C 7.0 (H) 03/24/2023   HGBA1C 5.2 02/09/2012   Lab Results  Component Value Date   LDLCALC 111 (H) 03/24/2023   CREATININE 0.64 03/24/2023  No ARB/No ACE/No statin therapy.   New onset type 2 diabetes. Managing diet well despite stress and life events. Discussed the importance  of maintaining dietary modifications to manage blood glucose levels. She is working  on nutrition plan to decrease simple carbohydrates, increase lean proteins and exercise to promote weight loss and improve glycemic control . Plan to recheck labs over the next 1-2 months and consider use of Mounjaro  after the new year.   Hyperlipidemia The 10-year ASCVD risk score (Arnett DK, et al., 2019) is: 7.8%   Values used to calculate the score:     Age: 16 years     Sex: Female     Is Non-Hispanic African American: Yes     Diabetic: Yes     Tobacco smoker: No     Systolic Blood Pressure: 127 mmHg     Is BP treated: Yes     HDL Cholesterol: 62 mg/dL     Total Cholesterol: 198 mg/dL  Managing diet well, which is crucial for controlling lipid levels. Discussed the importance of dietary modifications to manage lipid levels. Continue to work on nutrition plan -decreasing simple carbohydrates, increasing lean proteins, decreasing saturated fats and cholesterol , avoiding trans fats and exercise as able to promote weight loss, improve lipids and decrease cardiovascular risks. Recheck lipids in new year. May benefit from statin as well as consideration for GLP-1 RA therapy to lower CV risks.    Hypertension Hypertension asymptomatic, reasonably well controlled, and no significant medication side effects noted.  Medication(s): Norvasc 10 mg daily   Renal function in normal range.   BP Readings from Last 3 Encounters:  06/16/23 127/76  05/28/23 138/83  05/13/23 132/77   Lab Results  Component Value Date   CREATININE 0.64 03/24/2023   CREATININE 0.70 01/22/2023   CREATININE 0.71 06/11/2018   No results found for: "GFR"  Plan: Continue all antihypertensives at current dosages. Managing stress and diet, which are important for blood pressure control. Discussed the importance of monitoring blood pressure and maintaining dietary modifications. - Continue monitoring blood pressure Continue to work on nutrition plan to promote weight loss and improve BP control.     Vitamin D Deficiency Vitamin D is not at goal of 50.  Most recent vitamin D level was 25.3. She is on  prescription ergocalciferol 50,000 IU weekly. Lab Results  Component Value Date   VD25OH 25.3 (L) 03/24/2023     Plan: Continue and refill  prescription ergocalciferol 50,000 IU weekly Low vitamin D levels can be associated with adiposity and may result in leptin resistance and weight gain. Also associated with fatigue. Currently on vitamin D supplementation without any adverse effects.  Recheck vitamin D level over next 1-2 months to optimize supplementation/avoid over supplementation.     Vitals Temp: 97.9 F (36.6 C) BP: 127/76 Pulse Rate: 90 SpO2: 99 %   Anthropometric Measurements Height: 5\' 4"  (1.626 m) Weight: 242 lb (109.8 kg) BMI (Calculated): 41.52 Weight at Last Visit: 248lb Weight Lost Since Last Visit: 6lb Weight Gained Since Last Visit: 0 Starting Weight: 254lb Total Weight Loss (lbs): 12 lb (5.443 kg) Peak Weight: 279lb   Body Composition  Body Fat %: 49.7 % Fat Mass (lbs): 120.4 lbs Muscle Mass (lbs): 115.6 lbs Total Body Water (lbs): 89 lbs Visceral Fat Rating : 15   Other Clinical Data Fasting: no Labs: no Today's Visit #: 6 Starting Date: 03/24/23     ASSESSMENT AND PLAN:  Diet: Janajah is currently in the action stage of change. As such, her goal is to continue with weight loss efforts. She has agreed to Category 1 Plan and keeping  a food journal and adhering to recommended goals of 1050-1150 calories and 80 grams of protein.  Exercise: Shellena has been instructed to continue exercising as is for weight loss and overall health benefits.   Behavior Modification:  We discussed the following Behavioral Modification Strategies today: increasing lean protein intake, decreasing simple carbohydrates, increasing vegetables, increase H2O intake, increase high fiber foods, holiday eating strategies, celebration eating strategies, and planning for success. We discussed various medication options to help Rhena with her weight loss efforts and we both agreed to continue current medications for Type 2 diabetes management and possibly consider Mounjaro after the new  year.  Return in about 4 weeks (around 07/14/2023).Marland Kitchen She was informed of the importance of frequent follow up visits to maximize her success with intensive lifestyle modifications for her multiple health conditions.  Attestation Statements:   Reviewed by clinician on day of visit: allergies, medications, problem list, medical history, surgical history, family history, social history, and previous encounter notes.   Time spent on visit including pre-visit chart review and post-visit care and charting was 39 minutes.    Inocencio Roy, PA-C

## 2023-06-18 DIAGNOSIS — J301 Allergic rhinitis due to pollen: Secondary | ICD-10-CM | POA: Diagnosis not present

## 2023-06-18 DIAGNOSIS — F4320 Adjustment disorder, unspecified: Secondary | ICD-10-CM | POA: Diagnosis not present

## 2023-06-18 DIAGNOSIS — J3081 Allergic rhinitis due to animal (cat) (dog) hair and dander: Secondary | ICD-10-CM | POA: Diagnosis not present

## 2023-06-18 DIAGNOSIS — J3089 Other allergic rhinitis: Secondary | ICD-10-CM | POA: Diagnosis not present

## 2023-06-24 DIAGNOSIS — F4320 Adjustment disorder, unspecified: Secondary | ICD-10-CM | POA: Diagnosis not present

## 2023-06-26 ENCOUNTER — Encounter: Payer: Self-pay | Admitting: Pulmonary Disease

## 2023-06-26 ENCOUNTER — Ambulatory Visit: Payer: BC Managed Care – PPO | Admitting: Pulmonary Disease

## 2023-06-26 VITALS — BP 128/76 | HR 87 | Temp 97.8°F | Ht 64.0 in | Wt 247.8 lb

## 2023-06-26 DIAGNOSIS — J3081 Allergic rhinitis due to animal (cat) (dog) hair and dander: Secondary | ICD-10-CM | POA: Diagnosis not present

## 2023-06-26 DIAGNOSIS — J3089 Other allergic rhinitis: Secondary | ICD-10-CM | POA: Diagnosis not present

## 2023-06-26 DIAGNOSIS — J301 Allergic rhinitis due to pollen: Secondary | ICD-10-CM | POA: Diagnosis not present

## 2023-06-26 DIAGNOSIS — J4550 Severe persistent asthma, uncomplicated: Secondary | ICD-10-CM

## 2023-06-26 LAB — NITRIC OXIDE: Nitric Oxide: 18

## 2023-06-26 NOTE — Progress Notes (Signed)
Synopsis: Referred in by Sigmund Hazel, MD   Subjective:   PATIENT ID: Sylvia [name] GENDER: female DOB: [birth year], MRN: [id number]  Chief Complaint  Patient presents with   Follow-up    Breathing has improved. Doing well with Dupixent.     HPI Sylvia [name] is a pleasant 52 year old female patient with a past medical history of severe persistent asthma on Trelegy presenting to the pulmonary office for worsening asthma control.  She reports that she was diagnosed with asthma at a young age and was hospitalized multiple times in the past but never required intubation.  Last being 6 to 7 years ago.  About a month prior to presentation she started having shortness of breath chest tightness on a daily basis and had to use her rescue inhaler multiple times a day that was associated with coughing spells at night with wheezing.  She went to the urgent care clinic in July and was prescribed prednisone.  It did help however she was never back to baseline.  She went to her allergist Dr. Reeds Callas who prescribed another round of prednisone with same response.  She denies any heartburn denies any acid taste in the mouth.  She does snore at night per her husband and feels fatigued during the day.  She denies any weight loss loss of appetite night sweats. She has a history of ectopic dermatitis and allergic rhinitis.    In the interim, she underwent PFTs that showed mild restriction with normal DLCO. Her Eosinophil count was 200 and IgE total count was a 1000. Antigen panel was + for multiple antigens highest for Dog Dander. She also underwent a sleep study on 09/18 with moderate sleep apnea AHI 15. Currently on Trelegy 200, Airsupra as needed and dupixent.    Started on dupixent 04/2023 and CPAP 04/2023. Today reports feeling better using rescue inhaler very rarely once a week to once every 2 weeks.   Family history: Daughter with asthma   Social history: Never smoker, denies alcohol use or illicit  drug use. Worked as Air cabin crew. Does not have any pets at home.   ROS All systems were reviewed and are negative except for the above.  Objective:   Vitals:   06/26/23 0838  BP: 128/76  Pulse: 87  Temp: 97.8 F (36.6 C)  TempSrc: Temporal  SpO2: 100%  Weight: 247 lb 12.8 oz (112.4 kg)  Height: 5\' 4"  (1.626 m)   100% on RA BMI Readings from Last 3 Encounters:  06/26/23 42.53 kg/m  06/16/23 41.54 kg/m  05/28/23 42.57 kg/m   Wt Readings from Last 3 Encounters:  06/26/23 247 lb 12.8 oz (112.4 kg)  06/16/23 242 lb (109.8 kg)  05/28/23 248 lb (112.5 kg)    Physical Exam GEN: NAD, Healthy Appearing HEENT: Supple Neck, Reactive Pupils, possible nasal polyp seen. CVS: Normal S1, Normal S2, RRR, No murmurs or ES appreciated  Lungs: Faint expiratory wheezing.  Abdomen: Soft, non tender, non distended, + BS  Extremities: Warm and well perfused, No edema  Skin: No suspicious lesions appreciated  Psych: Normal Affect  Ancillary Information   CBC    Component Value Date/Time   WBC 10.4 02/17/2023 0937   RBC 4.60 02/17/2023 0937   HGB 13.8 02/17/2023 0937   HCT 43.2 02/17/2023 0937   PLT 354 02/17/2023 0937   MCV 93.9 02/17/2023 0937   MCH 30.0 02/17/2023 0937   MCHC 31.9 02/17/2023 0937   RDW 13.5 02/17/2023 0937   LYMPHSABS 2.4 02/17/2023  7829   MONOABS 0.9 02/17/2023 0937   EOSABS 0.2 02/17/2023 0937   BASOSABS 0.1 02/17/2023 0937    Imaging  CXR 01/15/2023: No active cardiopulmonary disease  CTA Chest 01/22/2023: No acute intrathoracic findings.    Echocardiogram 03/06/23  1. Left ventricular ejection fraction, by estimation, is 60 to 65%. The  left ventricle has normal function. The left ventricle has no regional  wall motion abnormalities. Left ventricular diastolic parameters were  normal.   2. Right ventricular systolic function is normal. The right ventricular  size is normal. There is normal pulmonary artery systolic pressure. The   estimated right ventricular systolic pressure is 19.1 mmHg.   3. The mitral valve is normal in structure. Mild mitral valve  regurgitation. No evidence of mitral stenosis.   4. The aortic valve is normal in structure. Aortic valve regurgitation is  not visualized. Aortic valve sclerosis is present, with no evidence of  aortic valve stenosis.   5. The inferior vena cava is normal in size with greater than 50%  respiratory variability, suggesting right atrial pressure of 3 mmHg.     Latest Ref Rng & Units 03/12/2023    8:13 AM  PFT Results  FVC-Pre L 2.50   FVC-Predicted Pre % 71   FVC-Post L 2.32   FVC-Predicted Post % 65   Pre FEV1/FVC % % 84   Post FEV1/FCV % % 87   FEV1-Pre L 2.09   FEV1-Predicted Pre % 75   FEV1-Post L 2.01   DLCO uncorrected ml/min/mmHg 17.37   DLCO UNC% % 82   DLVA Predicted % 118   TLC L 4.01   TLC % Predicted % 79   RV % Predicted % 90      Assessment & Plan:  Sylvia [name] is a pleasant 52 year old female patient with a past medical history of severe persistent asthma on Trelegy presenting to the pulmonary office for worsening asthma control.  #Severe Persistent Asthma, poorly controlled  ACT 12  IgE 1000.  Eos 200  []  continue with Fluticasone-Umeclidinium-Vilanterol [Trelegy] 200-62.5-25mcg 1 puff daily.  []  c/w with Albuterol-budesonide [airsupra] 90-6mcg 2puffs Q6H PRN for wheezing/sob/chest tightness  []  c w/ Dupixent Therapy (Started 04/2023) []  c/w good house care  inlcuding airpurifiers and dehumidifier.  []  C/w weightloss and healthy lifestyle.   #Allergic rhinitis with possible polyps  []  c/w Flonase ns 1 spray in each nares for 1 month  []  Avoid NSAIDs  []  Dupixent as above.   #Moderate OSA (AHI 14) Per husband she does snore at night and has some apneic episodes. She is high risk for OSA.   []  Conitnue with CPAP 8.    Return in about 3 months (around 09/24/2023).  I spent 40 minutes caring for this patient today,  including preparing to see the patient, obtaining a medical history , reviewing a separately obtained history, performing a medically appropriate examination and/or evaluation, counseling and educating the patient/family/caregiver, ordering medications, tests, or procedures, documenting clinical information in the electronic health record, and independently interpreting results (not separately reported/billed) and communicating results to the patient/family/caregiver  Janann Colonel, MD [facility] 06/26/2023 9:04 AM

## 2023-06-27 DIAGNOSIS — G4733 Obstructive sleep apnea (adult) (pediatric): Secondary | ICD-10-CM | POA: Diagnosis not present

## 2023-06-29 DIAGNOSIS — G4733 Obstructive sleep apnea (adult) (pediatric): Secondary | ICD-10-CM | POA: Diagnosis not present

## 2023-07-13 ENCOUNTER — Encounter (INDEPENDENT_AMBULATORY_CARE_PROVIDER_SITE_OTHER): Payer: Self-pay | Admitting: Family Medicine

## 2023-07-13 ENCOUNTER — Ambulatory Visit (INDEPENDENT_AMBULATORY_CARE_PROVIDER_SITE_OTHER): Payer: BC Managed Care – PPO | Admitting: Family Medicine

## 2023-07-13 VITALS — BP 154/80 | HR 92 | Temp 98.3°F | Ht 64.0 in | Wt 239.0 lb

## 2023-07-13 DIAGNOSIS — E119 Type 2 diabetes mellitus without complications: Secondary | ICD-10-CM | POA: Diagnosis not present

## 2023-07-13 DIAGNOSIS — E669 Obesity, unspecified: Secondary | ICD-10-CM

## 2023-07-13 DIAGNOSIS — E559 Vitamin D deficiency, unspecified: Secondary | ICD-10-CM | POA: Diagnosis not present

## 2023-07-13 DIAGNOSIS — Z6841 Body Mass Index (BMI) 40.0 and over, adult: Secondary | ICD-10-CM

## 2023-07-13 DIAGNOSIS — Z7984 Long term (current) use of oral hypoglycemic drugs: Secondary | ICD-10-CM

## 2023-07-13 MED ORDER — VITAMIN D (ERGOCALCIFEROL) 1.25 MG (50000 UNIT) PO CAPS: 50000.0000 [IU] | ORAL_CAPSULE | ORAL | 0 refills | Status: DC

## 2023-07-13 NOTE — Progress Notes (Signed)
 Sylvia [name], D.O.  ABFM, ABOM Specializing in Clinical Bariatric Medicine  Office located at: [address] [place], [place]  [address]   Assessment and Plan:   Pt to come fasting for labs at next OV with Shawn.   FOR THE DISEASE OF OBESITY: BMI 40.0-44.9, adult (HCC) Current BMI 41.6 Obesity, Beginning BMI 43.6 Assessment & Plan: Since last office visit on 12/10 with Shawn Rayburn, PA, patient's  Muscle mass has increased by 0.2lb. Fat mass has decreased by 3lb. Total body water  has decreased by 8.8lb.  Counseling done on how various foods will affect these numbers and how to maximize success  Total lbs lost to date: 15 lbs Total weight loss percentage to date: -5.91%   Recommended Dietary Goals Treniya is currently in the action stage of change. As such, her goal is to continue weight management plan.  She has agreed to: continue current plan   Behavioral Intervention We discussed the following today: increasing lean protein intake to established goals, increasing water  intake , decreasing eating out or consumption of processed foods, and making healthy choices when eating convenient foods, continue to practice mindfulness when eating, planning for success, better snacking choices, and focusing on food with a 10:1 ratio of calories: grams of protein Reviewed alternative recipes to favorite foods (Weight Watchers recipes),   Additional resources provided today: None  Evidence-based interventions for health behavior change were utilized today including the discussion of self monitoring techniques, problem-solving barriers and SMART goal setting techniques.   Regarding patient's less desirable eating habits and patterns, we employed the technique of small changes.   Pt will specifically work on: Journaling her goals for this year for next visit.    Recommended Physical Activity Goals Tyrene has been advised to work up to 150 minutes of moderate intensity aerobic  activity a week and strengthening exercises 2-3 times per week for cardiovascular health, weight loss maintenance and preservation of muscle mass.   She has agreed to :  Increase physical activity in their day and reduce sedentary time (increase NEAT).   Pharmacotherapy We discussed various medication options to help Ayvah with her weight loss efforts and we both agreed to : continue with nutritional and behavioral strategies, continue current anti-obesity medication regimen, and given information and education on FDA approved anti-obesity medications   FOR ASSOCIATED CONDITIONS ADDRESSED TODAY:  Vitamin D  deficiency Assessment & Plan: Lab Results  Component Value Date   VD25OH 25.3 (L) 03/24/2023   Pt last vitamin D  levels were below goal at 25.3 as of 03/24/23. Currently treating with ERGO 50K units once weekly. Tolerating well, no adverse side effects. Pt reports no acute concerns today.   Reviewed ideal vitamin levels of 50+. Discussed the importance of sufficient vitamin D  levels during the winter. Continue on current supplementation. Will recheck levels at next office visit.   Orders: Refill ERGO today.    New onset type 2 diabetes mellitus [facility]) Assessment & Plan: Lab Results  Component Value Date   HGBA1C 7.0 (H) 03/24/2023   HGBA1C 5.2 02/09/2012   INSULIN  12.0 03/24/2023   Treating T2DM with Metformin . Tolerating well with no side effects. A1C above goal as of 03/24/23. Has been prioritizing eating more protein at mealtimes. Reports some off-plan eating over the holidays. Working on meeting daily water  intake goal.   Reviewed the benefits of weight loss medications. Pt declined, stating she will continue to think about it. Extensively counseled pt on alternative high protein snacks ([employer] chips)  and seeking snacks with 10:1 calories to grams of protein. Reviewed multiple on-plan recipes. Educated pt on reading nutrition labels. Encouraged increase in lean protein intake,  regular exercise, and following her meal plan. Will recheck A1C and fasting insulin  at next OV.    Follow up:   Return in about 23 days (around 08/05/2023). She was informed of the importance of frequent follow up visits to maximize her success with intensive lifestyle modifications for her multiple health conditions.  Subjective:   Chief complaint: Obesity [name] is here to discuss her progress with her obesity treatment plan. She is on the the Category 1 Plan and keeping a food journal and adhering to recommended goals of 1050-1150 calories and 80+ grams of protein and states she is following her eating plan approximately 50% of the time. She states she is exercising 30 minutes 2 days per week.  Interval History:  Sylvia [name] is here for a follow up office visit. Since last OV, she is down 3 lbs. Had multiple celebrations in December, including her birthday, her husband's birthday, her cousin's graduation party, and the holidays in December. She reports some off-plan eating over the holidays but states she had strategies to be able to still eat healthy. Always kept a water  bottle with her. Prioritized eating lean proteins. When eating out she would eat salads with low-calorie dressings and lean meats for entries. Would walk outside whenever the weather was not too cold and would walk around the mall.   Barriers identified:  holiday celebration off-plan eating and temptations  Pharmacotherapy for weight loss: She is currently taking Metformin  (off label use for incretin effect and / or insulin  resistance and / or diabetes prevention) with adequate clinical response  and without side effects..   Review of Systems:  Pertinent positives were addressed with patient today.  Reviewed by clinician on day of visit: allergies, medications, problem list, medical history, surgical history, family history, social history, and previous encounter notes.  Weight Summary and Biometrics   Weight Lost  Since Last Visit: 3 lb  Weight Gained Since Last Visit: 0   Vitals Temp: 98.3 F (36.8 C) BP: (!) 154/80 Pulse Rate: 92 SpO2: 94 %   Anthropometric Measurements Height: 5' 4 (1.626 m) Weight: 239 lb (108.4 kg) BMI (Calculated): 41 Weight at Last Visit: 242 lb Weight Lost Since Last Visit: 3 lb Weight Gained Since Last Visit: 0 Starting Weight: 254 lb Total Weight Loss (lbs): 15 lb (6.804 kg) Peak Weight: 279 lb   Body Composition  Body Fat %: 49 % Fat Mass (lbs): 117.4 lbs Muscle Mass (lbs): 115.8 lbs Total Body Water  (lbs): 80.2 lbs Visceral Fat Rating : 15   Other Clinical Data Fasting: no Labs: no Today's Visit #: 7 Starting Date: 03/24/23    Objective:   PHYSICAL EXAM: Blood pressure (!) 154/80, pulse 92, temperature 98.3 F (36.8 C), height 5' 4 (1.626 m), weight 239 lb (108.4 kg), last menstrual period 05/24/2018, SpO2 94%. Body mass index is 41.02 kg/m.  General: she is overweight, cooperative and in no acute distress. PSYCH: Has normal mood, affect and thought process.   HEENT: EOMI, sclerae are anicteric. Lungs: Normal breathing effort, no conversational dyspnea. Extremities: Moves * 4 Neurologic: A and O * 3, good insight  DIAGNOSTIC DATA REVIEWED: BMET    Component Value Date/Time   NA 142 03/24/2023 0910   K 4.0 03/24/2023 0910   CL 102 03/24/2023 0910   CO2 25 03/24/2023 0910   GLUCOSE  104 (H) 03/24/2023 0910   GLUCOSE 102 (H) 06/11/2018 1140   BUN 12 03/24/2023 0910   CREATININE 0.64 03/24/2023 0910   CALCIUM 9.4 03/24/2023 0910   GFRNONAA >60 06/11/2018 1140   GFRAA >60 06/11/2018 1140   Lab Results  Component Value Date   HGBA1C 7.0 (H) 03/24/2023   HGBA1C 5.2 02/09/2012   Lab Results  Component Value Date   INSULIN  12.0 03/24/2023   Lab Results  Component Value Date   TSH 1.730 03/24/2023   CBC    Component Value Date/Time   WBC 10.4 02/17/2023 0937   RBC 4.60 02/17/2023 0937   HGB 13.8 02/17/2023 0937   HCT  43.2 02/17/2023 0937   PLT 354 02/17/2023 0937   MCV 93.9 02/17/2023 0937   MCH 30.0 02/17/2023 0937   MCHC 31.9 02/17/2023 0937   RDW 13.5 02/17/2023 0937   Iron Studies No results found for: IRON, TIBC, FERRITIN, IRONPCTSAT Lipid Panel     Component Value Date/Time   CHOL 198 03/24/2023 0910   TRIG 141 03/24/2023 0910   HDL 62 03/24/2023 0910   LDLCALC 111 (H) 03/24/2023 0910   Hepatic Function Panel     Component Value Date/Time   PROT 6.6 03/24/2023 0910   ALBUMIN 3.9 03/24/2023 0910   AST 15 03/24/2023 0910   ALT 22 03/24/2023 0910   ALKPHOS 106 03/24/2023 0910   BILITOT 0.3 03/24/2023 0910      Component Value Date/Time   TSH 1.730 03/24/2023 0910   Nutritional Lab Results  Component Value Date   VD25OH 25.3 (L) 03/24/2023    Attestations:   LILLETTE Vernell Forest, acting as a medical scribe for Sylvia Jenkins, DO., have compiled all relevant documentation for today's office visit on behalf of Sylvia Jenkins, DO, while in the presence of [employer], DO.  Reviewed by clinician on day of visit: allergies, medications, problem list, medical history, surgical history, family history, social history, and previous encounter notes pertinent to patient's obesity diagnosis.  I have spent 40 minutes in the care of the patient today including: preparing to see patient (e.g. review and interpretation of tests, old notes ), obtaining and/or reviewing separately obtained history, performing a medically appropriate examination or evaluation, counseling and educating the patient, ordering medications, test or procedures, documenting clinical information in the electronic or other health care record, and independently interpreting results and communicating results to the patient, family, or caregiver   I have reviewed the above documentation for accuracy and completeness, and I agree with the above. Sylvia JINNY [name], D.O.  The 21st Century Cures Act was signed into law  in 2016 which includes the topic of electronic health records.  This provides immediate access to information in MyChart.  This includes consultation notes, operative notes, office notes, lab results and pathology reports.  If you have any questions about what you read please let us  know at your next visit so we can discuss your concerns and take corrective action if need be.  We are right here with you.

## 2023-07-21 DIAGNOSIS — J301 Allergic rhinitis due to pollen: Secondary | ICD-10-CM | POA: Diagnosis not present

## 2023-07-21 DIAGNOSIS — J4540 Moderate persistent asthma, uncomplicated: Secondary | ICD-10-CM | POA: Diagnosis not present

## 2023-07-21 DIAGNOSIS — J3081 Allergic rhinitis due to animal (cat) (dog) hair and dander: Secondary | ICD-10-CM | POA: Diagnosis not present

## 2023-07-21 DIAGNOSIS — J3089 Other allergic rhinitis: Secondary | ICD-10-CM | POA: Diagnosis not present

## 2023-07-22 DIAGNOSIS — F4320 Adjustment disorder, unspecified: Secondary | ICD-10-CM | POA: Diagnosis not present

## 2023-07-28 DIAGNOSIS — G4733 Obstructive sleep apnea (adult) (pediatric): Secondary | ICD-10-CM | POA: Diagnosis not present

## 2023-07-29 DIAGNOSIS — F4320 Adjustment disorder, unspecified: Secondary | ICD-10-CM | POA: Diagnosis not present

## 2023-08-05 ENCOUNTER — Ambulatory Visit (INDEPENDENT_AMBULATORY_CARE_PROVIDER_SITE_OTHER): Payer: BC Managed Care – PPO | Admitting: Physician Assistant

## 2023-08-05 DIAGNOSIS — F4320 Adjustment disorder, unspecified: Secondary | ICD-10-CM | POA: Diagnosis not present

## 2023-08-12 DIAGNOSIS — F4320 Adjustment disorder, unspecified: Secondary | ICD-10-CM | POA: Diagnosis not present

## 2023-08-14 ENCOUNTER — Other Ambulatory Visit: Payer: Self-pay | Admitting: Obstetrics and Gynecology

## 2023-08-14 DIAGNOSIS — Z1231 Encounter for screening mammogram for malignant neoplasm of breast: Secondary | ICD-10-CM

## 2023-08-14 DIAGNOSIS — J3081 Allergic rhinitis due to animal (cat) (dog) hair and dander: Secondary | ICD-10-CM | POA: Diagnosis not present

## 2023-08-14 DIAGNOSIS — J3089 Other allergic rhinitis: Secondary | ICD-10-CM | POA: Diagnosis not present

## 2023-08-14 DIAGNOSIS — J301 Allergic rhinitis due to pollen: Secondary | ICD-10-CM | POA: Diagnosis not present

## 2023-08-17 ENCOUNTER — Encounter (INDEPENDENT_AMBULATORY_CARE_PROVIDER_SITE_OTHER): Payer: Self-pay | Admitting: Physician Assistant

## 2023-08-17 ENCOUNTER — Ambulatory Visit (INDEPENDENT_AMBULATORY_CARE_PROVIDER_SITE_OTHER): Payer: BC Managed Care – PPO | Admitting: Physician Assistant

## 2023-08-17 VITALS — BP 145/79 | HR 81 | Temp 98.9°F | Ht 64.0 in | Wt 235.0 lb

## 2023-08-17 DIAGNOSIS — E119 Type 2 diabetes mellitus without complications: Secondary | ICD-10-CM

## 2023-08-17 DIAGNOSIS — E669 Obesity, unspecified: Secondary | ICD-10-CM

## 2023-08-17 DIAGNOSIS — I152 Hypertension secondary to endocrine disorders: Secondary | ICD-10-CM | POA: Diagnosis not present

## 2023-08-17 DIAGNOSIS — E559 Vitamin D deficiency, unspecified: Secondary | ICD-10-CM

## 2023-08-17 DIAGNOSIS — Z6841 Body Mass Index (BMI) 40.0 and over, adult: Secondary | ICD-10-CM

## 2023-08-17 DIAGNOSIS — Z7984 Long term (current) use of oral hypoglycemic drugs: Secondary | ICD-10-CM

## 2023-08-17 DIAGNOSIS — E1169 Type 2 diabetes mellitus with other specified complication: Secondary | ICD-10-CM | POA: Diagnosis not present

## 2023-08-17 DIAGNOSIS — E1159 Type 2 diabetes mellitus with other circulatory complications: Secondary | ICD-10-CM

## 2023-08-17 DIAGNOSIS — E785 Hyperlipidemia, unspecified: Secondary | ICD-10-CM

## 2023-08-17 MED ORDER — VITAMIN D (ERGOCALCIFEROL) 1.25 MG (50000 UNIT) PO CAPS: 50000.0000 [IU] | ORAL_CAPSULE | ORAL | 0 refills | Status: DC

## 2023-08-17 NOTE — Progress Notes (Signed)
 SUBJECTIVE: Discussed the use of AI scribe software for clinical note transcription with the patient, who gave verbal consent to proceed.  Chief Complaint: Obesity  Interim History: She is down 4 lbs since her last visit.  She is down 19 lbs overall TBW loss of 7.5% !   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   Sylvia [name] is a 53 year old female with obesity who presents for follow-up of her obesity treatment plan.  She has not been fully committed to her weight management plan due to life events, including preparing for  her certification exam. Despite this, she has lost 19 pounds overall and is trying to be conscious of her dietary choices, focusing on protein intake and avoiding high-fat foods. She mentions making homemade pizzas with more vegetables and less cheese for herself.  She has a history of type 2 diabetes and is currently taking metformin 500 mg three times daily. Her last hemoglobin A1c in September was 7.0, which is above her target goal. No issues with metformin, and she takes it with meals to avoid stomach upset.  She also has hypertension and is on Norvasc  for blood pressure management. Her blood pressure was slightly elevated today, possibly due to rushing to the appointment.  She is managing hyperlipidemia and vitamin D  deficiency, for which she is taking vitamin D  supplements and needs a refill. She is also focusing on increasing her water  intake, starting her day with lemon water , and alternating between coffee and green tea.  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 She is trying to manage stress and maintain a healthy lifestyle, including exploring options for strength training and using hand weights for stress relief.  Sylvia [name] is here to discuss her progress with her obesity treatment plan. She is on the Category 1 Plan and states she is following  her eating plan approximately 50 % of the time. She states she is exercising walking 15-30 minutes 2 times per week.   OBJECTIVE: Visit Diagnoses: Problem List Items Addressed This Visit     New onset type 2 diabetes mellitus (HCC) - Primary   Hypertension associated with diabetes (HCC)   Vitamin D  deficiency - new onset   Relevant Medications   Vitamin D , Ergocalciferol , (DRISDOL ) 1.25 MG (50000 UNIT) CAPS capsule   Obesity (HCC)- Start BMI 43.6 Date 03/24/2023   Other Visit Diagnoses       Hyperlipidemia associated with type 2 diabetes mellitus (HCC) - new onset         Obesity 53 year old female on a weight loss program, has lost 19 pounds overall and 3.2 pounds since the last visit. She is maintaining muscle mass, increasing protein and water  intake, and avoiding high-fat foods. Exploring strength training options, including using a [employer] and consulting a trainer. Discussed the importance of consistent dietary habits and strength training for long-term weight management. - Continue current dietary plan focusing on protein and water  intake - Explore strength training options with [employer] and potential trainer consultation - Schedule follow-up visit in three weeks  Type 2 Diabetes Mellitus On metformin 500 mg three times daily. Last hemoglobin A1c in September was 7.0, above the goal. No reported issues with metformin. Discussed the importance of maintaining blood glucose levels within target range to prevent complications. Lab Results  Component Value Date   HGBA1C 7.0 (H) 03/24/2023   HGBA1C 5.2 02/09/2012   Lab Results  Component Value Date   LDLCALC 111 (H) 03/24/2023   CREATININE 0.64 03/24/2023    - Continue metformin 500 mg three times daily - Order fasting labs at visit on September 30, 2023 She is working  on nutrition plan to decrease simple carbohydrates, increase lean proteins and exercise to promote weight loss and improve glycemic control .  Hypertension On  Norvasc  10 mg daily for blood pressure management. Blood pressure slightly elevated today, likely due to rushing to the appointment. Discussed the importance of regular monitoring and stress management. - Continue Norvasc  - Monitor blood pressure regularly Continue to work on nutrition plan to promote weight loss and improve BP control.    Hyperlipidemia Making dietary changes that may positively impact lipid levels. Discussed the importance of regular lipid monitoring. Last lipids Lab Results  Component Value Date   CHOL 198 03/24/2023   HDL 62 03/24/2023   LDLCALC 111 (H) 03/24/2023   TRIG 141 03/24/2023    - Continue current dietary plan - Monitor lipid levels during next fasting labs Continue to work on nutrition plan -decreasing simple carbohydrates, increasing lean proteins, decreasing saturated fats and cholesterol , avoiding trans fats and exercise as able to promote weight loss, improve lipids and decrease cardiovascular risks.  Vitamin D  Deficiency On vitamin D  supplementation Ergocalciferol  50,000 units weekly. No N/V or muscle weakness with Ergo.  Last vitamin D  Lab Results  Component Value Date   VD25OH 25.3 (L) 03/24/2023   - Refill Ergocalciferol  50,000 units weekly.  Low vitamin D  levels can be associated with adiposity and may result in leptin resistance and weight gain. Also associated with fatigue.  Currently on vitamin D  supplementation without any adverse effects such as nausea, vomiting or muscle weakness.  Recheck vitamin d  level at visit in March.   General Health Maintenance Making lifestyle modifications including dietary changes and exploring strength training. Planning to take walks with her husband as the weather improves. Discussed the importance of maintaining these lifestyle changes for long-term health benefits. - Encourage continued lifestyle modifications  Follow-up - Schedule follow-up visit on September 10, 2023, at 3:30 PM - Schedule follow-up  visit on September 30, 2023, at 7:40 AM with Dr. Matthias Sor - Order fasting labs for September 30, 2023.  Vitals Temp: 98.9 F (37.2 C) BP: (!) 145/79 Pulse Rate: 81 SpO2: 100 %   Anthropometric Measurements Height: 5\' 4"  (1.626 m) Weight: 235 lb (106.6 kg) BMI (Calculated): 40.32 Weight at Last Visit: 239 lb Weight Lost Since Last Visit: 4 lb Weight Gained Since Last Visit: 0 Starting Weight: 254 lb Total Weight Loss (lbs): 19 lb (8.618 kg) Peak Weight: 279 lb   Body Composition  Body Fat %: 48.5 % Fat Mass (lbs): 114.2 lbs Muscle Mass (lbs): 115 lbs Total Body Water  (lbs): 85.4 lbs Visceral Fat Rating : 15   Other Clinical Data Fasting: No Labs: No Today's Visit #: 8 Starting Date: 03/24/23     ASSESSMENT AND PLAN:  Diet: Mikhayla is currently in the action stage of change. As such, her goal is to continue with weight loss efforts. She has agreed to Category 1 Plan.  Exercise: Kynnadi has been instructed to work up to a goal of 150 minutes of combined cardio and strengthening exercise per week for weight loss and overall health benefits.   Behavior Modification:  We discussed the following Behavioral Modification Strategies today: increasing lean protein intake, decreasing simple carbohydrates, increasing vegetables, increase H2O intake, increase high fiber foods, meal planning and cooking  strategies, better snacking choices, avoiding temptations, and planning for success. We discussed various medication options to help Chanita with her weight loss efforts and we both agreed to continue metformin for Type 2 diabetes and continue to work on nutritional and behavioral strategies to promote weight loss.  .  Return in about 3 weeks (around 09/07/2023).Aaron Aas She was informed of the importance of frequent follow up visits to maximize her success with intensive lifestyle modifications for her multiple health conditions.  Attestation Statements:   Reviewed by clinician on day of visit:  allergies, medications, problem list, medical history, surgical history, family history, social history, and previous encounter notes.   Time spent on visit including pre-visit chart review and post-visit care and charting was 33 minutes.    Maximiliano Cromartie, PA-C

## 2023-08-19 DIAGNOSIS — F4320 Adjustment disorder, unspecified: Secondary | ICD-10-CM | POA: Diagnosis not present

## 2023-08-21 DIAGNOSIS — J3081 Allergic rhinitis due to animal (cat) (dog) hair and dander: Secondary | ICD-10-CM | POA: Diagnosis not present

## 2023-08-21 DIAGNOSIS — J3089 Other allergic rhinitis: Secondary | ICD-10-CM | POA: Diagnosis not present

## 2023-08-21 DIAGNOSIS — J301 Allergic rhinitis due to pollen: Secondary | ICD-10-CM | POA: Diagnosis not present

## 2023-08-26 DIAGNOSIS — F4320 Adjustment disorder, unspecified: Secondary | ICD-10-CM | POA: Diagnosis not present

## 2023-09-02 DIAGNOSIS — F4320 Adjustment disorder, unspecified: Secondary | ICD-10-CM | POA: Diagnosis not present

## 2023-09-03 DIAGNOSIS — J3089 Other allergic rhinitis: Secondary | ICD-10-CM | POA: Diagnosis not present

## 2023-09-03 DIAGNOSIS — J301 Allergic rhinitis due to pollen: Secondary | ICD-10-CM | POA: Diagnosis not present

## 2023-09-03 DIAGNOSIS — J3081 Allergic rhinitis due to animal (cat) (dog) hair and dander: Secondary | ICD-10-CM | POA: Diagnosis not present

## 2023-09-10 ENCOUNTER — Ambulatory Visit (INDEPENDENT_AMBULATORY_CARE_PROVIDER_SITE_OTHER): Payer: BC Managed Care – PPO | Admitting: Physician Assistant

## 2023-09-10 ENCOUNTER — Encounter (INDEPENDENT_AMBULATORY_CARE_PROVIDER_SITE_OTHER): Payer: Self-pay | Admitting: Physician Assistant

## 2023-09-10 VITALS — BP 129/75 | HR 96 | Temp 98.0°F | Ht 64.0 in | Wt 230.0 lb

## 2023-09-10 DIAGNOSIS — I152 Hypertension secondary to endocrine disorders: Secondary | ICD-10-CM | POA: Diagnosis not present

## 2023-09-10 DIAGNOSIS — Z6839 Body mass index (BMI) 39.0-39.9, adult: Secondary | ICD-10-CM

## 2023-09-10 DIAGNOSIS — E785 Hyperlipidemia, unspecified: Secondary | ICD-10-CM

## 2023-09-10 DIAGNOSIS — E1169 Type 2 diabetes mellitus with other specified complication: Secondary | ICD-10-CM

## 2023-09-10 DIAGNOSIS — E119 Type 2 diabetes mellitus without complications: Secondary | ICD-10-CM

## 2023-09-10 DIAGNOSIS — E1159 Type 2 diabetes mellitus with other circulatory complications: Secondary | ICD-10-CM

## 2023-09-10 DIAGNOSIS — Z7984 Long term (current) use of oral hypoglycemic drugs: Secondary | ICD-10-CM

## 2023-09-10 DIAGNOSIS — E669 Obesity, unspecified: Secondary | ICD-10-CM

## 2023-09-10 DIAGNOSIS — E559 Vitamin D deficiency, unspecified: Secondary | ICD-10-CM

## 2023-09-10 MED ORDER — VITAMIN D (ERGOCALCIFEROL) 1.25 MG (50000 UNIT) PO CAPS: 50000.0000 [IU] | ORAL_CAPSULE | ORAL | 0 refills | Status: DC

## 2023-09-10 NOTE — Progress Notes (Signed)
 SUBJECTIVE: Discussed the use of AI scribe software for clinical note transcription with the patient, who gave verbal consent to proceed.  Chief Complaint: Obesity  Interim History: She is down 5 lbs since her last visit.  Down 24 lbs since 03/24/23 TBW loss of 9.45%  Down 49 lbs from peak weight TBW loss of 17.6% from peak weight  Sylvia [name] is here to discuss her progress with her obesity treatment plan. She is on the Category 1 Plan and states she is following her eating plan approximately 75-80 % of the time. She states she is exercising elliptical Sylvia [name] 5/15-25 minutes 3/4 times per week. Sylvia [name] is a 53 year old female with obesity who presents for follow-up of her obesity treatment plan.  She has lost a total of 24 pounds since September 2024, with an additional 5 pounds lost recently. She adheres to a category one nutrition plan 75-80% of the time and engages in regular physical activity, including using an elliptical and walking in her neighborhood. Muscle mass has slightly decreased from 115 pounds to 114.6 pounds since her last visit.  She has a history of type 2 diabetes and is currently taking metformin 500 mg twice daily. No recent lab work has been conducted, but it is planned for the next visit.  Her blood pressure was initially elevated but normalized upon recheck.  She is on ergocalciferol 50,000 units once a week for vitamin D deficiency.  She has a history of hyperlipidemia, though specific treatments for this condition were not discussed in detail.  She has obstructive sleep apnea, though specific treatments or symptoms related to this condition were not discussed.  She reports a supportive family environment, including a husband, daughter, siblings, nieces, nephews, and a Scientist, forensic. She also has a strong support network through her church and coworkers. She notes work-related stress due to changes in supervision and job demands, which she manages through  exercise, therapy, mindfulness, and family support.   Fasting labs next OV.    OBJECTIVE: Visit Diagnoses: Problem List Items Addressed This Visit     New onset type 2 diabetes mellitus (HCC) - Primary   Hypertension associated with diabetes (HCC)   Vitamin D deficiency - new onset   Relevant Medications   Vitamin D, Ergocalciferol, (DRISDOL) 1.25 MG (50000 UNIT) CAPS capsule   Obesity (HCC)- Start BMI 43.6 Date 03/24/2023   Other Visit Diagnoses       Hyperlipidemia associated with type 2 diabetes mellitus (HCC) - new onset         OSA- will be started on CPAP soon         BMI 39.0-39.9,adult Current BMI 39.5         Obesity 53 year old female on a weight loss plan, lost 24 pounds since September 2024. Adheres to a category one nutrition plan 75-80% of the time and exercises regularly. Muscle mass slightly decreased from 115 pounds to 114.6 pounds. Visceral adipose rating improved to 14, goal is 12 or less. Discussed maintaining muscle mass and benefits of regular exercise for stress reduction and overall health. - Continue current nutrition plan - Continue regular exercise regimen - Monitor weight and muscle mass  Type 2 Diabetes Mellitus Managed with metformin 500 mg twice daily. No Gi or other side effects.  Lab Results  Component Value Date   HGBA1C 7.0 (H) 03/24/2023   HGBA1C 5.2 02/09/2012   Lab Results  Component Value Date   LDLCALC 111 (H) 03/24/2023   CREATININE 0.64 03/24/2023  INSULIN  Date Value Ref Range Status  03/24/2023 12.0 2.6 - 24.9 uIU/mL Final  She is working  on nutrition plan to decrease simple carbohydrates, increase lean proteins and exercise to promote weight loss and improve glycemic control . - Continue metformin 500 mg twice daily - Perform labs at next visit with Sylvia [name]  Hypertension Initial elevated blood pressure normalized on recheck. Emphasized regular blood pressure monitoring and stress management. BP Readings from Last 3  Encounters:  09/10/23 129/75  08/17/23 (!) 145/79  07/13/23 (!) 154/80  On amlodipine 10 mg daily. No side effects.  Plan: Continue Amlodipine Continue to work on nutrition plan to promote weight loss and improve BP control.    Vitamin D Deficiency Managed with ergocalciferol 50,000 units once a week. Emphasized adherence to supplementation. No N/V or muscle weakness with Ergo. Last vitamin D Lab Results  Component Value Date   VD25OH 25.3 (L) 03/24/2023    - Continue ergocalciferol 50,000 units once a week Low vitamin D levels can be associated with adiposity and may result in leptin resistance and weight gain. Also associated with fatigue.  Currently on vitamin D supplementation without any adverse effects such as nausea, vomiting or muscle weakness.  Recheck vitamin D level next visit.   General Health Maintenance Engages in mindfulness, therapy, journaling, prayer, and art therapy. Has a supportive family and church community. Discussed benefits for overall well-being and stress reduction. - Continue mindfulness practices - Continue weekly therapy sessions - Continue journaling and art therapy - Maintain supportive family and church relationships  Follow-up - Follow-up visit on September 30, 2023, at 7:40 AM - Fast for at least 8 hours before the visit - Stay well-hydrated for labs.  Vitals Temp: 98 F (36.7 C) BP: 129/75 Pulse Rate: 96 SpO2: 100 %   Anthropometric Measurements Height: 5\' 4"  (1.626 m) Weight: 230 lb (104.3 kg) BMI (Calculated): 39.46 Weight at Last Visit: 235 lb Weight Lost Since Last Visit: 5 lb Weight Gained Since Last Visit: 0 Starting Weight: 254 lb Total Weight Loss (lbs): 24 lb (10.9 kg) Peak Weight: 279 lb   Body Composition  Body Fat %: 47.6 % Fat Mass (lbs): 109.4 lbs Muscle Mass (lbs): 114.6 lbs Total Body Water (lbs): 85.4 lbs Visceral Fat Rating : 14   Other Clinical Data Fasting: no Labs: no Today's Visit #: 9 Starting  Date: 03/24/23     ASSESSMENT AND PLAN:  Diet: Sylvia [name] is currently in the action stage of change. As such, her goal is to continue with weight loss efforts and has agreed to the Category 1 Plan.   Exercise:  For substantial health benefits, adults should do at least 150 minutes (2 hours and 30 minutes) a week of moderate-intensity, or 75 minutes (1 hour and 15 minutes) a week of vigorous-intensity aerobic physical activity, or an equivalent combination of moderate- and vigorous-intensity aerobic activity. Aerobic activity should be performed in episodes of at least 10 minutes, and preferably, it should be spread throughout the week.  Behavior Modification:  We discussed the following Behavioral Modification Strategies today: increasing lean protein intake, decreasing simple carbohydrates, increasing vegetables, increase H2O intake, increase high fiber foods, emotional eating strategies , avoiding temptations, and planning for success. We discussed various medication options to help Sylvia [name] with her weight loss efforts and we both agreed to continue metformin for primary indication of Type 2 diabetes.  Return in about 4 weeks (around 10/08/2023) for Fasting Lab.Marland Kitchen She was informed of the importance of frequent  follow up visits to maximize her success with intensive lifestyle modifications for her multiple health conditions.  Attestation Statements:   Reviewed by clinician on day of visit: allergies, medications, problem list, medical history, surgical history, family history, social history, and previous encounter notes.   Time spent on visit including pre-visit chart review and post-visit care and charting was 36 minutes  Akari Crysler,PA-C

## 2023-09-16 DIAGNOSIS — F4320 Adjustment disorder, unspecified: Secondary | ICD-10-CM | POA: Diagnosis not present

## 2023-09-17 DIAGNOSIS — J3089 Other allergic rhinitis: Secondary | ICD-10-CM | POA: Diagnosis not present

## 2023-09-17 DIAGNOSIS — J3081 Allergic rhinitis due to animal (cat) (dog) hair and dander: Secondary | ICD-10-CM | POA: Diagnosis not present

## 2023-09-17 DIAGNOSIS — J301 Allergic rhinitis due to pollen: Secondary | ICD-10-CM | POA: Diagnosis not present

## 2023-09-21 DIAGNOSIS — Z23 Encounter for immunization: Secondary | ICD-10-CM | POA: Diagnosis not present

## 2023-09-21 DIAGNOSIS — I10 Essential (primary) hypertension: Secondary | ICD-10-CM | POA: Diagnosis not present

## 2023-09-21 DIAGNOSIS — G4733 Obstructive sleep apnea (adult) (pediatric): Secondary | ICD-10-CM | POA: Diagnosis not present

## 2023-09-21 DIAGNOSIS — R7303 Prediabetes: Secondary | ICD-10-CM | POA: Diagnosis not present

## 2023-09-21 DIAGNOSIS — J4550 Severe persistent asthma, uncomplicated: Secondary | ICD-10-CM | POA: Diagnosis not present

## 2023-09-21 DIAGNOSIS — Z0000 Encounter for general adult medical examination without abnormal findings: Secondary | ICD-10-CM | POA: Diagnosis not present

## 2023-09-23 DIAGNOSIS — F4320 Adjustment disorder, unspecified: Secondary | ICD-10-CM | POA: Diagnosis not present

## 2023-09-30 ENCOUNTER — Encounter (INDEPENDENT_AMBULATORY_CARE_PROVIDER_SITE_OTHER): Payer: Self-pay | Admitting: Family Medicine

## 2023-09-30 ENCOUNTER — Ambulatory Visit (INDEPENDENT_AMBULATORY_CARE_PROVIDER_SITE_OTHER): Payer: BC Managed Care – PPO | Admitting: Family Medicine

## 2023-09-30 VITALS — BP 134/78 | HR 84 | Temp 97.8°F | Ht 64.0 in | Wt 230.0 lb

## 2023-09-30 DIAGNOSIS — I152 Hypertension secondary to endocrine disorders: Secondary | ICD-10-CM | POA: Diagnosis not present

## 2023-09-30 DIAGNOSIS — E1169 Type 2 diabetes mellitus with other specified complication: Secondary | ICD-10-CM

## 2023-09-30 DIAGNOSIS — Z6839 Body mass index (BMI) 39.0-39.9, adult: Secondary | ICD-10-CM

## 2023-09-30 DIAGNOSIS — Z7984 Long term (current) use of oral hypoglycemic drugs: Secondary | ICD-10-CM

## 2023-09-30 DIAGNOSIS — E1159 Type 2 diabetes mellitus with other circulatory complications: Secondary | ICD-10-CM | POA: Diagnosis not present

## 2023-09-30 DIAGNOSIS — F4320 Adjustment disorder, unspecified: Secondary | ICD-10-CM | POA: Diagnosis not present

## 2023-09-30 DIAGNOSIS — E669 Obesity, unspecified: Secondary | ICD-10-CM

## 2023-09-30 DIAGNOSIS — E785 Hyperlipidemia, unspecified: Secondary | ICD-10-CM | POA: Diagnosis not present

## 2023-09-30 DIAGNOSIS — J3089 Other allergic rhinitis: Secondary | ICD-10-CM | POA: Diagnosis not present

## 2023-09-30 DIAGNOSIS — E559 Vitamin D deficiency, unspecified: Secondary | ICD-10-CM | POA: Diagnosis not present

## 2023-09-30 DIAGNOSIS — E119 Type 2 diabetes mellitus without complications: Secondary | ICD-10-CM

## 2023-09-30 DIAGNOSIS — F5089 Other specified eating disorder: Secondary | ICD-10-CM

## 2023-09-30 DIAGNOSIS — G4733 Obstructive sleep apnea (adult) (pediatric): Secondary | ICD-10-CM | POA: Diagnosis not present

## 2023-09-30 DIAGNOSIS — J301 Allergic rhinitis due to pollen: Secondary | ICD-10-CM | POA: Diagnosis not present

## 2023-09-30 DIAGNOSIS — F39 Unspecified mood [affective] disorder: Secondary | ICD-10-CM

## 2023-09-30 DIAGNOSIS — J3081 Allergic rhinitis due to animal (cat) (dog) hair and dander: Secondary | ICD-10-CM | POA: Diagnosis not present

## 2023-09-30 MED ORDER — VITAMIN D (ERGOCALCIFEROL) 1.25 MG (50000 UNIT) PO CAPS
50000.0000 [IU] | ORAL_CAPSULE | ORAL | 0 refills | Status: DC
Start: 2023-09-30 — End: 2023-10-28

## 2023-09-30 NOTE — Progress Notes (Signed)
 Sylvia [name], D.O.  ABFM, ABOM Specializing in Clinical Bariatric Medicine  Office located at: [address] [place], [place]  [address]   Assessment and Plan:   Orders Placed This Encounter  Procedures   VITAMIN D 25 Hydroxy (Vit-D Deficiency, Fractures)   Lipid panel   Hemoglobin A1c   Comprehensive metabolic panel    Medications Discontinued During This Encounter  Medication Reason   Vitamin D, Ergocalciferol, (DRISDOL) 1.25 MG (50000 UNIT) CAPS capsule Reorder     Meds ordered this encounter  Medications   Vitamin D, Ergocalciferol, (DRISDOL) 1.25 MG (50000 UNIT) CAPS capsule    Sig: Take 1 capsule (50,000 Units total) by mouth every 7 (seven) days.    Dispense:  4 capsule    Refill:  0      FOR THE DISEASE OF OBESITY:  BMI 39.0-39.9,adult Current BMI 39.46 Obesity, Beginning BMI 43.6 Assessment & Plan: Since last office visit on 09/10/2023, patient's muscle mass has increased by 1 lb. Fat /mass has decreased by 0.6 lbs. Total body water has decreased by 1.2 lbs.  Counseling done on how various foods will affect these numbers and how to maximize success  Total lbs lost to date: 24 lbs Total weight loss percentage to date: 9.45%    Recommended Dietary Goals [name] is currently in the action stage of change. As such, her goal is to continue weight management plan.  She has agreed to: continue current plan   Behavioral Intervention We discussed the following today: work on managing stress, creating time for self-care and relaxation and celebration eating strategies  Additional resources provided today: None  Evidence-based interventions for health behavior change were utilized today including the discussion of self monitoring techniques, problem-solving barriers and SMART goal setting techniques.   Regarding patient's less desirable eating habits and patterns, we employed the technique of small changes.   Pt will specifically work on:  n/a   Recommended Physical Activity Goals [name] has been advised to work up to 150 minutes of moderate intensity aerobic activity a week and strengthening exercises 2-3 times per week for cardiovascular health, weight loss maintenance and preservation of muscle mass.   She has agreed to : Increase physical activity for stress management   Pharmacotherapy We both agreed to :  continue current medication regimen   FOR ASSOCIATED CONDITIONS ADDRESSED TODAY:  Type 2 diabetes mellitus (HCC) Assessment & Plan: Lab Results  Component Value Date   HGBA1C 7.0 (H) 03/24/2023  [name] was prescribed Metformin 500 mg three times daily, per PCP, pt reports only taking twice daily. Hunger/cravings well controlled, pt denies any GI upset. Pts last A1c was 7.0 6 months ago, reviewed how high A1c can also cause damage to kidneys and heart. Briefly discussed starting Rybelsus to aid in bringing down A1c. Pt prefers to hold off on medication until after obtaining new labs. She is open to considering in the future. Continue current medication regimen and adhering to prudent nutritional plan with high protein and low carbs. Will recheck A1c today.  Relevant Orders: -     Hemoglobin A1c   Hyperlipidemia associated with type 2 diabetes mellitus [facility]) Assessment & Plan: Lab Results  Component Value Date   CHOL 198 03/24/2023   HDL 62 03/24/2023   LDLCALC 111 (H) 03/24/2023   TRIG 141 03/24/2023  Pt is not currently taking any medications for this condition. Diet//exercise approach. Per last obtained labs, her LDL was elevated at 111. Continue low cholesterol, heart healthy  MP. Will obtain lipid panel today.   Relevant Orders: -     Lipid panel   Hypertension associated with diabetes Assessment & Plan: BP Readings from Last 3 Encounters:  09/30/23 134/78  09/10/23 129/75  08/17/23 (!) 145/79  [name] is taking Norvasc 10 mg daily, per PCP. She presented with BP at goal today. Continue following our  low salt, heart healthy meal plan and engaging in a regular exercise program. Continue with current blood pressure regimen as recommended by PCP.  Relevant Orders: -     Comprehensive metabolic panel with GFR   Vitamin D deficiency Assessment & Plan: [name] is taking ERGO 50000 units once weekly. Pt is tolerating supplement well. Continue current supplementation regimen. Will recheck levels today.   Relevant Orders: -     Vitamin D (Ergocalciferol); Take 1 capsule (50,000 Units total) by mouth every 7 (seven) days.  Dispense: 4 capsule; Refill: 0 -     VITAMIN D 25 Hydroxy (Vit-D Deficiency, Fractures)   Mood disorder (HCC) - emotional eating Assessment & Plan: [name] is not currently on any mood medications. She reports work has been stressful and she could potentially lose her position d/t recent events occurring in the work place. During these stressful times, Aayana admits to sometimes skipping meals or eating sweets like chocolate. [name] is "trying to move forward", she is exercising and incorporating intentional rest as a stress reliever. Additionally, pt has been trying to regulate her sleep cycle but some days it is more difficult to sleep. She endorses seeing her therapist frequently, she feels this is helpful. Encouraged pt to continue stress relieving strategies. Will monitor condition closely alongside specialist.   Follow up:   Return in about 4 weeks (around 10/28/2023). She was informed of the importance of frequent follow up visits to maximize her success with intensive lifestyle modifications for her multiple health conditions.  TAMERRA MERKLEY is aware that we will review all of her lab results at our next visit together in person.  She is aware that if anything is critical/ life threatening with the results, we will be contacting her via MyChart or by my CMA will be calling them prior to the office visit to discuss acute management.     Subjective:   Chief complaint:  Obesity [name] is here to discuss her progress with her obesity treatment plan. She is on the the Category 1 Plan and states she is following her eating plan approximately 75% of the time. She states she is walk or use elliptical 20 minutes 3 days per week.  Interval History:  DVORA BUITRON is here for a follow up office visit. Since last OV on 09/10/2023,  Nattalie's weight has not changed. She reports work has recently been stressful. However, she just celebrated her marriage anniversary and her child's birthday is next week.   Pharmacotherapy for weight loss: She is currently taking Metformin 500 mg three times daily.   Review of Systems:  Pertinent positives were addressed with patient today.  Reviewed by clinician on day of visit: allergies, medications, problem list, medical history, surgical history, family history, social history, and previous encounter notes.  Weight Summary and Biometrics   Weight Lost Since Last Visit: 0  Weight Gained Since Last Visit: 0   Vitals Temp: 97.8 F (36.6 C) BP: 134/78 Pulse Rate: 84 SpO2: 100 %   Anthropometric Measurements Height: 5\' 4"  (1.626 m) Weight: 230 lb (104.3 kg) BMI (Calculated): 39.46 Weight at Last Visit: 230lb Weight Lost  Since Last Visit: 0 Weight Gained Since Last Visit: 0 Starting Weight: 254lb Total Weight Loss (lbs): 24 lb (10.9 kg) Peak Weight: 279lb   Body Composition  Body Fat %: 47.2 % Fat Mass (lbs): 108.8 lbs Muscle Mass (lbs): 115.6 lbs Total Body Water (lbs): 84.2 lbs Visceral Fat Rating : 14   Other Clinical Data Fasting: yes Labs: yes Today's Visit #: 10 Starting Date: 03/24/23    Objective:   PHYSICAL EXAM: Blood pressure 134/78, pulse 84, temperature 97.8 F (36.6 C), height 5\' 4"  (1.626 m), weight 230 lb (104.3 kg), last menstrual period 05/24/2018, SpO2 100%. Body mass index is 39.48 kg/m.  General: she is overweight, cooperative and in no acute distress. PSYCH: Has normal mood,  affect and thought process.   HEENT: EOMI, sclerae are anicteric. Lungs: Normal breathing effort, no conversational dyspnea. Extremities: Moves * 4 Neurologic: A and O * 3, good insight  DIAGNOSTIC DATA REVIEWED: BMET    Component Value Date/Time   NA 142 03/24/2023 0910   K 4.0 03/24/2023 0910   CL 102 03/24/2023 0910   CO2 25 03/24/2023 0910   GLUCOSE 104 (H) 03/24/2023 0910   GLUCOSE 102 (H) 06/11/2018 1140   BUN 12 03/24/2023 0910   CREATININE 0.64 03/24/2023 0910   CALCIUM 9.4 03/24/2023 0910   GFRNONAA >60 06/11/2018 1140   GFRAA >60 06/11/2018 1140   Lab Results  Component Value Date   HGBA1C 7.0 (H) 03/24/2023   HGBA1C 5.2 02/09/2012   Lab Results  Component Value Date   INSULIN 12.0 03/24/2023   Lab Results  Component Value Date   TSH 1.730 03/24/2023   CBC    Component Value Date/Time   WBC 10.4 02/17/2023 0937   RBC 4.60 02/17/2023 0937   HGB 13.8 02/17/2023 0937   HCT 43.2 02/17/2023 0937   PLT 354 02/17/2023 0937   MCV 93.9 02/17/2023 0937   MCH 30.0 02/17/2023 0937   MCHC 31.9 02/17/2023 0937   RDW 13.5 02/17/2023 0937   Iron Studies No results found for: "IRON", "TIBC", "FERRITIN", "IRONPCTSAT" Lipid Panel     Component Value Date/Time   CHOL 198 03/24/2023 0910   TRIG 141 03/24/2023 0910   HDL 62 03/24/2023 0910   LDLCALC 111 (H) 03/24/2023 0910   Hepatic Function Panel     Component Value Date/Time   PROT 6.6 03/24/2023 0910   ALBUMIN 3.9 03/24/2023 0910   AST 15 03/24/2023 0910   ALT 22 03/24/2023 0910   ALKPHOS 106 03/24/2023 0910   BILITOT 0.3 03/24/2023 0910      Component Value Date/Time   TSH 1.730 03/24/2023 0910   Nutritional Lab Results  Component Value Date   VD25OH 25.3 (L) 03/24/2023    Attestations:   I, Camryn Mix, acting as a Stage manager for [employer], DO., have compiled all relevant documentation for today's office visit on behalf of Thomasene Lot, DO, while in the presence of [employer],  DO.  I have reviewed the above documentation for accuracy and completeness, and I agree with the above. Sylvia [name], D.O.  The 21st Century Cures Act was signed into law in 2016 which includes the topic of electronic health records.  This provides immediate access to information in MyChart.  This includes consultation notes, operative notes, office notes, lab results and pathology reports.  If you have any questions about what you read please let us know at your next visit so we can discuss your concerns and take corrective  action if need be.  We are right here with you.

## 2023-10-03 LAB — COMPREHENSIVE METABOLIC PANEL WITH GFR
ALT: 13 IU/L (ref 0–32)
AST: 15 IU/L (ref 0–40)
Albumin: 4.2 g/dL (ref 3.8–4.9)
Alkaline Phosphatase: 84 IU/L (ref 44–121)
BUN/Creatinine Ratio: 16 (ref 9–23)
BUN: 11 mg/dL (ref 6–24)
Bilirubin Total: 0.2 mg/dL (ref 0.0–1.2)
CO2: 22 mmol/L (ref 20–29)
Calcium: 9.5 mg/dL (ref 8.7–10.2)
Chloride: 101 mmol/L (ref 96–106)
Creatinine, Ser: 0.7 mg/dL (ref 0.57–1.00)
Globulin, Total: 2.7 g/dL (ref 1.5–4.5)
Glucose: 99 mg/dL (ref 70–99)
Potassium: 3.9 mmol/L (ref 3.5–5.2)
Sodium: 140 mmol/L (ref 134–144)
Total Protein: 6.9 g/dL (ref 6.0–8.5)
eGFR: 104 mL/min/{1.73_m2} (ref >59)

## 2023-10-03 LAB — LIPID PANEL
Chol/HDL Ratio: 3.2 ratio (ref 0.0–4.4)
Cholesterol, Total: 198 mg/dL (ref 100–199)
HDL: 61 mg/dL (ref >39)
LDL Chol Calc (NIH): 122 mg/dL — ABNORMAL HIGH (ref 0–99)
Triglycerides: 84 mg/dL (ref 0–149)
VLDL Cholesterol Cal: 15 mg/dL (ref 5–40)

## 2023-10-03 LAB — VITAMIN D 25 HYDROXY (VIT D DEFICIENCY, FRACTURES): Vit D, 25-Hydroxy: 49.7 ng/mL (ref 30.0–100.0)

## 2023-10-03 LAB — HEMOGLOBIN A1C
Est. average glucose Bld gHb Est-mCnc: 108 mg/dL
Hgb A1c MFr Bld: 5.4 % (ref 4.8–5.6)

## 2023-10-06 ENCOUNTER — Encounter (INDEPENDENT_AMBULATORY_CARE_PROVIDER_SITE_OTHER): Payer: Self-pay | Admitting: Family Medicine

## 2023-10-07 DIAGNOSIS — F4320 Adjustment disorder, unspecified: Secondary | ICD-10-CM | POA: Diagnosis not present

## 2023-10-07 DIAGNOSIS — F320 Major depressive disorder, single episode, mild: Secondary | ICD-10-CM | POA: Diagnosis not present

## 2023-10-08 DIAGNOSIS — J301 Allergic rhinitis due to pollen: Secondary | ICD-10-CM | POA: Diagnosis not present

## 2023-10-08 DIAGNOSIS — J3089 Other allergic rhinitis: Secondary | ICD-10-CM | POA: Diagnosis not present

## 2023-10-08 DIAGNOSIS — J3081 Allergic rhinitis due to animal (cat) (dog) hair and dander: Secondary | ICD-10-CM | POA: Diagnosis not present

## 2023-10-09 ENCOUNTER — Telehealth: Payer: Self-pay

## 2023-10-09 ENCOUNTER — Telehealth: Payer: Self-pay | Admitting: Pharmacist

## 2023-10-09 ENCOUNTER — Ambulatory Visit: Payer: BC Managed Care – PPO | Admitting: Pulmonary Disease

## 2023-10-09 ENCOUNTER — Encounter: Payer: Self-pay | Admitting: Pulmonary Disease

## 2023-10-09 VITALS — BP 124/70 | HR 99 | Temp 96.9°F | Ht 64.0 in | Wt 238.6 lb

## 2023-10-09 DIAGNOSIS — J4550 Severe persistent asthma, uncomplicated: Secondary | ICD-10-CM

## 2023-10-09 DIAGNOSIS — G4733 Obstructive sleep apnea (adult) (pediatric): Secondary | ICD-10-CM | POA: Diagnosis not present

## 2023-10-09 DIAGNOSIS — J309 Allergic rhinitis, unspecified: Secondary | ICD-10-CM

## 2023-10-09 LAB — NITRIC OXIDE: Nitric Oxide: 14

## 2023-10-09 MED ORDER — DUPIXENT 300 MG/2ML ~~LOC~~ SOAJ: 300.0000 mg | SUBCUTANEOUS | 1 refills | Status: AC

## 2023-10-09 NOTE — Telephone Encounter (Signed)
 Refill for Dupixent sent to [facility]  Chesley Mires, PharmD, MPH, BCPS, CPP Clinical Pharmacist (Rheumatology and Pulmonology)

## 2023-10-09 NOTE — Telephone Encounter (Signed)
 Patient was seen in the office today. She said she needs to know how to get a refill on her Dupixent.  Please advise.

## 2023-10-09 NOTE — Progress Notes (Signed)
 Synopsis: Referred in by Sigmund Hazel, MD   Subjective:   PATIENT ID: Sylvia [name] GENDER: female DOB: 06-29-1971, MRN: [id number]  No chief complaint on file.   HPI [name] is a pleasant 53 year old female patient with a past medical history of severe persistent asthma on Trelegy presenting to the pulmonary office for worsening asthma control.  She reports that she was diagnosed with asthma at a young age and was hospitalized multiple times in the past but never required intubation.  Last being 6 to 7 years ago.  About a month prior to presentation she started having shortness of breath chest tightness on a daily basis and had to use her rescue inhaler multiple times a day that was associated with coughing spells at night with wheezing.  She went to the urgent care clinic in July and was prescribed prednisone.  It did help however she was never back to baseline.  She went to her allergist Dr. Cleves Callas who prescribed another round of prednisone with same response.  She denies any heartburn denies any acid taste in the mouth.  She does snore at night per her husband and feels fatigued during the day.  She denies any weight loss loss of appetite night sweats. She has a history of ectopic dermatitis and allergic rhinitis.    In the interim, she underwent PFTs that showed mild restriction with normal DLCO. Her Eosinophil count was 200 and IgE total count was a 1000. Antigen panel was + for multiple antigens highest for Dog Dander. She also underwent a sleep study on 09/18 with moderate sleep apnea AHI 15. Currently on Trelegy 200, Airsupra as needed and dupixent.    Started on dupixent 04/2023 and CPAP 04/2023.   Today reports feeling better. using rescue inhaler very rarely once a week to once every 2 weeks. Continues to exercise regularly and lose weight.   Family history: Daughter with asthma   Social history: Never smoker, denies alcohol use or illicit drug use. Worked as Dance movement psychotherapist. Does not have any pets at home.   ROS All systems were reviewed and are negative except for the above.  Objective:   There were no vitals filed for this visit.    on RA BMI Readings from Last 3 Encounters:  09/30/23 39.48 kg/m  09/10/23 39.48 kg/m  08/17/23 40.34 kg/m   Wt Readings from Last 3 Encounters:  09/30/23 230 lb (104.3 kg)  09/10/23 230 lb (104.3 kg)  08/17/23 235 lb (106.6 kg)    Physical Exam GEN: NAD, Healthy Appearing HEENT: Supple Neck, Reactive Pupils, possible nasal polyp seen. CVS: Normal S1, Normal S2, RRR, No murmurs or ES appreciated  Lungs: Faint expiratory wheezing.  Abdomen: Soft, non tender, non distended, + BS  Extremities: Warm and well perfused, No edema  Skin: No suspicious lesions appreciated  Psych: Normal Affect  Ancillary Information   CBC    Component Value Date/Time   WBC 10.4 02/17/2023 0937   RBC 4.60 02/17/2023 0937   HGB 13.8 02/17/2023 0937   HCT 43.2 02/17/2023 0937   PLT 354 02/17/2023 0937   MCV 93.9 02/17/2023 0937   MCH 30.0 02/17/2023 0937   MCHC 31.9 02/17/2023 0937   RDW 13.5 02/17/2023 0937   LYMPHSABS 2.4 02/17/2023 0937   MONOABS 0.9 02/17/2023 0937   EOSABS 0.2 02/17/2023 0937   BASOSABS 0.1 02/17/2023 0937    Imaging  CXR 01/15/2023: No active cardiopulmonary disease  CTA Chest 01/22/2023: No acute intrathoracic findings.  Echocardiogram 03/06/23  1. Left ventricular ejection fraction, by estimation, is 60 to 65%. The  left ventricle has normal function. The left ventricle has no regional  wall motion abnormalities. Left ventricular diastolic parameters were  normal.   2. Right ventricular systolic function is normal. The right ventricular  size is normal. There is normal pulmonary artery systolic pressure. The  estimated right ventricular systolic pressure is 19.1 mmHg.   3. The mitral valve is normal in structure. Mild mitral valve  regurgitation. No evidence of mitral stenosis.    4. The aortic valve is normal in structure. Aortic valve regurgitation is  not visualized. Aortic valve sclerosis is present, with no evidence of  aortic valve stenosis.   5. The inferior vena cava is normal in size with greater than 50%  respiratory variability, suggesting right atrial pressure of 3 mmHg.     Latest Ref Rng & Units 03/12/2023    8:13 AM  PFT Results  FVC-Pre L 2.50   FVC-Predicted Pre % 71   FVC-Post L 2.32   FVC-Predicted Post % 65   Pre FEV1/FVC % % 84   Post FEV1/FCV % % 87   FEV1-Pre L 2.09   FEV1-Predicted Pre % 75   FEV1-Post L 2.01   DLCO uncorrected ml/min/mmHg 17.37   DLCO UNC% % 82   DLVA Predicted % 118   TLC L 4.01   TLC % Predicted % 79   RV % Predicted % 90      Assessment & Plan:  Ms. [name] is a pleasant 53 year old female patient with a past medical history of severe persistent asthma on Trelegy presenting to the pulmonary office for worsening asthma control.  #Severe Persistent Asthma well controlled ACT 23 IgE 1000.  Eos 200 FENO 14 indicating well controlled asthma with improved airway inflammation.  []  continue with Fluticasone-Umeclidinium-Vilanterol [Trelegy] 200-62.5-25mcg 1 puff daily.  []  c/w with Albuterol-budesonide [airsupra] 90-28mcg 2puffs Q6H PRN for wheezing/sob/chest tightness  []  c w/ Dupixent Therapy (Started 04/2023) []  c/w good house care  inlcuding airpurifiers and dehumidifier.  []  C/w weightloss and healthy lifestyle.   #Allergic rhinitis with possible polyps  []  c/w Flonase ns 1 spray in each nares for 1 month  []  Avoid NSAIDs  []  Dupixent as above.   #Moderate OSA (AHI 14) Per husband she does snore at night and has some apneic episodes. She is high risk for OSA.   []  Conitnue with CPAP 8.    RTC 6 months.  I spent 30 minutes caring for this patient today, including preparing to see the patient, obtaining a medical history , reviewing a separately obtained history, performing a medically  appropriate examination and/or evaluation, counseling and educating the patient/family/caregiver, ordering medications, tests, or procedures, documenting clinical information in the electronic health record, and independently interpreting results (not separately reported/billed) and communicating results to the patient/family/caregiver  Janann Colonel, MD [facility] 10/09/2023 8:36 AM

## 2023-10-09 NOTE — Telephone Encounter (Signed)
 Submitted an URGENT Prior Authorization RENEWAL request to [employer] for DUPIXENT via CoverMyMeds. Will update once we receive a response.  Key: [id number]

## 2023-10-09 NOTE — Telephone Encounter (Signed)
 Received notification from [employer] regarding a prior authorization for DUPIXENT. Authorization has been APPROVED from 09/09/2023 to 10/08/2024. Approval letter sent to scan center.  Patient must continue to fill through  [facility]  Authorization # [id number]  Refill also sent to [facility] pharmacy. MyChart message sent to patient  Chesley Mires, PharmD, MPH, BCPS, CPP Clinical Pharmacist (Rheumatology and Pulmonology)

## 2023-10-13 DIAGNOSIS — H2512 Age-related nuclear cataract, left eye: Secondary | ICD-10-CM | POA: Diagnosis not present

## 2023-10-13 DIAGNOSIS — H5211 Myopia, right eye: Secondary | ICD-10-CM | POA: Diagnosis not present

## 2023-10-13 DIAGNOSIS — H52223 Regular astigmatism, bilateral: Secondary | ICD-10-CM | POA: Diagnosis not present

## 2023-10-13 DIAGNOSIS — H5202 Hypermetropia, left eye: Secondary | ICD-10-CM | POA: Diagnosis not present

## 2023-10-13 DIAGNOSIS — H524 Presbyopia: Secondary | ICD-10-CM | POA: Diagnosis not present

## 2023-10-13 DIAGNOSIS — E119 Type 2 diabetes mellitus without complications: Secondary | ICD-10-CM | POA: Diagnosis not present

## 2023-10-14 DIAGNOSIS — F4320 Adjustment disorder, unspecified: Secondary | ICD-10-CM | POA: Diagnosis not present

## 2023-10-15 DIAGNOSIS — J3081 Allergic rhinitis due to animal (cat) (dog) hair and dander: Secondary | ICD-10-CM | POA: Diagnosis not present

## 2023-10-15 DIAGNOSIS — J3089 Other allergic rhinitis: Secondary | ICD-10-CM | POA: Diagnosis not present

## 2023-10-15 DIAGNOSIS — J301 Allergic rhinitis due to pollen: Secondary | ICD-10-CM | POA: Diagnosis not present

## 2023-10-21 DIAGNOSIS — F320 Major depressive disorder, single episode, mild: Secondary | ICD-10-CM | POA: Diagnosis not present

## 2023-10-28 ENCOUNTER — Encounter (INDEPENDENT_AMBULATORY_CARE_PROVIDER_SITE_OTHER): Payer: Self-pay | Admitting: Physician Assistant

## 2023-10-28 ENCOUNTER — Ambulatory Visit (INDEPENDENT_AMBULATORY_CARE_PROVIDER_SITE_OTHER): Admitting: Physician Assistant

## 2023-10-28 VITALS — BP 130/77 | HR 88 | Temp 97.8°F | Ht 64.0 in | Wt 230.0 lb

## 2023-10-28 DIAGNOSIS — I152 Hypertension secondary to endocrine disorders: Secondary | ICD-10-CM | POA: Diagnosis not present

## 2023-10-28 DIAGNOSIS — Z6839 Body mass index (BMI) 39.0-39.9, adult: Secondary | ICD-10-CM

## 2023-10-28 DIAGNOSIS — E1159 Type 2 diabetes mellitus with other circulatory complications: Secondary | ICD-10-CM

## 2023-10-28 DIAGNOSIS — Z7984 Long term (current) use of oral hypoglycemic drugs: Secondary | ICD-10-CM

## 2023-10-28 DIAGNOSIS — E669 Obesity, unspecified: Secondary | ICD-10-CM

## 2023-10-28 DIAGNOSIS — E559 Vitamin D deficiency, unspecified: Secondary | ICD-10-CM

## 2023-10-28 DIAGNOSIS — E1169 Type 2 diabetes mellitus with other specified complication: Secondary | ICD-10-CM

## 2023-10-28 DIAGNOSIS — E785 Hyperlipidemia, unspecified: Secondary | ICD-10-CM

## 2023-10-28 DIAGNOSIS — E119 Type 2 diabetes mellitus without complications: Secondary | ICD-10-CM

## 2023-10-28 DIAGNOSIS — F4320 Adjustment disorder, unspecified: Secondary | ICD-10-CM | POA: Diagnosis not present

## 2023-10-28 MED ORDER — VITAMIN D (ERGOCALCIFEROL) 1.25 MG (50000 UNIT) PO CAPS: 50000.0000 [IU] | ORAL_CAPSULE | ORAL | 0 refills | Status: DC

## 2023-10-28 NOTE — Progress Notes (Unsigned)
 SUBJECTIVE: Discussed the use of AI scribe software for clinical note transcription with the patient, who gave verbal consent to proceed.  Chief Complaint: Obesity  Interim History: She has maintained her weight since last visit.  Down 24 lbs overall TBW loss of 9.45%  Sylvia [name] is here to discuss her progress with her obesity treatment plan. She is on the Category 1 Plan and states she is following her eating plan approximately 70 % of the time. She states she is exercising aerobics or walking outside for 15-20 minutes 5 times per week. Sylvia [name] is a 53 year old female with obesity, type 2 diabetes, hypertension, and hyperlipidemia who presents for follow-up of her obesity treatment plan.  She has achieved a total weight loss of 24 pounds, which is a 9.5% reduction in her body weight. She maintains her weight loss through regular exercise and a balanced diet, despite occasional challenges. Her exercise routine includes aerobic activities such as walking or using exercise videos for 15 to 20 minutes, five times a week. She is considering adding strength training to her regimen. Work-related stress is significant, and exercise helps her manage it.  Her type 2 diabetes has improved, with her hemoglobin A1c now at 5.4, down from 7. She attributes this improvement to weight loss and dietary changes, specifically avoiding simple carbohydrates. She is currently taking metformin three times a day without any issues.  Her hypertension is well-managed, and recent lab results indicate normal kidney and liver function. Her cholesterol panel shows a total cholesterol of 198, with HDL at 61 and triglycerides at 84, marking a significant improvement from previous levels.  She has a history of vitamin D  deficiency and is on weekly vitamin D  supplementation. Her recent vitamin D  level is within the target range of 50 to 70. No significant change in energy levels is noted, but she continues her current  supplementation regimen. OBJECTIVE: Visit Diagnoses: Problem List Items Addressed This Visit     New onset type 2 diabetes mellitus (HCC) - Primary   Hypertension associated with diabetes (HCC)   Vitamin D  deficiency - new onset   Relevant Medications   Vitamin D , Ergocalciferol , (DRISDOL ) 1.25 MG (50000 UNIT) CAPS capsule   Obesity (HCC)- Start BMI 43.6 Date 03/24/2023   Other Visit Diagnoses       Hyperlipidemia associated with type 2 diabetes mellitus (HCC)         BMI 39.0-39.9,adult Current BMI 39.5         Type 2 diabetes mellitus Type 2 diabetes mellitus is well-controlled with hemoglobin A1c improved from 7.0% to 5.4%, attributed to weight loss and dietary modifications, including reduced intake of simple carbohydrates and processed foods. Current treatment with metformin is effective. - Continue metformin three times a day - Monitor hemoglobin A1c regularly - Encourage continued dietary modifications and weight management  Obesity Obesity management is progressing with a 24-pound weight loss, equating to a 9.5% reduction in body weight. She is engaging in lifestyle modifications, including regular aerobic exercise and mindful eating, which also aids in stress management. - Encourage continued aerobic exercise, aiming for 15-20 minutes, five times per week - Consider incorporating strength training exercises, such as using ankle weights during walks - Continue mindful eating and lifestyle modifications  Hyperlipidemia Hyperlipidemia management shows improvement in triglyceride levels, now at 84 mg/dL, down from 621 mg/dL. LDL cholesterol remains a concern, necessitating more stringent lipid management with dietary modifications to reduce saturated fat intake. Future consideration may include medication if  LDL goals are not met. - Continue dietary modifications to reduce saturated fat intake - Monitor lipid panel regularly - Discuss potential need for medication if LDL goals  are not met  Vitamin D  deficiency Vitamin D  levels are within the target range of 50-70 ng/mL with current supplementation. Energy levels have not significantly changed, but supplementation will continue to ensure optimal vitamin D  status. - Continue current vitamin D  supplementation - Recheck vitamin D  levels in 4-6 months Meds ordered this encounter  Medications   Vitamin D , Ergocalciferol , (DRISDOL ) 1.25 MG (50000 UNIT) CAPS capsule    Sig: Take 1 capsule (50,000 Units total) by mouth every 7 (seven) days.    Dispense:  4 capsule    Refill:  0    Vitals Temp: 97.8 F (36.6 C) BP: 130/77 Pulse Rate: 88 SpO2: 99 %   Anthropometric Measurements Height: 5\' 4"  (1.626 m) Weight: 230 lb (104.3 kg) BMI (Calculated): 39.46 Weight at Last Visit: 230 lb Weight Lost Since Last Visit: 0 Weight Gained Since Last Visit: 0 Starting Weight: 254 lb Total Weight Loss (lbs): 24 lb (10.9 kg) Peak Weight: 279 lb   Body Composition  Body Fat %: 47.5 % Fat Mass (lbs): 109.4 lbs Muscle Mass (lbs): 114.8 lbs Total Body Water  (lbs): 88.2 lbs Visceral Fat Rating : 14   Other Clinical Data Fasting: no Labs: no Today's Visit #: 11 Starting Date: 03/24/23     ASSESSMENT AND PLAN:  Diet: Sylvia [name] is currently in the action stage of change. As such, her goal is to continue with weight loss efforts. She has agreed to Category 1 Plan.  Exercise: Sylvia [name] has been instructed to work up to a goal of 150 minutes of combined cardio and strengthening exercise per week for weight loss and overall health benefits.   Behavior Modification:  We discussed the following Behavioral Modification Strategies today: increasing lean protein intake, decreasing simple carbohydrates, increasing vegetables, increase H2O intake, increase high fiber foods, meal planning and cooking strategies, avoiding temptations, and planning for success. We discussed various medication options to help Sylvia [name] with her weight loss  efforts and we both agreed to continue current treatment plan, continue to work on nutritional and behavioral strategies to promote weight loss.  .  Return in about 6 weeks (around 12/09/2023).Sylvia [name] She was informed of the importance of frequent follow up visits to maximize her success with intensive lifestyle modifications for her multiple health conditions.  Attestation Statements:   Reviewed by clinician on day of visit: allergies, medications, problem list, medical history, surgical history, family history, social history, and previous encounter notes.   Time spent on visit including pre-visit chart review and post-visit care and charting was 38 minutes.    Sylvia Giel, PA-C

## 2023-11-04 DIAGNOSIS — F4320 Adjustment disorder, unspecified: Secondary | ICD-10-CM | POA: Diagnosis not present

## 2023-11-05 DIAGNOSIS — J3089 Other allergic rhinitis: Secondary | ICD-10-CM | POA: Diagnosis not present

## 2023-11-05 DIAGNOSIS — J301 Allergic rhinitis due to pollen: Secondary | ICD-10-CM | POA: Diagnosis not present

## 2023-11-05 DIAGNOSIS — J3081 Allergic rhinitis due to animal (cat) (dog) hair and dander: Secondary | ICD-10-CM | POA: Diagnosis not present

## 2023-11-10 DIAGNOSIS — Z01419 Encounter for gynecological examination (general) (routine) without abnormal findings: Secondary | ICD-10-CM | POA: Diagnosis not present

## 2023-11-10 DIAGNOSIS — Z1330 Encounter for screening examination for mental health and behavioral disorders, unspecified: Secondary | ICD-10-CM | POA: Diagnosis not present

## 2023-11-11 ENCOUNTER — Other Ambulatory Visit: Payer: Self-pay | Admitting: Obstetrics and Gynecology

## 2023-11-11 DIAGNOSIS — F4320 Adjustment disorder, unspecified: Secondary | ICD-10-CM | POA: Diagnosis not present

## 2023-11-11 DIAGNOSIS — Z1231 Encounter for screening mammogram for malignant neoplasm of breast: Secondary | ICD-10-CM

## 2023-11-17 DIAGNOSIS — J3089 Other allergic rhinitis: Secondary | ICD-10-CM | POA: Diagnosis not present

## 2023-11-17 DIAGNOSIS — J301 Allergic rhinitis due to pollen: Secondary | ICD-10-CM | POA: Diagnosis not present

## 2023-11-17 DIAGNOSIS — J3081 Allergic rhinitis due to animal (cat) (dog) hair and dander: Secondary | ICD-10-CM | POA: Diagnosis not present

## 2023-11-18 DIAGNOSIS — F4320 Adjustment disorder, unspecified: Secondary | ICD-10-CM | POA: Diagnosis not present

## 2023-11-23 ENCOUNTER — Encounter (INDEPENDENT_AMBULATORY_CARE_PROVIDER_SITE_OTHER): Payer: Self-pay | Admitting: Physician Assistant

## 2023-11-25 ENCOUNTER — Ambulatory Visit
Admission: RE | Admit: 2023-11-25 | Discharge: 2023-11-25 | Disposition: A | Discharge status: Home / Self Care | Source: Ambulatory Visit | Attending: Obstetrics and Gynecology | Admitting: Obstetrics and Gynecology

## 2023-11-25 DIAGNOSIS — Z1231 Encounter for screening mammogram for malignant neoplasm of breast: Secondary | ICD-10-CM | POA: Diagnosis not present

## 2023-11-25 DIAGNOSIS — F4320 Adjustment disorder, unspecified: Secondary | ICD-10-CM | POA: Diagnosis not present

## 2023-12-02 DIAGNOSIS — F4320 Adjustment disorder, unspecified: Secondary | ICD-10-CM | POA: Diagnosis not present

## 2023-12-03 DIAGNOSIS — J301 Allergic rhinitis due to pollen: Secondary | ICD-10-CM | POA: Diagnosis not present

## 2023-12-03 DIAGNOSIS — J3081 Allergic rhinitis due to animal (cat) (dog) hair and dander: Secondary | ICD-10-CM | POA: Diagnosis not present

## 2023-12-03 DIAGNOSIS — J3089 Other allergic rhinitis: Secondary | ICD-10-CM | POA: Diagnosis not present

## 2023-12-08 NOTE — Progress Notes (Signed)
 SUBJECTIVE: Discussed the use of AI scribe software for clinical note transcription with the patient, who gave verbal consent to proceed.  Chief Complaint: Obesity  Interim History: She is down 1 lb since her last visit.    She comes in today with her husband, Sylvia [name],  for added support.    She has been struggling with multiple concerns over the past month.   Down 25 lbs overall TBW loss of 9.8%  Sylvia [name] is here to discuss her progress with her obesity treatment plan. She is on the Category 1 Plan and states she is following her eating plan approximately 50 % of the time. She states she is exercising 5-10 minutes 3 times per week.  Sylvia [name] is a 53 year old female who presents for a follow-up on her obesity treatment plan. Her husband Sylvia [name] accompanied her to the visit.  She has been experiencing mental and emotional challenges. She has successfully lost 25 pounds. Her goal is to reach 225 pounds by the end of the year and is currently 229 lbs. She has been incorporating healthy eating habits, such as consuming a variety of fruits instead of less healthy options.  She has a history of type 2 diabetes and is currently taking metformin 500 mg three times a day. She has been focusing on her diet, including using microwave meals and healthy snacks, to help manage her weight and diabetes.  She is managing hyperlipidemia with dietary improvements and hypertension with Norvasc  10 mg daily. She has been engaging in strength training exercises, such as using a [employer] for squats and hand weights, to improve her physical health.  She is on ergocalciferol  50,000 units once weekly for vitamin D  deficiency. Her breathing hasn't been great, possibly due to allergies, which has limited her ability to engage in aerobic activities. She is on medications for severe persistent asthma.   She is experiencing significant work-related stress due to recent job uncertainties and layoffs, impacting her  mental health and leading to difficulty sleeping and increased anxiety. She is working with a Paramedic and has been encouraged to communicate more with her husband and utilize stress management strategies.  Her sleep is affected, as she goes to bed around 12:30 AM and wakes up at 7:00 AM. She struggles to turn her brain off at night, often ruminating over the day's events and preparing for the next day. OBJECTIVE: Visit Diagnoses: Problem List Items Addressed This Visit     New onset type 2 diabetes mellitus (HCC) - Primary   Hypertension associated with diabetes (HCC)   Vitamin D  deficiency - new onset   Relevant Medications   Vitamin D , Ergocalciferol , (DRISDOL ) 1.25 MG (50000 UNIT) CAPS capsule   Obesity (HCC)- Start BMI 43.6 Date 03/24/2023   Other Visit Diagnoses       Stress         BMI 39.0-39.9,adult Current BMI 39.4         Obesity She has successfully lost 25 pounds but is experiencing mental and emotional challenges related to multiple stressors especially at work . She feels overwhelmed by the pressure of maintaining her weight loss amidst other life stressors.  Emphasized the importance of sustainable lifestyle changes, stress management, and self-care in achieving long-term weight maintenance. - Encourage continuation of current weight management strategies, including healthy eating and exercise. - Advise incorporating stress management techniques, such as mindfulness exercises and a bedtime routine, to improve mental well-being. - Encourage maintaining a balanced diet with a good  protein source at every meal. - Advise continuing sufficient water  intake and using natural herb tea bags for flavoring. - Discuss the importance of taking regular breaks during work to reduce stress. - Encourage engaging in positive affirmations and self-reflection to improve mental well-being.  Type 2 Diabetes Mellitus She is managing her diabetes with metformin 500 mg three times a day  without issues. Lab Results  Component Value Date   HGBA1C 5.4 09/30/2023   HGBA1C 7.0 (H) 03/24/2023   HGBA1C 5.2 02/09/2012   Lab Results  Component Value Date   LDLCALC 122 (H) 09/30/2023   CREATININE 0.70 09/30/2023   INSULIN   Date Value Ref Range Status  03/24/2023 12.0 2.6 - 24.9 uIU/mL Final  She is working  on nutrition plan to decrease simple carbohydrates, increase lean proteins and exercise to promote weight loss and improve glycemic control . Plan: continue current management with metformin 500 mg TID and nutrition plan and exercise. Focus on protein at each meal ~ 25-30 grams per meal.    Hypertension She is on Norvasc  10 mg daily for blood pressure control without issues. BP Readings from Last 3 Encounters:  12/09/23 133/80  10/28/23 130/77  10/09/23 124/70   Lab Results  Component Value Date   NA 140 09/30/2023   CL 101 09/30/2023   K 3.9 09/30/2023   CO2 22 09/30/2023   BUN 11 09/30/2023   CREATININE 0.70 09/30/2023   EGFR 104 09/30/2023   CALCIUM 9.5 09/30/2023   PHOS 2.2 (L) 02/09/2012   ALBUMIN 4.2 09/30/2023   GLUCOSE 99 09/30/2023  Plan: Continue to work on nutrition plan to promote weight loss and improve BP control.  Continue Norvasc  10 mg daily.    Vitamin D  Deficiency She is on ergocalciferol  50,000 units once weekly for vitamin D  deficiency. A refill for her vitamin D  prescription is needed. No N/V or muscle weakness with Ergocalciferol  Last vitamin D  Lab Results  Component Value Date   VD25OH 49.7 09/30/2023   Low vitamin D  levels can be associated with adiposity and may result in leptin resistance and weight gain. Also associated with fatigue.  Currently on vitamin D  supplementation without any adverse effects such as nausea, vomiting or muscle weakness.  Continue ergocalciferol  50,000 units once weekly.  - Refill ergocalciferol  prescription and send to [facility] on [address] [facility] [address]. Meds ordered this encounter  Medications   Vitamin D ,  Ergocalciferol , (DRISDOL ) 1.25 MG (50000 UNIT) CAPS capsule    Sig: Take 1 capsule (50,000 Units total) by mouth every 7 (seven) days.    Dispense:  4 capsule    Refill:  0     Stress Experiencing mental and emotional challenges related to multiple stressors especially at work . She feels overwhelmed by the pressure of maintaining her weight loss amidst other life stressors.  Emphasized the importance of sustainable lifestyle changes, stress management, and self-care in achieving long-term weight maintenance. - Encourage continuation of current weight management strategies, including healthy eating and exercise which should help to provide resilience against her current challenges . - Advise incorporating stress management techniques, such as mindfulness exercises and a bedtime routine, to improve mental well-being and aid sleep habits during increased stress. - Encourage maintaining a balanced diet with a good protein source at every meal. - Advise continuing sufficient water  intake and using natural herb tea bags for flavoring. - Discuss the importance of taking regular breaks during work to reduce stress. - Encourage engaging in positive affirmations and self-reflection to improve mental well-being. Vitals  Temp: 98.6 F (37C) BP: 133/80 Pulse Rate: 82 SpO2: 99 %     Anthropometric Measurements Height: 5\' 4"  (1.626 m) Weight: 229 lb (103.9 kg) BMI (Calculated): 39.29 Weight at Last Visit: 230 lb Weight Lost Since Last Visit: 1 Weight Gained Since Last Visit: 0 Starting Weight: 254 lb Total Weight Loss (lbs): 25 lb (11.3 kg) Peak Weight: 279 lb     Body Composition  Body Fat %: 47.3 % Fat Mass (lbs): 108.6 lbs Muscle Mass (lbs): 115 lbs Total Body Water  (lbs): 85.6 lbs Visceral Fat Rating : 14     Other Clinical Data Fasting: no Labs: no Today's Visit #: 12 Starting Date: 03/24/23   ASSESSMENT AND PLAN:  Diet: Sylvia [name] is currently in the action stage of change. As  such, her goal is to continue with weight loss efforts. She has agreed to Category 1 Plan.  Exercise: Sylvia [name] has been instructed to work up to a goal of 150 minutes of combined cardio and strengthening exercise per week for weight loss and overall health benefits.   Behavior Modification:  We discussed the following Behavioral Modification Strategies today: increasing lean protein intake, decreasing simple carbohydrates, increasing vegetables, increase H2O intake, increase high fiber foods, no skipping meals, meal planning and cooking strategies, emotional eating strategies , avoiding temptations, and planning for success. We discussed various medication options to help Sylvia [name] with her weight loss efforts and we both agreed to continue current treatment plan, continue to work on nutritional and behavioral strategies to promote weight loss.  .  Follow up in ~ 8 weeks . She was informed of the importance of frequent follow up visits to maximize her success with intensive lifestyle modifications for her multiple health conditions.  Attestation Statements:   Reviewed by clinician on day of visit: allergies, medications, problem list, medical history, surgical history, family history, social history, and previous encounter notes.   Time spent on visit including pre-visit chart review and post-visit care and charting was 40 minutes.    Dailee Manalang, PA-C

## 2023-12-09 ENCOUNTER — Encounter (INDEPENDENT_AMBULATORY_CARE_PROVIDER_SITE_OTHER): Payer: Self-pay | Admitting: Physician Assistant

## 2023-12-09 ENCOUNTER — Ambulatory Visit (INDEPENDENT_AMBULATORY_CARE_PROVIDER_SITE_OTHER): Admitting: Physician Assistant

## 2023-12-09 VITALS — BP 133/80 | HR 82 | Temp 98.6°F | Ht 64.0 in | Wt 229.0 lb

## 2023-12-09 DIAGNOSIS — E559 Vitamin D deficiency, unspecified: Secondary | ICD-10-CM

## 2023-12-09 DIAGNOSIS — Z7984 Long term (current) use of oral hypoglycemic drugs: Secondary | ICD-10-CM

## 2023-12-09 DIAGNOSIS — I152 Hypertension secondary to endocrine disorders: Secondary | ICD-10-CM

## 2023-12-09 DIAGNOSIS — F439 Reaction to severe stress, unspecified: Secondary | ICD-10-CM | POA: Diagnosis not present

## 2023-12-09 DIAGNOSIS — E1159 Type 2 diabetes mellitus with other circulatory complications: Secondary | ICD-10-CM

## 2023-12-09 DIAGNOSIS — E119 Type 2 diabetes mellitus without complications: Secondary | ICD-10-CM

## 2023-12-09 DIAGNOSIS — E1169 Type 2 diabetes mellitus with other specified complication: Secondary | ICD-10-CM

## 2023-12-09 DIAGNOSIS — E669 Obesity, unspecified: Secondary | ICD-10-CM

## 2023-12-09 DIAGNOSIS — F4320 Adjustment disorder, unspecified: Secondary | ICD-10-CM | POA: Diagnosis not present

## 2023-12-09 DIAGNOSIS — Z6839 Body mass index (BMI) 39.0-39.9, adult: Secondary | ICD-10-CM

## 2023-12-09 MED ORDER — VITAMIN D (ERGOCALCIFEROL) 1.25 MG (50000 UNIT) PO CAPS: 50000.0000 [IU] | ORAL_CAPSULE | ORAL | 0 refills | Status: DC

## 2023-12-10 DIAGNOSIS — J3089 Other allergic rhinitis: Secondary | ICD-10-CM | POA: Diagnosis not present

## 2023-12-10 DIAGNOSIS — J301 Allergic rhinitis due to pollen: Secondary | ICD-10-CM | POA: Diagnosis not present

## 2023-12-10 DIAGNOSIS — J3081 Allergic rhinitis due to animal (cat) (dog) hair and dander: Secondary | ICD-10-CM | POA: Diagnosis not present

## 2023-12-23 DIAGNOSIS — F4320 Adjustment disorder, unspecified: Secondary | ICD-10-CM | POA: Diagnosis not present

## 2023-12-25 DIAGNOSIS — J3081 Allergic rhinitis due to animal (cat) (dog) hair and dander: Secondary | ICD-10-CM | POA: Diagnosis not present

## 2023-12-25 DIAGNOSIS — J3089 Other allergic rhinitis: Secondary | ICD-10-CM | POA: Diagnosis not present

## 2023-12-25 DIAGNOSIS — J301 Allergic rhinitis due to pollen: Secondary | ICD-10-CM | POA: Diagnosis not present

## 2023-12-29 DIAGNOSIS — F4320 Adjustment disorder, unspecified: Secondary | ICD-10-CM | POA: Diagnosis not present

## 2024-01-05 DIAGNOSIS — J329 Chronic sinusitis, unspecified: Secondary | ICD-10-CM | POA: Diagnosis not present

## 2024-01-05 DIAGNOSIS — Z8709 Personal history of other diseases of the respiratory system: Secondary | ICD-10-CM | POA: Diagnosis not present

## 2024-01-07 ENCOUNTER — Encounter (INDEPENDENT_AMBULATORY_CARE_PROVIDER_SITE_OTHER): Payer: Self-pay | Admitting: Physician Assistant

## 2024-01-07 DIAGNOSIS — J3089 Other allergic rhinitis: Secondary | ICD-10-CM | POA: Diagnosis not present

## 2024-01-07 DIAGNOSIS — J3081 Allergic rhinitis due to animal (cat) (dog) hair and dander: Secondary | ICD-10-CM | POA: Diagnosis not present

## 2024-01-07 DIAGNOSIS — J301 Allergic rhinitis due to pollen: Secondary | ICD-10-CM | POA: Diagnosis not present

## 2024-01-13 ENCOUNTER — Other Ambulatory Visit (INDEPENDENT_AMBULATORY_CARE_PROVIDER_SITE_OTHER): Payer: Self-pay | Admitting: Physician Assistant

## 2024-01-13 DIAGNOSIS — E559 Vitamin D deficiency, unspecified: Secondary | ICD-10-CM

## 2024-01-15 DIAGNOSIS — J3089 Other allergic rhinitis: Secondary | ICD-10-CM | POA: Diagnosis not present

## 2024-01-15 DIAGNOSIS — J3081 Allergic rhinitis due to animal (cat) (dog) hair and dander: Secondary | ICD-10-CM | POA: Diagnosis not present

## 2024-01-15 DIAGNOSIS — J301 Allergic rhinitis due to pollen: Secondary | ICD-10-CM | POA: Diagnosis not present

## 2024-01-22 DIAGNOSIS — J3089 Other allergic rhinitis: Secondary | ICD-10-CM | POA: Diagnosis not present

## 2024-01-22 DIAGNOSIS — J3081 Allergic rhinitis due to animal (cat) (dog) hair and dander: Secondary | ICD-10-CM | POA: Diagnosis not present

## 2024-01-22 DIAGNOSIS — J301 Allergic rhinitis due to pollen: Secondary | ICD-10-CM | POA: Diagnosis not present

## 2024-01-28 DIAGNOSIS — J301 Allergic rhinitis due to pollen: Secondary | ICD-10-CM | POA: Diagnosis not present

## 2024-01-28 DIAGNOSIS — J3081 Allergic rhinitis due to animal (cat) (dog) hair and dander: Secondary | ICD-10-CM | POA: Diagnosis not present

## 2024-01-28 DIAGNOSIS — J3089 Other allergic rhinitis: Secondary | ICD-10-CM | POA: Diagnosis not present

## 2024-02-01 ENCOUNTER — Ambulatory Visit (INDEPENDENT_AMBULATORY_CARE_PROVIDER_SITE_OTHER): Admitting: Physician Assistant

## 2024-02-10 DIAGNOSIS — F4320 Adjustment disorder, unspecified: Secondary | ICD-10-CM | POA: Diagnosis not present

## 2024-02-12 DIAGNOSIS — J3089 Other allergic rhinitis: Secondary | ICD-10-CM | POA: Diagnosis not present

## 2024-02-12 DIAGNOSIS — J301 Allergic rhinitis due to pollen: Secondary | ICD-10-CM | POA: Diagnosis not present

## 2024-02-12 DIAGNOSIS — J3081 Allergic rhinitis due to animal (cat) (dog) hair and dander: Secondary | ICD-10-CM | POA: Diagnosis not present

## 2024-02-14 NOTE — Progress Notes (Signed)
 SUBJECTIVE: Discussed the use of AI scribe software for clinical note transcription with the patient, who gave verbal consent to proceed.  Chief Complaint: Obesity  Interim History: She is up 4 lbs since her last visit.   Sylvia [name] is here to discuss her progress with her obesity treatment plan. She is on the Category 2 Plan and states she is following her eating plan approximately 50-60 % of the time. She states she is strength training/cardio/yoga/walking 20/30/30/30 minutes 3-4/1/3-4/3-4  times per week.  Sylvia [name] is a 53 year old female with obesity who presents for follow-up of her obesity treatment plan.  She has been struggling to adhere to her obesity treatment plan due to her daughter's illness, which required her attention. Despite these challenges, she has been mindful of her diet and exercise, resulting in a gain of 1.4 pounds in muscle mass and an increase of 2.2 pounds in adipose tissue.  She has type 2 diabetes and is currently taking metformin  500 mg three times daily. She is open to adjusting her dosage as she is not on the maximum dose.  She has hypertension and is on amlodipine  10 mg daily. She exercises regularly to help manage her blood pressure and is conscious of maintaining her health to prevent any spikes.  She is being treated for vitamin D  deficiency with ergocalciferol  50,000 units once weekly. During a recent trip, she did not have her prescription but took over-the-counter vitamin D  to avoid depletion.  She has a history of emotional and stress eating, which has been challenging to manage during her daughter's illness. Despite these challenges, she has been trying to make healthier food choices and maintain her exercise routine.  She has a history of endometriosis and recently underwent surgery in [place] where her appendix was removed, and endometriosis was treated.  Plan fasting labs next OV OBJECTIVE: Visit Diagnoses: Problem List Items Addressed  This Visit     New onset type 2 diabetes mellitus (HCC) - Primary   Relevant Medications   metFORMIN  (GLUCOPHAGE ) 500 MG tablet   Hypertension associated with diabetes (HCC)   Relevant Medications   amLODipine  (NORVASC ) 5 MG tablet   metFORMIN  (GLUCOPHAGE ) 500 MG tablet   Vitamin D  deficiency - new onset   Relevant Medications   Vitamin D , Ergocalciferol , (DRISDOL ) 1.25 MG (50000 UNIT) CAPS capsule   Obesity (HCC)- Start BMI 43.6 Date 03/24/2023   Relevant Medications   metFORMIN  (GLUCOPHAGE ) 500 MG tablet   BMI 40.0-44.9, adult (HCC) Current BMI 43.1   Relevant Medications   metFORMIN  (GLUCOPHAGE ) 500 MG tablet   Other Visit Diagnoses       Hyperlipidemia associated with type 2 diabetes mellitus (HCC)       Relevant Medications   amLODipine  (NORVASC ) 5 MG tablet   metFORMIN  (GLUCOPHAGE ) 500 MG tablet     Stress          Type 2 diabetes mellitus Currently on metformin  500 mg three times daily with no significant gastrointestinal side effects. Open to dosage adjustment to improve glycemic control. - Increase/refill metformin  to 2000 mg daily, with 1000 mg taken twice daily. - Monitor for gastrointestinal upset and revert to previous dosing if necessary. - Conduct a six-week trial of the increased metformin  dosage. - Plan fasting labs next visit in Sept.  Meds ordered this encounter  Medications   Vitamin D , Ergocalciferol , (DRISDOL ) 1.25 MG (50000 UNIT) CAPS capsule    Sig: Take 1 capsule (50,000 Units total) by mouth every 7 (seven)  days.    Dispense:  4 capsule    Refill:  0   metFORMIN  (GLUCOPHAGE ) 500 MG tablet    Sig: Take 2 tablets (1,000 mg total) by mouth 2 (two) times daily with a meal. May adjust if having GI upset back to previous 500 mg at each meal if needed    Dispense:  120 tablet    Refill:  1    Hypertension Blood pressure management is a concern, especially during stress. She is mindful of lifestyle modifications for control. On Norvasc  5 mg daily  without SE.  BP Readings from Last 3 Encounters:  02/15/24 130/77  12/09/23 133/80  10/28/23 130/77   Lab Results  Component Value Date   NA 140 09/30/2023   CL 101 09/30/2023   K 3.9 09/30/2023   CO2 22 09/30/2023   BUN 11 09/30/2023   CREATININE 0.70 09/30/2023   EGFR 104 09/30/2023   CALCIUM 9.5 09/30/2023   PHOS 2.2 (L) 02/09/2012   ALBUMIN 4.2 09/30/2023   GLUCOSE 99 09/30/2023  Continue Norvasc  5 mg daily.  Continue to work on nutrition plan to promote weight loss and improve BP control.  - Encourage continuation of physical activity to aid in blood pressure management.  Obesity Weight gain of 4 pounds since last visit, with 1.4 pounds in muscle mass and 2.2 pounds in adipose tissue. Personal stressors have impacted adherence to the weight management plan. - Provide a fresh copy of the grocery list and Category 2 nutrition plan. - Encourage continuation of physical activity and mindful eating. - Support the decision to reset and refocus on her weight management plan.  Emotional eating/stress Acknowledges emotional and stress-related eating, particularly during recent personal challenges. - Support efforts to reset and refocus on mindful eating habits.  Hyperlipidemia Not on statin therapy.  HDL- at goal. LDL not at goal. Trig- at goal.  Lab Results  Component Value Date   CHOL 198 09/30/2023   CHOL 198 03/24/2023   Lab Results  Component Value Date   HDL 61 09/30/2023   HDL 62 03/24/2023   Lab Results  Component Value Date   LDLCALC 122 (H) 09/30/2023   LDLCALC 111 (H) 03/24/2023   Lab Results  Component Value Date   TRIG 84 09/30/2023   TRIG 141 03/24/2023   Lab Results  Component Value Date   CHOLHDL 3.2 09/30/2023   No results found for: LDLDIRECT  - Plan to re-assess lipid levels during next lab evaluation and consideration for statin therapy  Continue to work on nutrition plan -decreasing simple carbohydrates, increasing lean proteins,  decreasing saturated fats and cholesterol , avoiding trans fats and exercise as able to promote weight loss, improve lipids and decrease cardiovascular risks.  Vitamin D  deficiency Currently on ergocalciferol  50,000 units once weekly and takes over-the-counter vitamin D  during travel. No N/V or muscle weakness with Ergo. Level at goal at last lab Last vitamin D  Lab Results  Component Value Date   VD25OH 49.7 09/30/2023   - Continue/refill ergocalciferol  50,000 units once weekly. -Low vitamin D  levels can be associated with adiposity and may result in leptin resistance and weight gain. Also associated with fatigue.  Currently on vitamin D  supplementation without any adverse effects such as nausea, vomiting or muscle weakness.  Recheck level next visit.    General Health Maintenance Working on maintaining overall health through diet and exercise despite recent personal challenges. - Encourage continuation of healthy lifestyle choices. Vitals Temp: 98.2 F (36.8 C) BP: 130/77 Pulse  Rate: 78 SpO2: 100 %   Anthropometric Measurements Height: 5' 4 (1.626 m) Weight: 233 lb (105.7 kg) BMI (Calculated): 39.97 Weight at Last Visit: 229 lb Weight Lost Since Last Visit: 0 Weight Gained Since Last Visit: 4 lb Starting Weight: 254 lb Total Weight Loss (lbs): 21 lb (9.526 kg)   Body Composition  Body Fat %: 47.5 % Fat Mass (lbs): 110.8 lbs Muscle Mass (lbs): 116.4 lbs Total Body Water  (lbs): 85.6 lbs Visceral Fat Rating : 14   Other Clinical Data Fasting: no Labs: no Today's Visit #: 13 Starting Date: 03/24/23     ASSESSMENT AND PLAN:  Diet: Sylvia [name] is currently in the action stage of change. As such, her goal is to continue with weight loss efforts. She has agreed to Category 2 Plan.  Exercise: Sylvia [name] has been instructed to work up to a goal of 150 minutes of combined cardio and strengthening exercise per week for weight loss and overall health benefits.   Behavior  Modification:  We discussed the following Behavioral Modification Strategies today: increasing lean protein intake, decreasing simple carbohydrates, increasing vegetables, increase H2O intake, increase high fiber foods, no skipping meals, meal planning and cooking strategies, emotional eating strategies , avoiding temptations, and planning for success. We discussed various medication options to help Sylvia [name] with her weight loss efforts and we both agreed to increase metformin  to 1000 mg twice daily for Type 2 diabetes and continue other medications as prescribed.  Return in about 30 days (around 03/16/2024) for Fasting Lab.Sylvia [name] She was informed of the importance of frequent follow up visits to maximize her success with intensive lifestyle modifications for her multiple health conditions.  Attestation Statements:   Reviewed by clinician on day of visit: allergies, medications, problem list, medical history, surgical history, family history, social history, and previous encounter notes.   Time spent on visit including pre-visit chart review and post-visit care and charting was 37 minutes.    Jireh Elmore, PA-C

## 2024-02-15 ENCOUNTER — Encounter (INDEPENDENT_AMBULATORY_CARE_PROVIDER_SITE_OTHER): Payer: Self-pay | Admitting: Physician Assistant

## 2024-02-15 ENCOUNTER — Ambulatory Visit (INDEPENDENT_AMBULATORY_CARE_PROVIDER_SITE_OTHER): Admitting: Physician Assistant

## 2024-02-15 VITALS — BP 130/77 | HR 78 | Temp 98.2°F | Ht 64.0 in | Wt 233.0 lb

## 2024-02-15 DIAGNOSIS — E1159 Type 2 diabetes mellitus with other circulatory complications: Secondary | ICD-10-CM

## 2024-02-15 DIAGNOSIS — Z7984 Long term (current) use of oral hypoglycemic drugs: Secondary | ICD-10-CM

## 2024-02-15 DIAGNOSIS — F439 Reaction to severe stress, unspecified: Secondary | ICD-10-CM

## 2024-02-15 DIAGNOSIS — I152 Hypertension secondary to endocrine disorders: Secondary | ICD-10-CM | POA: Diagnosis not present

## 2024-02-15 DIAGNOSIS — E119 Type 2 diabetes mellitus without complications: Secondary | ICD-10-CM

## 2024-02-15 DIAGNOSIS — E785 Hyperlipidemia, unspecified: Secondary | ICD-10-CM | POA: Diagnosis not present

## 2024-02-15 DIAGNOSIS — F5089 Other specified eating disorder: Secondary | ICD-10-CM

## 2024-02-15 DIAGNOSIS — E1169 Type 2 diabetes mellitus with other specified complication: Secondary | ICD-10-CM | POA: Diagnosis not present

## 2024-02-15 DIAGNOSIS — Z6839 Body mass index (BMI) 39.0-39.9, adult: Secondary | ICD-10-CM

## 2024-02-15 DIAGNOSIS — E559 Vitamin D deficiency, unspecified: Secondary | ICD-10-CM

## 2024-02-15 DIAGNOSIS — Z6841 Body Mass Index (BMI) 40.0 and over, adult: Secondary | ICD-10-CM

## 2024-02-15 MED ORDER — VITAMIN D (ERGOCALCIFEROL) 1.25 MG (50000 UNIT) PO CAPS: 50000.0000 [IU] | ORAL_CAPSULE | ORAL | 0 refills | Status: DC

## 2024-02-15 MED ORDER — METFORMIN HCL 500 MG PO TABS: 1000.0000 mg | ORAL_TABLET | Freq: Two times a day (BID) | ORAL | 1 refills | Status: DC

## 2024-02-17 DIAGNOSIS — F4320 Adjustment disorder, unspecified: Secondary | ICD-10-CM | POA: Diagnosis not present

## 2024-02-26 DIAGNOSIS — J301 Allergic rhinitis due to pollen: Secondary | ICD-10-CM | POA: Diagnosis not present

## 2024-02-26 DIAGNOSIS — J3089 Other allergic rhinitis: Secondary | ICD-10-CM | POA: Diagnosis not present

## 2024-02-26 DIAGNOSIS — J3081 Allergic rhinitis due to animal (cat) (dog) hair and dander: Secondary | ICD-10-CM | POA: Diagnosis not present

## 2024-03-02 DIAGNOSIS — F4320 Adjustment disorder, unspecified: Secondary | ICD-10-CM | POA: Diagnosis not present

## 2024-03-09 DIAGNOSIS — F4320 Adjustment disorder, unspecified: Secondary | ICD-10-CM | POA: Diagnosis not present

## 2024-03-16 ENCOUNTER — Ambulatory Visit (INDEPENDENT_AMBULATORY_CARE_PROVIDER_SITE_OTHER): Admitting: Physician Assistant

## 2024-03-17 DIAGNOSIS — J301 Allergic rhinitis due to pollen: Secondary | ICD-10-CM | POA: Diagnosis not present

## 2024-03-17 DIAGNOSIS — J3089 Other allergic rhinitis: Secondary | ICD-10-CM | POA: Diagnosis not present

## 2024-03-17 DIAGNOSIS — J3081 Allergic rhinitis due to animal (cat) (dog) hair and dander: Secondary | ICD-10-CM | POA: Diagnosis not present

## 2024-03-22 NOTE — Progress Notes (Signed)
 SUBJECTIVE: Discussed the use of AI scribe software for clinical note transcription with the patient, who gave verbal consent to proceed.  Chief Complaint: Obesity  Interim History: She has maintained her weight since her last visit.  Down 21 lbs overall TBW loss of 8.3%  Sylvia [name] is here to discuss her progress with her obesity treatment plan. She is on the Category 1 Plan and states she is following her eating plan approximately 50 % of the time. She states she is exercising aerobics 20-30 minutes 2 times per week.  Sylvia [name] is a 53 year old female who presents for follow-up of her obesity treatment plan.  She has been taking metformin  with food to prevent gastrointestinal upset. She reports about 50% compliance with her treatment plan due to life stressors.  She describes being in 'survival mode' due to personal and work-related stressors, including family health issues and a death in the family, impacting her ability to exercise and adhere to her diet plan. Her exercise routine has shifted to yoga, Pilates, and walking with her husband for stress relief rather than intense workouts.  Regarding her diet, she is making mindful food choices without strictly adhering to her plan. She avoids high fructose corn syrup, is conscious of sodium and calorie content, and has switched from tuna to chicken for variety. She uses a slow cooker for meal preparation and aims for a daily intake of 1000-1200 calories and at least 75 grams of protein, though she sometimes falls short of protein goal.   She has been journaling to cope with stress and is focusing on long-term lifestyle changes. She is working with a therapist to address stressors but would like to pursue evaluation with our Psychologist, Dr. Sharron as well to provide further guidance .  Despite not reaching her weight goal for the year, she remains optimistic and is concentrating on sustainable eating habits. OBJECTIVE: Visit  Diagnoses: Problem List Items Addressed This Visit     New onset type 2 diabetes mellitus (HCC) - Primary   Relevant Medications   metFORMIN  (GLUCOPHAGE ) 500 MG tablet   Hypertension associated with diabetes (HCC)   Relevant Medications   metFORMIN  (GLUCOPHAGE ) 500 MG tablet   Vitamin D  deficiency - new onset   Relevant Medications   Vitamin D , Ergocalciferol , (DRISDOL ) 1.25 MG (50000 UNIT) CAPS capsule   Obesity (HCC)- Start BMI 43.6 Date 03/24/2023   Relevant Medications   metFORMIN  (GLUCOPHAGE ) 500 MG tablet   BMI 40.0-44.9, adult (HCC) Current BMI 43.1   Relevant Medications   metFORMIN  (GLUCOPHAGE ) 500 MG tablet   Other Visit Diagnoses       Stress         Obesity Obesity management is ongoing with dietary modifications and exercise. She is compliant with approximately 50% of the plan, maintaining her weight despite high stress levels. She consumes 1000-1200 calories per day with at least 75 grams of protein. The goal is to increase protein intake to 90 grams per day. She incorporates yoga, Pilates, and walking into her routine, although exercise intensity is low due to stress. She focuses on cleaner eating, avoiding high sodium and high fructose corn syrup products, and uses resources like [employer] for meal planning. She is considering pre-prepared healthy meals to reduce stress. - Continue metformin  as prescribed - Increase protein intake to 90 grams per day - Incorporate more variety in diet to prevent fatigue - Consider using pre-prepared healthy meals from [employer] - Continue current exercise regimen  with focus on stress relief activities like yoga and walking  Type 2 Diabetes Mellitus with hyperglycemia, without long-term current use of insulin  HgbA1c is at goal. Last A1c was 5.4 Medication(s): metformin  500 mg TID, but not always remembering or having time or eating consistently to take 3 times per day.  No GI upset with metformin , but not always taking  consistently TID.  Lab Results  Component Value Date   HGBA1C 5.4 09/30/2023   HGBA1C 7.0 (H) 03/24/2023   HGBA1C 5.2 02/09/2012   Lab Results  Component Value Date   LDLCALC 122 (H) 09/30/2023   CREATININE 0.70 09/30/2023   No results found for: GFR  Plan: Continue and refill  and increase evening dose tometformin 1000 mg BID Meds ordered this encounter  Medications   metFORMIN  (GLUCOPHAGE ) 500 MG tablet    Sig: Take 2 tablets (1,000 mg total) by mouth 2 (two) times daily with a meal. May adjust if having GI upset back to previous 500 mg at each meal if needed    Dispense:  120 tablet    Refill:  1   Vitamin D , Ergocalciferol , (DRISDOL ) 1.25 MG (50000 UNIT) CAPS capsule    Sig: Take 1 capsule (50,000 Units total) by mouth every 7 (seven) days.    Dispense:  4 capsule    Refill:  0    Hypertension Hypertension well controlled, asymptomatic, and no significant medication side effects noted.  Medication(s): amlodipine  5 mg daily  BP Readings from Last 3 Encounters:  03/23/24 135/76  02/15/24 130/77  12/09/23 133/80   Lab Results  Component Value Date   CREATININE 0.70 09/30/2023   CREATININE 0.64 03/24/2023   CREATININE 0.70 01/22/2023   No results found for: GFR  Plan: Continue all antihypertensives at current dosages. Continue to work on nutrition plan to promote weight loss and improve BP control.   Vitamin D  Deficiency Vitamin D  is at goal of 50.  Most recent vitamin D  level was 49.7. She is on  prescription ergocalciferol  50,000 IU weekly. Lab Results  Component Value Date   VD25OH 49.7 09/30/2023   VD25OH 25.3 (L) 03/24/2023    Plan: Continue and refill  prescription ergocalciferol  50,000 IU weekly Low vitamin D  levels can be associated with adiposity and may result in leptin resistance and weight gain. Also associated with fatigue.  Currently on vitamin D  supplementation without any adverse effects such as nausea, vomiting or muscle weakness.    Psychosocial stressors She is experiencing significant psychosocial stressors, including family health issues and increased work Counselling psychologist. She manages stress through mindfulness and journaling, acknowledging her feelings and focusing on internal change. She is considering additional counseling support to address stress and emotional eating, and has discussed this with her therapist and husband. - Refer to Dr. Sharron for counseling focused on stress and emotional eating - Continue current therapy with Dr. Joshua - Utilize journaling and mindfulness techniques to manage stress  Vitals Temp: 98.9 F (37.2 C) BP: 135/76 Pulse Rate: 79 SpO2: 99 %   Anthropometric Measurements Height: 5' 4 (1.626 m) Weight: 233 lb (105.7 kg) BMI (Calculated): 39.97 Weight at Last Visit: 233 lb Weight Lost Since Last Visit: 0 Weight Gained Since Last Visit: 0 Starting Weight: 254 lb Total Weight Loss (lbs): 21 lb (9.526 kg)   Body Composition  Body Fat %: 47.5 % Fat Mass (lbs): 110.6 lbs Muscle Mass (lbs): 116.2 lbs Total Body Water  (lbs): 86 lbs Visceral Fat Rating : 14   Other Clinical Data Fasting:  No Labs: No Today's Visit #: 14 Starting Date: 03/24/23     ASSESSMENT AND PLAN:  Diet: Sylvia [name] is currently in the action stage of change. As such, her goal is to continue with weight loss efforts. She has agreed to Category 1 Plan.  Exercise: Sylvia [name] has been instructed to work up to a goal of 150 minutes of combined cardio and strengthening exercise per week and to continue exercising as is for weight loss and overall health benefits.   Behavior Modification:  We discussed the following Behavioral Modification Strategies today: increasing lean protein intake, decreasing simple carbohydrates, increasing vegetables, increase H2O intake, increase high fiber foods, no skipping meals, meal planning and cooking strategies, emotional eating strategies , avoiding temptations, and planning  for success. We discussed various medication options to help Sylvia [name] with her weight loss efforts and we both agreed to continue current treatment plan.  Return in about 3 weeks (around 04/13/2024).Sylvia [name] She was informed of the importance of frequent follow up visits to maximize her success with intensive lifestyle modifications for her multiple health conditions.  Attestation Statements:   Reviewed by clinician on day of visit: allergies, medications, problem list, medical history, surgical history, family history, social history, and previous encounter notes.   Time spent on visit including pre-visit chart review and post-visit care and charting was 37 minutes.    Kraven Calk, PA-C

## 2024-03-23 ENCOUNTER — Ambulatory Visit (INDEPENDENT_AMBULATORY_CARE_PROVIDER_SITE_OTHER): Admitting: Physician Assistant

## 2024-03-23 ENCOUNTER — Encounter (INDEPENDENT_AMBULATORY_CARE_PROVIDER_SITE_OTHER): Payer: Self-pay | Admitting: Physician Assistant

## 2024-03-23 VITALS — BP 135/76 | HR 79 | Temp 98.9°F | Ht 64.0 in | Wt 233.0 lb

## 2024-03-23 DIAGNOSIS — Z7984 Long term (current) use of oral hypoglycemic drugs: Secondary | ICD-10-CM

## 2024-03-23 DIAGNOSIS — E119 Type 2 diabetes mellitus without complications: Secondary | ICD-10-CM | POA: Diagnosis not present

## 2024-03-23 DIAGNOSIS — E559 Vitamin D deficiency, unspecified: Secondary | ICD-10-CM | POA: Diagnosis not present

## 2024-03-23 DIAGNOSIS — F39 Unspecified mood [affective] disorder: Secondary | ICD-10-CM

## 2024-03-23 DIAGNOSIS — F439 Reaction to severe stress, unspecified: Secondary | ICD-10-CM

## 2024-03-23 DIAGNOSIS — E1159 Type 2 diabetes mellitus with other circulatory complications: Secondary | ICD-10-CM | POA: Diagnosis not present

## 2024-03-23 DIAGNOSIS — I152 Hypertension secondary to endocrine disorders: Secondary | ICD-10-CM

## 2024-03-23 DIAGNOSIS — Z6841 Body Mass Index (BMI) 40.0 and over, adult: Secondary | ICD-10-CM

## 2024-03-23 DIAGNOSIS — E669 Obesity, unspecified: Secondary | ICD-10-CM

## 2024-03-23 MED ORDER — VITAMIN D (ERGOCALCIFEROL) 1.25 MG (50000 UNIT) PO CAPS
50000.0000 [IU] | ORAL_CAPSULE | ORAL | 0 refills | Status: DC
Start: 2024-03-23 — End: 2024-04-14

## 2024-03-23 MED ORDER — METFORMIN HCL 500 MG PO TABS: 1000.0000 mg | ORAL_TABLET | Freq: Two times a day (BID) | ORAL | 1 refills | Status: DC

## 2024-03-24 DIAGNOSIS — J3089 Other allergic rhinitis: Secondary | ICD-10-CM | POA: Diagnosis not present

## 2024-03-24 DIAGNOSIS — J301 Allergic rhinitis due to pollen: Secondary | ICD-10-CM | POA: Diagnosis not present

## 2024-03-24 DIAGNOSIS — J3081 Allergic rhinitis due to animal (cat) (dog) hair and dander: Secondary | ICD-10-CM | POA: Diagnosis not present

## 2024-03-28 DIAGNOSIS — I10 Essential (primary) hypertension: Secondary | ICD-10-CM | POA: Diagnosis not present

## 2024-03-28 DIAGNOSIS — H938X2 Other specified disorders of left ear: Secondary | ICD-10-CM | POA: Diagnosis not present

## 2024-03-28 DIAGNOSIS — R7303 Prediabetes: Secondary | ICD-10-CM | POA: Diagnosis not present

## 2024-03-28 DIAGNOSIS — Z23 Encounter for immunization: Secondary | ICD-10-CM | POA: Diagnosis not present

## 2024-04-05 ENCOUNTER — Telehealth (INDEPENDENT_AMBULATORY_CARE_PROVIDER_SITE_OTHER): Admitting: Psychology

## 2024-04-13 ENCOUNTER — Other Ambulatory Visit (INDEPENDENT_AMBULATORY_CARE_PROVIDER_SITE_OTHER): Payer: Self-pay | Admitting: Physician Assistant

## 2024-04-13 ENCOUNTER — Ambulatory Visit (INDEPENDENT_AMBULATORY_CARE_PROVIDER_SITE_OTHER): Admitting: Physician Assistant

## 2024-04-13 DIAGNOSIS — E559 Vitamin D deficiency, unspecified: Secondary | ICD-10-CM

## 2024-04-14 DIAGNOSIS — J301 Allergic rhinitis due to pollen: Secondary | ICD-10-CM | POA: Diagnosis not present

## 2024-04-14 DIAGNOSIS — J3089 Other allergic rhinitis: Secondary | ICD-10-CM | POA: Diagnosis not present

## 2024-04-14 DIAGNOSIS — J3081 Allergic rhinitis due to animal (cat) (dog) hair and dander: Secondary | ICD-10-CM | POA: Diagnosis not present

## 2024-04-20 ENCOUNTER — Ambulatory Visit: Admitting: Pulmonary Disease

## 2024-04-21 DIAGNOSIS — J3089 Other allergic rhinitis: Secondary | ICD-10-CM | POA: Diagnosis not present

## 2024-04-21 DIAGNOSIS — J301 Allergic rhinitis due to pollen: Secondary | ICD-10-CM | POA: Diagnosis not present

## 2024-04-21 DIAGNOSIS — J3081 Allergic rhinitis due to animal (cat) (dog) hair and dander: Secondary | ICD-10-CM | POA: Diagnosis not present

## 2024-04-26 NOTE — Progress Notes (Unsigned)
 SUBJECTIVE: Discussed the use of AI scribe software for clinical note transcription with the patient, who gave verbal consent to proceed.  Chief Complaint: Obesity  Interim History: She is down 5 lbs since her last visit.  Down 26 lbs overall TBW Loss of 10.2 %  Sylvia [name] is here to discuss her progress with her obesity treatment plan. She is on the Category 1 Plan and states she is following her eating plan approximately 50 % of the time. She states she is exercising treadmill 15 minutes 2 times per week.  Sylvia [name] is a 53 year old female with obesity who presents for follow-up of her obesity treatment plan.  She is adhering to her obesity treatment plan approximately fifty percent of the time. Over the past several weeks, she has lost an additional five pounds, totaling a weight loss of twenty-six pounds.  She is incorporating more whole foods, such as fruits and vegetables, and consistently meeting her protein intake goals. She maintains adequate hydration and does not skip meals. However, she is not consistently achieving seven to nine hours of sleep per night. For physical activity, she walks on the treadmill for fifteen minutes twice a week.  She has a history of type 2 diabetes and is currently taking metformin  1000 mg twice daily, which was increased at her last visit. She reports taking two tablets at breakfast and two at dinner and has not experienced any gastrointestinal upset. She is mindful of not taking medication on an empty stomach. For her vitamin D  deficiency, she is on ergocalciferol  50,000 units once weekly. She is also on amlodipine  5 mg daily for hypertension management. She is not currently on a statin for hyperlipidemia.  She reports significant stress due to personal and family issues, including her sister-in-law's recent breast cancer diagnosis and her mother-in-law's upcoming procedure. She is managing work-related stress by setting boundaries, such as logging  off work at a set time and not checking emails after hours. Despite these stressors, she is making efforts to maintain a healthy lifestyle, including meal prepping and cooking at home, and has been avoiding sweets at family gatherings, reserving them for special occasions like Thanksgiving and Christmas.  OBJECTIVE: Visit Diagnoses: Problem List Items Addressed This Visit     New onset type 2 diabetes mellitus (HCC) - Primary   Relevant Medications   metFORMIN  (GLUCOPHAGE ) 500 MG tablet   Hypertension associated with diabetes (HCC)   Relevant Medications   metFORMIN  (GLUCOPHAGE ) 500 MG tablet   Vitamin D  deficiency - new onset   Relevant Medications   Vitamin D , Ergocalciferol , (DRISDOL ) 1.25 MG (50000 UNIT) CAPS capsule   Obesity (HCC)- Start BMI 43.6 Date 03/24/2023   Relevant Medications   metFORMIN  (GLUCOPHAGE ) 500 MG tablet   Other Visit Diagnoses       Hyperlipidemia associated with type 2 diabetes mellitus (HCC)       Relevant Medications   metFORMIN  (GLUCOPHAGE ) 500 MG tablet     Stress         BMI 39.0-39.9,adult Current BMI 39.2         Obesity She has lost a total of twenty-six pounds, adhering to a category one plan approximately fifty percent of the time, consuming more whole foods, and achieving recommended protein intake. She maintains adequate hydration and engages in treadmill walking for fifteen minutes twice weekly. She is making mindful nutritional choices and balancing life and work stress. - Continue current dietary plan with emphasis on whole foods and adequate protein  intake - Encourage increased physical activity, aiming to extend treadmill walking as tolerated  Type 2 diabetes mellitus She is on metformin  1000 mg twice daily, increased at the last visit, with good tolerance and no gastrointestinal side effects. She is mindful of her diet, focusing on blood sugar control. Lab Results  Component Value Date   HGBA1C 5.4 09/30/2023   HGBA1C 7.0 (H)  03/24/2023   HGBA1C 5.2 02/09/2012   Lab Results  Component Value Date   LDLCALC 122 (H) 09/30/2023   CREATININE 0.70 09/30/2023   INSULIN   Date Value Ref Range Status  03/24/2023 12.0 2.6 - 24.9 uIU/mL Final  ]She is working  on nutrition plan to decrease simple carbohydrates, increase lean proteins and exercise to promote weight loss and improve glycemic control . - Continue metformin  1000 mg twice daily - Send prescription refill to [facility] on [address] Plan to recheck fasting labs and CMET at next visit.  Meds ordered this encounter  Medications   metFORMIN  (GLUCOPHAGE ) 500 MG tablet    Sig: Take 2 tablets (1,000 mg total) by mouth 2 (two) times daily with a meal. May adjust if having GI upset back to previous 500 mg at each meal if needed    Dispense:  120 tablet    Refill:  1   Vitamin D , Ergocalciferol , (DRISDOL ) 1.25 MG (50000 UNIT) CAPS capsule    Sig: Take 1 capsule (50,000 Units total) by mouth every 7 (seven) days.    Dispense:  12 capsule    Refill:  0    Hypertension She is on amlodipine  5 mg daily with well-controlled blood pressure despite significant life stressors. BP Readings from Last 3 Encounters:  04/27/24 127/73  03/23/24 135/76  02/15/24 130/77   Lab Results  Component Value Date   NA 140 09/30/2023   CL 101 09/30/2023   K 3.9 09/30/2023   CO2 22 09/30/2023   BUN 11 09/30/2023   CREATININE 0.70 09/30/2023   EGFR 104 09/30/2023   CALCIUM 9.5 09/30/2023   PHOS 2.2 (L) 02/09/2012   ALBUMIN 4.2 09/30/2023   GLUCOSE 99 09/30/2023   Continue to work on nutrition plan to promote weight loss and improve BP control.  - Continue amlodipine  5 mg daily  Hyperlipidemia She is not on a statin and is focusing on a heart-healthy diet, including no meat meals and foods beneficial for heart health. Last lipids Lab Results  Component Value Date   CHOL 198 09/30/2023   HDL 61 09/30/2023   LDLCALC 122 (H) 09/30/2023   TRIG 84 09/30/2023   CHOLHDL 3.2  09/30/2023   The 89-bzjm ASCVD risk score (Arnett DK, et al., 2019) is: 7.9%   Values used to calculate the score:     Age: 50 years     Clincally relevant sex: Female     Is Non-Hispanic African American: Yes     Diabetic: Yes     Tobacco smoker: No     Systolic Blood Pressure: 127 mmHg     Is BP treated: Yes     HDL Cholesterol: 61 mg/dL     Total Cholesterol: 198 mg/dL Plan: Not currently on statin therapy. She has made significant progress with weight loss and life style changes. Continue to work on nutrition plan -decreasing simple carbohydrates, increasing lean proteins, decreasing saturated fats and cholesterol , avoiding trans fats and exercise as able to promote weight loss, improve lipids and decrease cardiovascular risks. Will plan to recheck fasting lipids next visit and if not  showing adequate improvement, start statin therapy if patient agreeable.   Vitamin D  deficiency She is on ergocalciferol  50,000 units once weekly and adheres to the treatment plan. No N/V or muscle weakness with Ergocalciferol .  Last vitamin D  Lab Results  Component Value Date   VD25OH 49.7 09/30/2023  Low vitamin D  levels can be associated with adiposity and may result in leptin resistance and weight gain. Also associated with fatigue.  Currently on vitamin D  supplementation without any adverse effects such as nausea, vomiting or muscle weakness.  Continue Ergocalciferol  50,000 units once weekly.  Plan to recheck vitamin D  and B levels next visit.  - Send prescription refill [facility] [address]  Stress She experiences significant stress due to personal and professional challenges, including family health issues and work-related stress. She is actively managing stress by setting work boundaries and prioritizing family and personal well-being. - Encourage continued stress management techniques, including setting work boundaries and prioritizing personal time as you are doing.    Vitals Temp: 98.7 F (37.1 C) BP: 127/73 Pulse Rate: 89 SpO2: 100 %   Anthropometric Measurements Height: 5' 4 (1.626 m) Weight: 228 lb (103.4 kg) BMI (Calculated): 39.12 Weight at Last Visit: 233 lb Weight Lost Since Last Visit: 5 lb Weight Gained Since Last Visit: 0 Starting Weight: 254 lb Total Weight Loss (lbs): 26 lb (11.8 kg)   Body Composition  Body Fat %: 47.6 % Fat Mass (lbs): 108.8 lbs Muscle Mass (lbs): 113.6 lbs Total Body Water  (lbs): 84.8 lbs Visceral Fat Rating : 14   Other Clinical Data Fasting: No Labs: Yes Today's Visit #: 15 Starting Date: 03/24/23     ASSESSMENT AND PLAN:  Diet: Montoya is currently in the action stage of change. As such, her goal is to continue with weight loss efforts. She has agreed to Category 1 Plan.  Exercise: Venie has been instructed to work up to a goal of 150 minutes of combined cardio and strengthening exercise per week for weight loss and overall health benefits.   Behavior Modification:  We discussed the following Behavioral Modification Strategies today: increasing lean protein intake, decreasing simple carbohydrates, increasing vegetables, increase H2O intake, increase high fiber foods, meal planning and cooking strategies, emotional eating strategies , avoiding temptations, and planning for success. We discussed various medication options to help Buffy with her weight loss efforts and we both agreed to continue current treatment plan.  Return in about 4 weeks (around 05/25/2024) for Fasting Lab.SABRA She was informed of the importance of frequent follow up visits to maximize her success with intensive lifestyle modifications for her multiple health conditions.  Attestation Statements:   Reviewed by clinician on day of visit: allergies, medications, problem list, medical history, surgical history, family history, social history, and previous encounter notes.   Time spent on visit including pre-visit chart review  and post-visit care and charting was 32 minutes.    Nicolaas Savo, PA-C

## 2024-04-27 ENCOUNTER — Encounter (INDEPENDENT_AMBULATORY_CARE_PROVIDER_SITE_OTHER): Payer: Self-pay | Admitting: Physician Assistant

## 2024-04-27 ENCOUNTER — Ambulatory Visit (INDEPENDENT_AMBULATORY_CARE_PROVIDER_SITE_OTHER): Admitting: Physician Assistant

## 2024-04-27 VITALS — BP 127/73 | HR 89 | Temp 98.7°F | Ht 64.0 in | Wt 228.0 lb

## 2024-04-27 DIAGNOSIS — F39 Unspecified mood [affective] disorder: Secondary | ICD-10-CM

## 2024-04-27 DIAGNOSIS — E669 Obesity, unspecified: Secondary | ICD-10-CM

## 2024-04-27 DIAGNOSIS — E559 Vitamin D deficiency, unspecified: Secondary | ICD-10-CM

## 2024-04-27 DIAGNOSIS — Z7984 Long term (current) use of oral hypoglycemic drugs: Secondary | ICD-10-CM

## 2024-04-27 DIAGNOSIS — E119 Type 2 diabetes mellitus without complications: Secondary | ICD-10-CM | POA: Diagnosis not present

## 2024-04-27 DIAGNOSIS — E1159 Type 2 diabetes mellitus with other circulatory complications: Secondary | ICD-10-CM | POA: Diagnosis not present

## 2024-04-27 DIAGNOSIS — E785 Hyperlipidemia, unspecified: Secondary | ICD-10-CM

## 2024-04-27 DIAGNOSIS — F439 Reaction to severe stress, unspecified: Secondary | ICD-10-CM

## 2024-04-27 DIAGNOSIS — E1169 Type 2 diabetes mellitus with other specified complication: Secondary | ICD-10-CM | POA: Diagnosis not present

## 2024-04-27 DIAGNOSIS — Z6839 Body mass index (BMI) 39.0-39.9, adult: Secondary | ICD-10-CM

## 2024-04-27 DIAGNOSIS — I152 Hypertension secondary to endocrine disorders: Secondary | ICD-10-CM | POA: Diagnosis not present

## 2024-04-27 MED ORDER — METFORMIN HCL 500 MG PO TABS: 1000.0000 mg | ORAL_TABLET | Freq: Two times a day (BID) | ORAL | 1 refills | Status: DC

## 2024-04-27 MED ORDER — VITAMIN D (ERGOCALCIFEROL) 1.25 MG (50000 UNIT) PO CAPS: 50000.0000 [IU] | ORAL_CAPSULE | ORAL | 0 refills | Status: AC

## 2024-04-29 DIAGNOSIS — J3089 Other allergic rhinitis: Secondary | ICD-10-CM | POA: Diagnosis not present

## 2024-04-29 DIAGNOSIS — J301 Allergic rhinitis due to pollen: Secondary | ICD-10-CM | POA: Diagnosis not present

## 2024-04-29 DIAGNOSIS — J3081 Allergic rhinitis due to animal (cat) (dog) hair and dander: Secondary | ICD-10-CM | POA: Diagnosis not present

## 2024-05-12 DIAGNOSIS — J3089 Other allergic rhinitis: Secondary | ICD-10-CM | POA: Diagnosis not present

## 2024-05-12 DIAGNOSIS — J3081 Allergic rhinitis due to animal (cat) (dog) hair and dander: Secondary | ICD-10-CM | POA: Diagnosis not present

## 2024-05-15 NOTE — Progress Notes (Unsigned)
 SUBJECTIVE: Discussed the use of AI scribe software for clinical note transcription with the patient, who gave verbal consent to proceed.  Chief Complaint: Obesity  Interim History: She is down 4 lbs since her last visit.  Down 30 lbs overall TBW loss of 11.8%  Sylvia [name] is here to discuss her progress with her obesity treatment plan. She is on the Category 2 Plan and states she is following her eating plan approximately 75 % of the time. She states she is exercising treadmill 15 minutes 2 times per week. She is managing her weight through mindful eating and exercise, although her exercise routine has decreased due to a busy schedule. She uses the treadmill twice a week and incorporates boiled eggs, seed toast, fruit, and low-calorie snacks into her meals. She has lost 30 pounds and has exceeded her goal weight of 225 pounds by December 31st.  Her current medications include metformin  1000 mg twice a day, taken with meals without any stomach upset, and vitamin D  once a week. She reports no issues with these medications.  Her energy levels are generally good but could improve with more sleep. She is working on getting to bed earlier and improving her sleep routine. Stress remains high due to multiple concerns with a busy lifestyle with family responsibilities, including caring for her daughter Sylvia [name], who has health and school commitments, and supporting her sister-in-law who is starting chemotherapy. She also mentions other recent family events, such as her best friend's father's arrangements following his passing and her mother and mother-in-law's procedures.  Fasting labs obtained The patient was informed we would discuss the lab results at the next visit unless there is a critical issue that needs to be addressed sooner. The patient agreed to keep the next visit at the agreed upon time to discuss these results.   OBJECTIVE: Visit Diagnoses: Problem List Items Addressed This Visit     New  onset type 2 diabetes mellitus (HCC) - Primary   Relevant Medications   metFORMIN  (GLUCOPHAGE ) 500 MG tablet   Other Relevant Orders   CMP14+EGFR   Hemoglobin A1c   Insulin , random   Vitamin D  deficiency - new onset   Relevant Orders   VITAMIN D  25 Hydroxy (Vit-D Deficiency, Fractures)   Obesity (HCC)- Start BMI 43.6 Date 03/24/2023   Relevant Medications   metFORMIN  (GLUCOPHAGE ) 500 MG tablet   Other Visit Diagnoses       Hyperlipidemia associated with type 2 diabetes mellitus (HCC)       Relevant Medications   metFORMIN  (GLUCOPHAGE ) 500 MG tablet   Other Relevant Orders   Lipid Panel With LDL/HDL Ratio     Other fatigue       Relevant Orders   Vitamin B12   TSH     BMI 38.0-38.9,adult Current BMI 38.6         Obesity with emotional eating behavior Obesity management is progressing well with a 30-pound weight loss. Emotional eating behavior is being addressed through mindful eating and portion control strategies. She is engaging in regular physical activity, including treadmill use twice a week, and has developed a sustainable eating pattern that includes healthy snacks and balanced meals. She is mindful of her eating habits, and we discussed strategies during the holidays, and is working on improving sleep to support overall health. - Continue current dietary and exercise regimen. - Encouraged mindful eating and portion control strategies. - Scheduled follow-up appointment for January 6th, 2026. - follow up labs obtained today.  Type 2  diabetes Managed with metformin  1000 mg twice daily. She reports no gastrointestinal side effects and adheres to the medication regimen. Recent weight loss and dietary changes are expected to positively impact glycemic control. Lab Results  Component Value Date   HGBA1C 5.4 09/30/2023   HGBA1C 7.0 (H) 03/24/2023   HGBA1C 5.2 02/09/2012   Lab Results  Component Value Date   LDLCALC 122 (H) 09/30/2023   CREATININE 0.70 09/30/2023    INSULIN   Date Value Ref Range Status  03/24/2023 12.0 2.6 - 24.9 uIU/mL Final  -She is working  on nutrition plan to decrease simple carbohydrates, increase lean proteins and exercise to promote weight loss and improve glycemic control . - Continue/refill metformin  1000 mg twice daily. - Ordered A1c and insulin  level tests. Meds ordered this encounter  Medications   metFORMIN  (GLUCOPHAGE ) 500 MG tablet    Sig: Take 2 tablets (1,000 mg total) by mouth 2 (two) times daily with a meal. May adjust if having GI upset back to previous 500 mg at each meal if needed    Dispense:  120 tablet    Refill:  1    Hypercholesterolemia Monitored as part of the overall management of metabolic health. Recent weight loss and dietary changes may positively impact lipid levels. Lab Results  Component Value Date   CHOL 198 09/30/2023   CHOL 198 03/24/2023   Lab Results  Component Value Date   HDL 61 09/30/2023   HDL 62 03/24/2023   Lab Results  Component Value Date   LDLCALC 122 (H) 09/30/2023   LDLCALC 111 (H) 03/24/2023   Lab Results  Component Value Date   TRIG 84 09/30/2023   TRIG 141 03/24/2023   Lab Results  Component Value Date   CHOLHDL 3.2 09/30/2023   No results found for: LDLDIRECT The 10-year ASCVD risk score (Arnett DK, et al., 2019) is: 8.2%   Values used to calculate the score:     Age: 53 years     Clincally relevant sex: Female     Is Non-Hispanic African American: Yes     Diabetic: Yes     Tobacco smoker: No     Systolic Blood Pressure: 128 mmHg     Is BP treated: Yes     HDL Cholesterol: 61 mg/dL     Total Cholesterol: 198 mg/dL Continue to work on nutrition plan -decreasing simple carbohydrates, increasing lean proteins, decreasing saturated fats and cholesterol , avoiding trans fats and exercise as able to promote weight loss, improve lipids and decrease cardiovascular risks. May want to consider low dose statin therapy. Will see if labs showing improvement  in LDL.  - Ordered lipid panel.  Vitamin D  deficiency Managed with weekly supplementation Ergocalciferol  50,000 units once weekly. No N/V or muscle weakness with Ergocalciferol . She adheres to the regimen and has not reported any issues. Last vitamin D  Lab Results  Component Value Date   VD25OH 49.7 09/30/2023   Low vitamin D  levels can be associated with adiposity and may result in leptin resistance and weight gain. Also associated with fatigue.  Currently on vitamin D  supplementation without any adverse effects such as nausea, vomiting or muscle weakness.  No refill needed this visit.  - Continue weekly vitamin D  supplementation- Ergocalciferol  50,000 units once weekly. - Ordered vitamin D  level test.   Fatigue Energy levels overall improved.  Plan: Recheck B 12, vitamin D  and TSH levels and address if indicated.    Vitals Temp: 98.7 F (37.1 C) BP: 128/79 Pulse  Rate: 79 SpO2: 99 %   Anthropometric Measurements Height: 5' 4 (1.626 m) Weight: 224 lb (101.6 kg) BMI (Calculated): 38.43 Weight at Last Visit: 228 lb Weight Lost Since Last Visit: 4 lb Weight Gained Since Last Visit: 0 Starting Weight: 254 lb Total Weight Loss (lbs): 30 lb (13.6 kg)   Body Composition  Body Fat %: 45.3 % Fat Mass (lbs): 101.8 lbs Muscle Mass (lbs): 116.6 lbs Total Body Water  (lbs): 80 lbs Visceral Fat Rating : 13   Other Clinical Data Fasting: Yes Labs: Yes Today's Visit #: 16 Starting Date: 03/24/23     ASSESSMENT AND PLAN:  Diet: Astaria is currently in the action stage of change. As such, her goal is to continue with weight loss efforts. She has agreed to Category 2 Plan.  Exercise: Sangita has been instructed to work up to a goal of 150 minutes of combined cardio and strengthening exercise per week for weight loss and overall health benefits.   Behavior Modification:  We discussed the following Behavioral Modification Strategies today: increasing lean protein intake,  decreasing simple carbohydrates, increasing vegetables, increase H2O intake, increase high fiber foods, meal planning and cooking strategies, holiday eating strategies, avoiding temptations, and planning for success. We discussed various medication options to help Olene with her weight loss efforts and we both agreed to continue current treatment plan.  Return in about 8 weeks (around 07/11/2024).Sylvia [name] She was informed of the importance of frequent follow up visits to maximize her success with intensive lifestyle modifications for her multiple health conditions.  Attestation Statements:   Reviewed by clinician on day of visit: allergies, medications, problem list, medical history, surgical history, family history, social history, and previous encounter notes.   Time spent on visit including pre-visit chart review and post-visit care and charting was 28 minutes.    Sylvia Nierenberg, Sylvia [name]

## 2024-05-16 ENCOUNTER — Ambulatory Visit: Admitting: Pulmonary Disease

## 2024-05-16 ENCOUNTER — Encounter (INDEPENDENT_AMBULATORY_CARE_PROVIDER_SITE_OTHER): Payer: Self-pay | Admitting: Physician Assistant

## 2024-05-16 ENCOUNTER — Encounter: Payer: Self-pay | Admitting: Pulmonary Disease

## 2024-05-16 ENCOUNTER — Ambulatory Visit (INDEPENDENT_AMBULATORY_CARE_PROVIDER_SITE_OTHER): Payer: Self-pay | Admitting: Physician Assistant

## 2024-05-16 VITALS — BP 136/72 | HR 94 | Temp 98.1°F | Ht 64.0 in | Wt 227.0 lb

## 2024-05-16 VITALS — BP 128/79 | HR 79 | Temp 98.7°F | Ht 64.0 in | Wt 224.0 lb

## 2024-05-16 DIAGNOSIS — R5383 Other fatigue: Secondary | ICD-10-CM

## 2024-05-16 DIAGNOSIS — E785 Hyperlipidemia, unspecified: Secondary | ICD-10-CM | POA: Diagnosis not present

## 2024-05-16 DIAGNOSIS — F5089 Other specified eating disorder: Secondary | ICD-10-CM | POA: Diagnosis not present

## 2024-05-16 DIAGNOSIS — I152 Hypertension secondary to endocrine disorders: Secondary | ICD-10-CM

## 2024-05-16 DIAGNOSIS — E119 Type 2 diabetes mellitus without complications: Secondary | ICD-10-CM | POA: Diagnosis not present

## 2024-05-16 DIAGNOSIS — J309 Allergic rhinitis, unspecified: Secondary | ICD-10-CM

## 2024-05-16 DIAGNOSIS — E1169 Type 2 diabetes mellitus with other specified complication: Secondary | ICD-10-CM | POA: Diagnosis not present

## 2024-05-16 DIAGNOSIS — J4550 Severe persistent asthma, uncomplicated: Secondary | ICD-10-CM

## 2024-05-16 DIAGNOSIS — Z6838 Body mass index (BMI) 38.0-38.9, adult: Secondary | ICD-10-CM

## 2024-05-16 DIAGNOSIS — R0683 Snoring: Secondary | ICD-10-CM

## 2024-05-16 DIAGNOSIS — E669 Obesity, unspecified: Secondary | ICD-10-CM

## 2024-05-16 DIAGNOSIS — E559 Vitamin D deficiency, unspecified: Secondary | ICD-10-CM | POA: Diagnosis not present

## 2024-05-16 DIAGNOSIS — Z7984 Long term (current) use of oral hypoglycemic drugs: Secondary | ICD-10-CM

## 2024-05-16 MED ORDER — METFORMIN HCL 500 MG PO TABS: 1000.0000 mg | ORAL_TABLET | Freq: Two times a day (BID) | ORAL | 1 refills | Status: AC

## 2024-05-16 NOTE — Progress Notes (Signed)
 Synopsis: Referred in by Cleotilde Planas, MD   Subjective:   PATIENT ID: Sylvia [name] GENDER: female DOB: 1971/06/02, MRN: [id number]  Chief Complaint  Patient presents with   Asthma    No SOB. Mild chest tightness. No wheezing or cough.  Dupixent . Trelegy- daily helps. Airsupra - PRN. Levalbuterol - PRN. Albuterol  Neb- PRN    HPI Sylvia [name] is a pleasant 53 year old female patient with a past medical history of severe persistent asthma on Trelegy presenting to the pulmonary office for worsening asthma control.  She reports that she was diagnosed with asthma at a young age and was hospitalized multiple times in the past but never required intubation.  Last being 6 to 7 years ago.  About a month prior to presentation she started having shortness of breath chest tightness on a daily basis and had to use her rescue inhaler multiple times a day that was associated with coughing spells at night with wheezing.  She went to the urgent care clinic in July and was prescribed prednisone .  It did help however she was never back to baseline.  She went to her allergist Dr. Frutoso who prescribed another round of prednisone  with same response.  She denies any heartburn denies any acid taste in the mouth.  She does snore at night per her husband and feels fatigued during the day.  She denies any weight loss loss of appetite night sweats. She has a history of ectopic dermatitis and allergic rhinitis.    In the interim, she underwent PFTs that showed mild restriction with normal DLCO. Her Eosinophil count was 200 and IgE total count was a 1000. Antigen panel was + for multiple antigens highest for Dog Dander. She also underwent a sleep study on 09/18 with moderate sleep apnea AHI 15. Currently on Trelegy 200, Airsupra  as needed and dupixent .    Started on dupixent  04/2023 and CPAP 04/2023.   Today reports feeling better. using rescue inhaler very rarely once a week to once every 2 weeks. Continues to  exercise regularly and lose weight.   Family history: Daughter with asthma   Social history: Never smoker, denies alcohol use or illicit drug use. Worked as air cabin crew. Does not have any pets at home.   OV 05/20/2024 - [name] is here to follow up regarding her asthma. She is doing overall well. She continues to lose weight and was wondering if her CPAP is still needed. ACT score today is 22.   ROS All systems were reviewed and are negative except for the above.  Objective:   Vitals:   05/16/24 1541  BP: 136/72  Pulse: 94  Temp: 98.1 F (36.7 C)  SpO2: 100%  Weight: 227 lb (103 kg)  Height: 5' 4 (1.626 m)    100% on RA BMI Readings from Last 3 Encounters:  05/16/24 38.96 kg/m  05/16/24 38.45 kg/m  04/27/24 39.14 kg/m   Wt Readings from Last 3 Encounters:  05/16/24 227 lb (103 kg)  05/16/24 224 lb (101.6 kg)  04/27/24 228 lb (103.4 kg)    Physical Exam GEN: NAD, Healthy Appearing HEENT: Supple Neck, Reactive Pupils, possible nasal polyp seen. CVS: Normal S1, Normal S2, RRR, No murmurs or ES appreciated  Lungs: Faint expiratory wheezing.  Abdomen: Soft, non tender, non distended, + BS  Extremities: Warm and well perfused, No edema  Skin: No suspicious lesions appreciated  Psych: Normal Affect  Ancillary Information   CBC    Component Value Date/Time   WBC 10.4  02/17/2023 0937   RBC 4.60 02/17/2023 0937   HGB 13.8 02/17/2023 0937   HCT 43.2 02/17/2023 0937   PLT 354 02/17/2023 0937   MCV 93.9 02/17/2023 0937   MCH 30.0 02/17/2023 0937   MCHC 31.9 02/17/2023 0937   RDW 13.5 02/17/2023 0937   LYMPHSABS 2.4 02/17/2023 0937   MONOABS 0.9 02/17/2023 0937   EOSABS 0.2 02/17/2023 0937   BASOSABS 0.1 02/17/2023 0937    Imaging  CXR 01/15/2023: No active cardiopulmonary disease  CTA Chest 01/22/2023: No acute intrathoracic findings.    Echocardiogram 03/06/23  1. Left ventricular ejection fraction, by estimation, is 60 to 65%. The  left  ventricle has normal function. The left ventricle has no regional  wall motion abnormalities. Left ventricular diastolic parameters were  normal.   2. Right ventricular systolic function is normal. The right ventricular  size is normal. There is normal pulmonary artery systolic pressure. The  estimated right ventricular systolic pressure is 19.1 mmHg.   3. The mitral valve is normal in structure. Mild mitral valve  regurgitation. No evidence of mitral stenosis.   4. The aortic valve is normal in structure. Aortic valve regurgitation is  not visualized. Aortic valve sclerosis is present, with no evidence of  aortic valve stenosis.   5. The inferior vena cava is normal in size with greater than 50%  respiratory variability, suggesting right atrial pressure of 3 mmHg.     Latest Ref Rng & Units 03/12/2023    8:13 AM  PFT Results  FVC-Pre L 2.50   FVC-Predicted Pre % 71   FVC-Post L 2.32   FVC-Predicted Post % 65   Pre FEV1/FVC % % 84   Post FEV1/FCV % % 87   FEV1-Pre L 2.09   FEV1-Predicted Pre % 75   FEV1-Post L 2.01   DLCO uncorrected ml/min/mmHg 17.37   DLCO UNC% % 82   DLVA Predicted % 118   TLC L 4.01   TLC % Predicted % 79   RV % Predicted % 90      Assessment & Plan:  Sylvia [name] is a pleasant 53 year old female patient with a past medical history of severe persistent asthma on Trelegy presenting to the pulmonary office for worsening asthma control.  #Severe Persistent Asthma well controlled ACT 23 IgE 1000.  Eos 200 FENO 14 indicating well controlled asthma with improved airway inflammation.  []  continue with Fluticasone -Umeclidinium-Vilanterol [Trelegy] 200-62.5-25mcg 1 puff daily.  []  c/w with Albuterol -budesonide  [airsupra ] 90-80mcg 2puffs Q6H PRN for wheezing/sob/chest tightness  []  c w/ Dupixent  Therapy (Started 04/2023) []  c/w good house care  inlcuding airpurifiers and dehumidifier.  []  C/w weightloss and healthy lifestyle.   #Allergic rhinitis with  possible polyps  []  c/w Flonase  ns 1 spray in each nares for 1 month  []  Avoid NSAIDs  []  Dupixent  as above.   #Moderate OSA (AHI 14) Per husband she does snore at night and has some apneic episodes. She is high risk for OSA. Will adjust her CPAP machine to auto 4-12 and assess need for an adequate AHI.   []  Conitnue with CPAP auto 4-12.    RTC 6 months.  I personally spent a total of 30 minutes in the care of the patient today including preparing to see the patient, getting/reviewing separately obtained history, performing a medically appropriate exam/evaluation, counseling and educating, documenting clinical information in the EHR, independently interpreting results, and communicating results.   Darrin Barn, MD [facility] 06/12/2024 5:09 PM

## 2024-05-16 NOTE — Progress Notes (Signed)
 Synopsis: Referred in by Cleotilde Planas, MD   Subjective:   PATIENT ID: Sylvia [name] GENDER: female DOB: 04/23/1971, MRN: [id number]  Chief Complaint  Patient presents with   Asthma    No SOB. Mild chest tightness. No wheezing or cough.  Dupixent . Trelegy- daily helps. Airsupra - PRN. Levalbuterol - PRN. Albuterol  Neb- PRN    HPI Sylvia [name] is a pleasant 53 year old female patient with a past medical history of severe persistent asthma on Trelegy presenting to the pulmonary office for worsening asthma control.  She reports that she was diagnosed with asthma at a young age and was hospitalized multiple times in the past but never required intubation.  Last being 6 to 7 years ago.  About a month prior to presentation she started having shortness of breath chest tightness on a daily basis and had to use her rescue inhaler multiple times a day that was associated with coughing spells at night with wheezing.  She went to the urgent care clinic in July and was prescribed prednisone .  It did help however she was never back to baseline.  She went to her allergist Dr. Frutoso who prescribed another round of prednisone  with same response.  She denies any heartburn denies any acid taste in the mouth.  She does snore at night per her husband and feels fatigued during the day.  She denies any weight loss loss of appetite night sweats. She has a history of ectopic dermatitis and allergic rhinitis.    In the interim, she underwent PFTs that showed mild restriction with normal DLCO. Her Eosinophil count was 200 and IgE total count was a 1000. Antigen panel was + for multiple antigens highest for Dog Dander. She also underwent a sleep study on 09/18 with moderate sleep apnea AHI 15. Currently on Trelegy 200, Airsupra  as needed and dupixent .    Started on dupixent  04/2023 and CPAP 04/2023.   Today reports feeling better. using rescue inhaler very rarely once a week to once every 2 weeks. Continues to  exercise regularly and lose weight.   Family history: Daughter with asthma   Social history: Never smoker, denies alcohol use or illicit drug use. Worked as air cabin crew. Does not have any pets at home.   ROS All systems were reviewed and are negative except for the above.  Objective:   Vitals:   05/16/24 1541  BP: 136/72  Pulse: 94  Temp: 98.1 F (36.7 C)  SpO2: 100%  Weight: 227 lb (103 kg)  Height: 5' 4 (1.626 m)    100% on RA BMI Readings from Last 3 Encounters:  05/16/24 38.96 kg/m  05/16/24 38.45 kg/m  04/27/24 39.14 kg/m   Wt Readings from Last 3 Encounters:  05/16/24 227 lb (103 kg)  05/16/24 224 lb (101.6 kg)  04/27/24 228 lb (103.4 kg)    Physical Exam GEN: NAD, Healthy Appearing HEENT: Supple Neck, Reactive Pupils, possible nasal polyp seen. CVS: Normal S1, Normal S2, RRR, No murmurs or ES appreciated  Lungs: Faint expiratory wheezing.  Abdomen: Soft, non tender, non distended, + BS  Extremities: Warm and well perfused, No edema  Skin: No suspicious lesions appreciated  Psych: Normal Affect  Ancillary Information   CBC    Component Value Date/Time   WBC 10.4 02/17/2023 0937   RBC 4.60 02/17/2023 0937   HGB 13.8 02/17/2023 0937   HCT 43.2 02/17/2023 0937   PLT 354 02/17/2023 0937   MCV 93.9 02/17/2023 0937   MCH 30.0 02/17/2023 0937  MCHC 31.9 02/17/2023 0937   RDW 13.5 02/17/2023 0937   LYMPHSABS 2.4 02/17/2023 0937   MONOABS 0.9 02/17/2023 0937   EOSABS 0.2 02/17/2023 0937   BASOSABS 0.1 02/17/2023 0937    Imaging  CXR 01/15/2023: No active cardiopulmonary disease  CTA Chest 01/22/2023: No acute intrathoracic findings.    Echocardiogram 03/06/23  1. Left ventricular ejection fraction, by estimation, is 60 to 65%. The  left ventricle has normal function. The left ventricle has no regional  wall motion abnormalities. Left ventricular diastolic parameters were  normal.   2. Right ventricular systolic function is normal.  The right ventricular  size is normal. There is normal pulmonary artery systolic pressure. The  estimated right ventricular systolic pressure is 19.1 mmHg.   3. The mitral valve is normal in structure. Mild mitral valve  regurgitation. No evidence of mitral stenosis.   4. The aortic valve is normal in structure. Aortic valve regurgitation is  not visualized. Aortic valve sclerosis is present, with no evidence of  aortic valve stenosis.   5. The inferior vena cava is normal in size with greater than 50%  respiratory variability, suggesting right atrial pressure of 3 mmHg.     Latest Ref Rng & Units 03/12/2023    8:13 AM  PFT Results  FVC-Pre L 2.50   FVC-Predicted Pre % 71   FVC-Post L 2.32   FVC-Predicted Post % 65   Pre FEV1/FVC % % 84   Post FEV1/FCV % % 87   FEV1-Pre L 2.09   FEV1-Predicted Pre % 75   FEV1-Post L 2.01   DLCO uncorrected ml/min/mmHg 17.37   DLCO UNC% % 82   DLVA Predicted % 118   TLC L 4.01   TLC % Predicted % 79   RV % Predicted % 90      Assessment & Plan:  Sylvia [name] is a pleasant 53 year old female patient with a past medical history of severe persistent asthma on Trelegy presenting to the pulmonary office for worsening asthma control.  #Severe Persistent Asthma well controlled ACT 23 IgE 1000.  Eos 200 FENO 14 indicating well controlled asthma with improved airway inflammation.  []  continue with Fluticasone -Umeclidinium-Vilanterol [Trelegy] 200-62.5-25mcg 1 puff daily.  []  c/w with Albuterol -budesonide  [airsupra ] 90-80mcg 2puffs Q6H PRN for wheezing/sob/chest tightness  []  c w/ Dupixent  Therapy (Started 04/2023) []  c/w good house care  inlcuding airpurifiers and dehumidifier.  []  C/w weightloss and healthy lifestyle.   #Allergic rhinitis with possible polyps  []  c/w Flonase  ns 1 spray in each nares for 1 month  []  Avoid NSAIDs  []  Dupixent  as above.   #Moderate OSA (AHI 14) Per husband she does snore at night and has some apneic  episodes. She is high risk for OSA.   []  Conitnue with CPAP 8.    RTC 6 months.  I spent 30 minutes caring for this patient today, including preparing to see the patient, obtaining a medical history , reviewing a separately obtained history, performing a medically appropriate examination and/or evaluation, counseling and educating the patient/family/caregiver, ordering medications, tests, or procedures, documenting clinical information in the electronic health record, and independently interpreting results (not separately reported/billed) and communicating results to the patient/family/caregiver  Darrin Barn, MD [facility] 05/16/2024 4:11 PM

## 2024-05-17 ENCOUNTER — Ambulatory Visit (INDEPENDENT_AMBULATORY_CARE_PROVIDER_SITE_OTHER): Payer: Self-pay | Admitting: Physician Assistant

## 2024-05-18 LAB — CMP14+EGFR
ALT: 11 IU/L (ref 0–32)
AST: 15 IU/L (ref 0–40)
Albumin: 4.2 g/dL (ref 3.8–4.9)
Alkaline Phosphatase: 85 IU/L (ref 49–135)
BUN/Creatinine Ratio: 16 (ref 9–23)
BUN: 11 mg/dL (ref 6–24)
Bilirubin Total: 0.4 mg/dL (ref 0.0–1.2)
CO2: 22 mmol/L (ref 20–29)
Calcium: 9.5 mg/dL (ref 8.7–10.2)
Chloride: 103 mmol/L (ref 96–106)
Creatinine, Ser: 0.69 mg/dL (ref 0.57–1.00)
Globulin, Total: 2.6 g/dL (ref 1.5–4.5)
Glucose: 89 mg/dL (ref 70–99)
Potassium: 3.8 mmol/L (ref 3.5–5.2)
Sodium: 142 mmol/L (ref 134–144)
Total Protein: 6.8 g/dL (ref 6.0–8.5)
eGFR: 104 mL/min/1.73 (ref >59)

## 2024-05-18 LAB — VITAMIN D 25 HYDROXY (VIT D DEFICIENCY, FRACTURES): Vit D, 25-Hydroxy: 47.2 ng/mL (ref 30.0–100.0)

## 2024-05-18 LAB — VITAMIN B12: Vitamin B-12: 493 pg/mL (ref 232–1245)

## 2024-05-18 LAB — HEMOGLOBIN A1C
Est. average glucose Bld gHb Est-mCnc: 100 mg/dL
Hgb A1c MFr Bld: 5.1 % (ref 4.8–5.6)

## 2024-05-18 LAB — TSH: TSH: 2.93 u[IU]/mL (ref 0.450–4.500)

## 2024-05-18 LAB — LIPID PANEL WITH LDL/HDL RATIO
Cholesterol, Total: 180 mg/dL (ref 100–199)
HDL: 63 mg/dL (ref >39)
LDL Chol Calc (NIH): 101 mg/dL — ABNORMAL HIGH (ref 0–99)
LDL/HDL Ratio: 1.6 ratio (ref 0.0–3.2)
Triglycerides: 89 mg/dL (ref 0–149)
VLDL Cholesterol Cal: 16 mg/dL (ref 5–40)

## 2024-05-18 LAB — INSULIN, RANDOM: INSULIN: 10.1 u[IU]/mL (ref 2.6–24.9)

## 2024-05-20 DIAGNOSIS — J3089 Other allergic rhinitis: Secondary | ICD-10-CM | POA: Diagnosis not present

## 2024-05-20 DIAGNOSIS — J3081 Allergic rhinitis due to animal (cat) (dog) hair and dander: Secondary | ICD-10-CM | POA: Diagnosis not present

## 2024-05-20 DIAGNOSIS — J301 Allergic rhinitis due to pollen: Secondary | ICD-10-CM | POA: Diagnosis not present

## 2024-05-25 DIAGNOSIS — U071 COVID-19: Secondary | ICD-10-CM | POA: Diagnosis not present

## 2024-05-25 DIAGNOSIS — R0981 Nasal congestion: Secondary | ICD-10-CM | POA: Diagnosis not present

## 2024-05-25 DIAGNOSIS — R051 Acute cough: Secondary | ICD-10-CM | POA: Diagnosis not present

## 2024-05-25 DIAGNOSIS — R0982 Postnasal drip: Secondary | ICD-10-CM | POA: Diagnosis not present

## 2024-05-30 ENCOUNTER — Encounter: Payer: Self-pay | Admitting: Pulmonary Disease

## 2024-05-30 ENCOUNTER — Telehealth: Payer: Self-pay

## 2024-05-30 NOTE — Telephone Encounter (Signed)
 Copied from CRM #[id number]. Topic: Clinical - Medical Advice >> May 30, 2024  3:23 PM Lavanda D wrote: Reason for CRM: Patient is calling to advise that she has sent a message on MyChart regarding an update for Dr. DELENA on her symptoms. She advised that she has tested positive for Covid as of 11/19 + Her symptoms per her message on MyChart. Confirmed that patient is not currently experiencing any pain/trouble breathing as of right now. She states the sinus pain/trouble breathing typically happens at night. She would like to confirm if she should continue with her albuterol  before she goes to sleep + if she should go back on her CPAP? She stated that she was previously advised by Dr. DELENA to stop using her CPAP since she had been doing very well recently. she said the COVID came on after stopping her CPAP.

## 2024-06-09 DIAGNOSIS — Z8709 Personal history of other diseases of the respiratory system: Secondary | ICD-10-CM | POA: Diagnosis not present

## 2024-06-09 DIAGNOSIS — J209 Acute bronchitis, unspecified: Secondary | ICD-10-CM | POA: Diagnosis not present

## 2024-06-09 DIAGNOSIS — J329 Chronic sinusitis, unspecified: Secondary | ICD-10-CM | POA: Diagnosis not present

## 2024-06-14 ENCOUNTER — Other Ambulatory Visit: Payer: Self-pay | Admitting: Pulmonary Disease

## 2024-06-14 DIAGNOSIS — J4550 Severe persistent asthma, uncomplicated: Secondary | ICD-10-CM

## 2024-06-17 DIAGNOSIS — J301 Allergic rhinitis due to pollen: Secondary | ICD-10-CM | POA: Diagnosis not present

## 2024-06-17 DIAGNOSIS — J3081 Allergic rhinitis due to animal (cat) (dog) hair and dander: Secondary | ICD-10-CM | POA: Diagnosis not present

## 2024-06-17 DIAGNOSIS — J3089 Other allergic rhinitis: Secondary | ICD-10-CM | POA: Diagnosis not present

## 2024-07-12 ENCOUNTER — Ambulatory Visit (INDEPENDENT_AMBULATORY_CARE_PROVIDER_SITE_OTHER): Admitting: Physician Assistant

## 2024-08-16 ENCOUNTER — Ambulatory Visit (INDEPENDENT_AMBULATORY_CARE_PROVIDER_SITE_OTHER): Admitting: Physician Assistant
